# Patient Record
Sex: Male | Born: 1935 | Race: White | Hispanic: No | Marital: Married | Smoking: Never smoker
Health system: Southern US, Community
[De-identification: ages and names within clinical notes are randomized; demographics above are authoritative.]

## PROBLEM LIST (undated history)

## (undated) ENCOUNTER — Emergency Department (HOSPITAL_COMMUNITY): Admission: EM | Payer: Medicare (Managed Care) | Source: Home / Self Care

## (undated) DIAGNOSIS — K219 Gastro-esophageal reflux disease without esophagitis: Secondary | ICD-10-CM

## (undated) DIAGNOSIS — G4733 Obstructive sleep apnea (adult) (pediatric): Secondary | ICD-10-CM

## (undated) DIAGNOSIS — I10 Essential (primary) hypertension: Secondary | ICD-10-CM

## (undated) DIAGNOSIS — G20 Parkinson's disease: Secondary | ICD-10-CM

## (undated) DIAGNOSIS — F0390 Unspecified dementia without behavioral disturbance: Secondary | ICD-10-CM

## (undated) DIAGNOSIS — Z972 Presence of dental prosthetic device (complete) (partial): Secondary | ICD-10-CM

## (undated) DIAGNOSIS — B009 Herpesviral infection, unspecified: Secondary | ICD-10-CM

## (undated) DIAGNOSIS — F329 Major depressive disorder, single episode, unspecified: Secondary | ICD-10-CM

## (undated) DIAGNOSIS — R0600 Dyspnea, unspecified: Secondary | ICD-10-CM

## (undated) DIAGNOSIS — Z973 Presence of spectacles and contact lenses: Secondary | ICD-10-CM

## (undated) DIAGNOSIS — G8929 Other chronic pain: Secondary | ICD-10-CM

## (undated) DIAGNOSIS — F32A Depression, unspecified: Secondary | ICD-10-CM

## (undated) DIAGNOSIS — F419 Anxiety disorder, unspecified: Secondary | ICD-10-CM

## (undated) DIAGNOSIS — M1990 Unspecified osteoarthritis, unspecified site: Secondary | ICD-10-CM

## (undated) DIAGNOSIS — J302 Other seasonal allergic rhinitis: Secondary | ICD-10-CM

## (undated) DIAGNOSIS — K759 Inflammatory liver disease, unspecified: Secondary | ICD-10-CM

## (undated) DIAGNOSIS — G250 Essential tremor: Secondary | ICD-10-CM

## (undated) DIAGNOSIS — G709 Myoneural disorder, unspecified: Secondary | ICD-10-CM

## (undated) DIAGNOSIS — E039 Hypothyroidism, unspecified: Secondary | ICD-10-CM

## (undated) DIAGNOSIS — E7800 Pure hypercholesterolemia, unspecified: Secondary | ICD-10-CM

## (undated) DIAGNOSIS — I739 Peripheral vascular disease, unspecified: Secondary | ICD-10-CM

## (undated) HISTORY — DX: Peripheral vascular disease, unspecified: I73.9

## (undated) HISTORY — PX: TONSILLECTOMY: SUR1361

## (undated) HISTORY — PX: APPENDECTOMY: SHX54

## (undated) HISTORY — PX: RETINAL DETACHMENT SURGERY: SHX105

## (undated) HISTORY — PX: ANKLE FRACTURE SURGERY: SHX122

## (undated) HISTORY — PX: BACK SURGERY: SHX140

## (undated) HISTORY — PX: CARDIOVASCULAR STRESS TEST: SHX262

## (undated) HISTORY — DX: Obstructive sleep apnea (adult) (pediatric): G47.33

## (undated) HISTORY — DX: Hypothyroidism, unspecified: E03.9

## (undated) HISTORY — PX: CATARACT EXTRACTION: SUR2

## (undated) HISTORY — DX: Pure hypercholesterolemia, unspecified: E78.00

---

## 2010-08-30 HISTORY — PX: REPLACEMENT TOTAL KNEE: SUR1224

## 2016-02-08 ENCOUNTER — Emergency Department (HOSPITAL_BASED_OUTPATIENT_CLINIC_OR_DEPARTMENT_OTHER): Payer: Medicare Other

## 2016-02-08 ENCOUNTER — Inpatient Hospital Stay (HOSPITAL_BASED_OUTPATIENT_CLINIC_OR_DEPARTMENT_OTHER)
Admission: EM | Admit: 2016-02-08 | Discharge: 2016-02-11 | DRG: 603 | Disposition: A | Discharge status: Home / Self Care | Payer: Medicare Other | Attending: Internal Medicine | Admitting: Internal Medicine

## 2016-02-08 ENCOUNTER — Encounter (HOSPITAL_BASED_OUTPATIENT_CLINIC_OR_DEPARTMENT_OTHER): Payer: Self-pay | Admitting: *Deleted

## 2016-02-08 DIAGNOSIS — I10 Essential (primary) hypertension: Secondary | ICD-10-CM

## 2016-02-08 DIAGNOSIS — Z79899 Other long term (current) drug therapy: Secondary | ICD-10-CM

## 2016-02-08 DIAGNOSIS — F03918 Unspecified dementia, unspecified severity, with other behavioral disturbance: Secondary | ICD-10-CM | POA: Diagnosis present

## 2016-02-08 DIAGNOSIS — G3183 Dementia with Lewy bodies: Secondary | ICD-10-CM

## 2016-02-08 DIAGNOSIS — G20 Parkinson's disease: Secondary | ICD-10-CM | POA: Diagnosis present

## 2016-02-08 DIAGNOSIS — K047 Periapical abscess without sinus: Secondary | ICD-10-CM

## 2016-02-08 DIAGNOSIS — K589 Irritable bowel syndrome without diarrhea: Secondary | ICD-10-CM | POA: Diagnosis present

## 2016-02-08 DIAGNOSIS — G250 Essential tremor: Secondary | ICD-10-CM | POA: Diagnosis present

## 2016-02-08 DIAGNOSIS — F0280 Dementia in other diseases classified elsewhere without behavioral disturbance: Secondary | ICD-10-CM

## 2016-02-08 DIAGNOSIS — L03211 Cellulitis of face: Principal | ICD-10-CM | POA: Diagnosis present

## 2016-02-08 DIAGNOSIS — F0391 Unspecified dementia with behavioral disturbance: Secondary | ICD-10-CM | POA: Diagnosis present

## 2016-02-08 DIAGNOSIS — L0291 Cutaneous abscess, unspecified: Secondary | ICD-10-CM

## 2016-02-08 DIAGNOSIS — L0390 Cellulitis, unspecified: Secondary | ICD-10-CM

## 2016-02-08 HISTORY — DX: Unspecified dementia, unspecified severity, without behavioral disturbance, psychotic disturbance, mood disturbance, and anxiety: F03.90

## 2016-02-08 HISTORY — DX: Essential (primary) hypertension: I10

## 2016-02-08 HISTORY — DX: Unspecified osteoarthritis, unspecified site: M19.90

## 2016-02-08 HISTORY — DX: Parkinson's disease: G20

## 2016-02-08 LAB — CBC WITH DIFFERENTIAL/PLATELET
Basophils Absolute: 0 10*3/uL (ref 0.0–0.1)
Basophils Relative: 0 %
Eosinophils Absolute: 0.2 10*3/uL (ref 0.0–0.7)
Eosinophils Relative: 2 %
HCT: 39.1 % (ref 39.0–52.0)
Hemoglobin: 13.6 g/dL (ref 13.0–17.0)
Lymphocytes Relative: 20 %
Lymphs Abs: 1.7 10*3/uL (ref 0.7–4.0)
MCH: 33.3 pg (ref 26.0–34.0)
MCHC: 34.8 g/dL (ref 30.0–36.0)
MCV: 95.6 fL (ref 78.0–100.0)
Monocytes Absolute: 1.3 10*3/uL — ABNORMAL HIGH (ref 0.1–1.0)
Monocytes Relative: 16 %
Neutro Abs: 5.2 10*3/uL (ref 1.7–7.7)
Neutrophils Relative %: 62 %
Platelets: 194 10*3/uL (ref 150–400)
RBC: 4.09 MIL/uL — ABNORMAL LOW (ref 4.22–5.81)
RDW: 13 % (ref 11.5–15.5)
WBC: 8.4 10*3/uL (ref 4.0–10.5)

## 2016-02-08 LAB — BASIC METABOLIC PANEL
Anion gap: 8 (ref 5–15)
BUN: 12 mg/dL (ref 6–20)
CO2: 27 mmol/L (ref 22–32)
Calcium: 8.8 mg/dL — ABNORMAL LOW (ref 8.9–10.3)
Chloride: 100 mmol/L — ABNORMAL LOW (ref 101–111)
Creatinine, Ser: 0.93 mg/dL (ref 0.61–1.24)
GFR calc Af Amer: >60 mL/min (ref >60)
GFR calc non Af Amer: >60 mL/min (ref >60)
Glucose, Bld: 107 mg/dL — ABNORMAL HIGH (ref 65–99)
Potassium: 3.6 mmol/L (ref 3.5–5.1)
Sodium: 135 mmol/L (ref 135–145)

## 2016-02-08 MED ORDER — ACETAMINOPHEN 325 MG PO TABS
650.0000 mg | ORAL_TABLET | Freq: Once | ORAL | Status: AC
  Administered 2016-02-08: 650 mg via ORAL
  Filled 2016-02-08: qty 2

## 2016-02-08 MED ORDER — SODIUM CHLORIDE 0.9 % IV BOLUS (SEPSIS)
500.0000 mL | Freq: Once | INTRAVENOUS | Status: AC
Start: 2016-02-08 — End: 2016-02-08
  Administered 2016-02-08: 500 mL via INTRAVENOUS

## 2016-02-08 MED ORDER — IOPAMIDOL (ISOVUE-300) INJECTION 61%
100.0000 mL | Freq: Once | INTRAVENOUS | Status: AC | PRN
  Administered 2016-02-08: 100 mL via INTRAVENOUS

## 2016-02-08 MED ORDER — CLINDAMYCIN PHOSPHATE 600 MG/50ML IV SOLN
600.0000 mg | Freq: Once | INTRAVENOUS | Status: AC
  Administered 2016-02-08: 600 mg via INTRAVENOUS
  Filled 2016-02-08: qty 50

## 2016-02-08 NOTE — ED Notes (Signed)
Patient transported to CT 

## 2016-02-08 NOTE — ED Provider Notes (Signed)
CSN: [id number]     Arrival date & time 02/08/16  1624 History  By signing my name below, I, Kenneth Brandt, attest that this documentation has been prepared under the direction and in the presence of [name], MD. Electronically Signed: Soijett Brandt, ED Scribe. 02/08/2016. 5:27 PM.   Chief Complaint  Patient presents with  . Dental Pain      The history is provided by the patient. No language interpreter was used.    Kenneth Brandt is a 81 y.o. male with a medical hx of parkinson dx, dementia, HTN, who presents to the Emergency Department complaining of left lower dental pain onset 2 days. Family reports that for the past couple days the pt has been stating that he doesn't feel well. Denies dental pain like this in the past. Denies any injury or trauma at this time. Pt does have a dentist and was informed that he would need a root canal to his left upper tooth, with his last visit being several weeks ago. He states that he is having associated symptoms of left sided facial swelling and left lower gum swelling/redness. He states that has not tried any medications for the relief for his symptoms. He denies fever, trouble swallowing, SOB, and any other symptoms. Pt denies allergies to any medications.   Past Medical History  Diagnosis Date  . Parkinson disease (HCC)   . Dementia   . Hypertension   . Arthritis    Past Surgical History  Procedure Laterality Date  . Back surgery     Family History  Problem Relation Age of Onset  . Cancer Mother   . CAD Father   . CAD Brother   . Cancer Other   . Stroke Neg Hx    Social History  Substance Use Topics  . Smoking status: Never Smoker   . Smokeless tobacco: None  . Alcohol Use: No    Review of Systems  Constitutional: Negative for fever.  HENT: Positive for dental problem. Negative for trouble swallowing.        Left lower gum swelling/redness  Respiratory: Negative for shortness of breath.   All other systems reviewed and  are negative.   Allergies  Review of patient's allergies indicates no known allergies.  Home Medications   Prior to Admission medications   Medication Sig Start Date End Date Taking? Authorizing Provider  aspirin 81 MG tablet Take 81 mg by mouth daily.   Yes Historical Provider, MD  buPROPion (WELLBUTRIN XL) 150 MG 24 hr tablet Take 150 mg by mouth daily.   Yes Historical Provider, MD  carbidopa-levodopa (SINEMET IR) 10-100 MG tablet Take 1.5 tablets by mouth 3 (three) times daily.   Yes Historical Provider, MD  donepezil (ARICEPT) 10 MG tablet Take 10 mg by mouth at bedtime.   Yes Historical Provider, MD  DULoxetine (CYMBALTA) 60 MG capsule Take 60 mg by mouth 2 (two) times daily.   Yes Historical Provider, MD  hyoscyamine (LEVSIN, ANASPAZ) 0.125 MG tablet Take 0.125 mg by mouth every 6 (six) hours as needed for cramping.   Yes Historical Provider, MD  levothyroxine (SYNTHROID, LEVOTHROID) 75 MCG tablet Take 75 mcg by mouth daily before breakfast.   Yes Historical Provider, MD  meloxicam (MOBIC) 7.5 MG tablet Take 7.5 mg by mouth 2 (two) times daily.   Yes Historical Provider, MD  cyclobenzaprine (FLEXERIL) 10 MG tablet Take 10 mg by mouth 3 (three) times daily as needed for muscle spasms.   Yes Historical Provider, MD  gabapentin (NEURONTIN) 300 MG capsule Take 300 mg by mouth 3 (three) times daily.   Yes Historical Provider, MD  hydrochlorothiazide (HYDRODIURIL) 25 MG tablet Take 25 mg by mouth daily.   Yes Historical Provider, MD  UNKNOWN TO PATIENT    Yes Historical Provider, MD   BP 137/61 mmHg  Pulse 71  Temp(Src) 98.6 F (37 C) (Oral)  Resp 18  Ht 5\' 8"  (1.727 m)  Wt 239 lb 9.6 oz (108.682 kg)  BMI 36.44 kg/m2  SpO2 97% Physical Exam  Constitutional: He is oriented to person, place, and time. He appears well-developed and well-nourished.  HENT:  Head: Normocephalic and atraumatic.  Mouth/Throat:    Moderate left lower facial and jaw swelling and erythema with local  tenderness.   Cardiovascular: Normal rate, regular rhythm and normal heart sounds.   Pulmonary/Chest: Effort normal and breath sounds normal. No respiratory distress.  Abdominal: Soft. There is no tenderness. There is no rebound and no guarding.  Musculoskeletal: He exhibits no edema or tenderness.  Non-pitting edema to bilateral Le  Neurological: He is alert and oriented to person, place, and time.  Skin: Skin is warm and dry.  Psychiatric: He has a normal mood and affect. His behavior is normal.  Nursing note and vitals reviewed.   ED Course  Procedures (including critical care time) DIAGNOSTIC STUDIES: Oxygen Saturation is 95% on RA, nl by my interpretation.    COORDINATION OF CARE: 5:27 PM Discussed treatment plan with pt at bedside which includes labs, IV abx, CT maxillofacial, and pt agreed to plan.    Labs Review Labs Reviewed  BASIC METABOLIC PANEL - Abnormal; Notable for the following:    Chloride 100 (*)    Glucose, Bld 107 (*)    Calcium 8.8 (*)    All other components within normal limits  CBC WITH DIFFERENTIAL/PLATELET - Abnormal; Notable for the following:    RBC 4.09 (*)    Monocytes Absolute 1.3 (*)    All other components within normal limits    Imaging Review Ct Maxillofacial W/cm  02/08/2016  CLINICAL DATA:  Acute onset of left lower dental pain and left-sided facial swelling. Initial encounter. EXAM: CT MAXILLOFACIAL WITH CONTRAST TECHNIQUE: Multidetector CT imaging of the maxillofacial structures was performed with intravenous contrast. Multiplanar CT image reconstructions were also generated. A small metallic BB was placed on the right temple in order to reliably differentiate right from left. CONTRAST:  100mL ISOVUE-300 IOPAMIDOL (ISOVUE-300) INJECTION 61% COMPARISON:  None. FINDINGS: Mild soft tissue swelling is noted overlying the left side of the face, tracking about the left mandible, with thickening of the left platysma. There appears to be a small  associated soft tissue abscess at the left central mandible, measuring approximately 9 x 5 mm, which corresponds to a 1.1 cm periapical abscess at the left central mandible, underlying the root of the single remaining left mandibular tooth. Minimal cortical breakthrough is noted. The other remaining teeth are grossly unremarkable in appearance, without evidence of additional periapical abscess. There is chronic absence of much of the dentition. There is no evidence of fracture or dislocation. The maxilla and mandible appear intact. The nasal bone is unremarkable in appearance. There is grade 1 anterolisthesis of C3 on C4, reflecting underlying facet disease. Multilevel disc space narrowing is noted along the lower cervical spine, with scattered anterior and posterior disc osteophyte complexes. There is chronic flattening of the mandibular condylar heads bilaterally, reflecting chronic temporomandibular joint disease. Postoperative change is noted at the right  optic globe. The orbits are otherwise unremarkable. The visualized paranasal sinuses and mastoid air cells are well-aerated. Minimal calcification is noted at the right carotid bifurcation. The parapharyngeal fat planes are preserved. The nasopharynx, oropharynx and hypopharynx are unremarkable in appearance. The visualized portions of the valleculae and piriform sinuses are grossly unremarkable. The parotid and submandibular glands are within normal limits. No cervical lymphadenopathy is seen. The visualized portions of the brain are grossly unremarkable in appearance. IMPRESSION: 1. Small soft tissue abscess at the left central mandible, measuring 9 x 5 mm, corresponding to a 1.1 cm periapical abscess at the left central mandible, underlying the root of the single remaining left mandibular tooth. Minimal cortical breakthrough noted at the periapical abscess. 2. Associated mild soft tissue swelling along the left side of the face, tracking about the left  mandible, with thickening of the left platysma. 3. Mild degenerative change along the cervical spine. 4. Chronic flattening of the mandibular condylar heads bilaterally, reflecting chronic bilateral temporomandibular joint disease. 5. Minimal calcification at the right carotid bifurcation. Electronically Signed   By: Roanna Raider M.D.   On: 02/08/2016 19:11   I have personally reviewed and evaluated these images and lab results as part of my medical decision-making.   EKG Interpretation None      MDM   Final diagnoses:  Dental abscess  Facial cellulitis   Pt here for evaluation of facial pain and swelling.  He has malaise at home but no reports of fevers.  Exam with facial cellulitis and spontaneously draining dental abscess.  CT scan with similar findings.  He was started on Clindamycin in the ED.  Given extent of facial swelling and age plan to admit for IV antibiotics until symptoms improve.  Pt in agreement with plan.  Hospitalist consulted for admission.    I personally performed the services described in this documentation, which was scribed in my presence. The recorded information has been reviewed and is accurate.    [name], MD 02/09/16 1221

## 2016-02-08 NOTE — ED Notes (Signed)
Dental pain bottom left side of jaw.  Swelling and redness noted.

## 2016-02-08 NOTE — Plan of Care (Signed)
81 yo m hx of parkinson HTN 3 days of facial pain Right facial cellulitis CT showing peri-epical abscess Plant to admit for IV antibiotics.  Does not look toxic  given clindamycin 600 IV Accepted to med surge bed  Kenneth Brandt  8:50 PM

## 2016-02-08 NOTE — H&P (Signed)
Kenneth Brandt ZOX:[id number] DOB: 06/16/1935 DOA: 02/08/2016     PCP: Pcp Not In System   Outpatient Specialists: none Patient coming from:   home Lives   With family   Chief Complaint: Left side jaw pain  HPI: Kenneth Brandt is a 81 y.o. male with medical history significant of hypertension, mild dementia, Parkinson disease,  chronic diarrhea  Presented with redness and swelling of left side of her face since 3days. Patient have had toothache there was seen by dentist who felt that his 2 did not appear to be infected that time there was 2 weeks ago. He did not appreciate any drainage. No fevers some chills Feels overall unwell. His face started to swell he has not tried any medications to help him. No back trouble swallowing or shortness of breath no chest pain no nausea vomiting or diarrhea  Family noticed some lower extremity edema has been chronic. Patient has chronic diarrhea secondary to IBS he is on Imodium as needed  Regarding pertinent Chronic problems: History of hypertension   IN ER: Presented to [facility] [place] Afebrile, heart rate 71 blood pressure 3761 WBC 8.4 hemoglobin 13.6 creatinine 0.93 CT  Face showing small soft tissue abscess left central mandible corresponding to 1.1 cm. Atypical abscess in the left central mandible associated and mild left soft tissue swelling over the left side of the face  Started on clindamycin  Hospitalist was called for admission for facial cellulitis and peridental abscess  Review of Systems:    Pertinent positives include: Facial pain and swelling fatigue,  Constitutional:  No weight loss, night sweats, Fevers, chills, weight loss  HEENT:  No headaches, Difficulty swallowing,Tooth/dental problems,Sore throat,  No sneezing, itching, ear ache, nasal congestion, post nasal drip,  Cardio-vascular:  No chest pain, Orthopnea, PND, anasarca, dizziness, palpitations.no Bilateral lower extremity swelling  GI:  No heartburn,  indigestion, abdominal pain, nausea, vomiting, diarrhea, change in bowel habits, loss of appetite, melena, blood in stool, hematemesis Resp:  no shortness of breath at rest. No dyspnea on exertion, No excess mucus, no productive cough, No non-productive cough, No coughing up of blood.No change in color of mucus.No wheezing. Skin:  no rash or lesions. No jaundice GU:  no dysuria, change in color of urine, no urgency or frequency. No straining to urinate.  No flank pain.  Musculoskeletal:  No joint pain or no joint swelling. No decreased range of motion. No back pain.  Psych:  No change in mood or affect. No depression or anxiety. No memory loss.  Neuro: no localizing neurological complaints, no tingling, no weakness, no double vision, no gait abnormality, no slurred speech, no confusion  As per HPI otherwise 10 point review of systems negative.   Past Medical History: Past Medical History  Diagnosis Date  . Parkinson disease (HCC)   . Dementia   . Hypertension   . Arthritis    Past Surgical History  Procedure Laterality Date  . Back surgery       Social History:  Ambulatory   independently      reports that he has never smoked. He does not have any smokeless tobacco history on file. He reports that he does not drink alcohol or use illicit drugs.  Allergies:  No Known Allergies     Family History:    Family History  Problem Relation Age of Onset  . Cancer Mother   . CAD Father   . CAD Brother   . Cancer Other   .  Stroke Neg Hx     Medications: Prior to Admission medications   Medication Sig Start Date End Date Taking? Authorizing Provider  cyclobenzaprine (FLEXERIL) 10 MG tablet Take 10 mg by mouth 3 (three) times daily as needed for muscle spasms.   Yes Historical Provider, MD  gabapentin (NEURONTIN) 300 MG capsule Take 300 mg by mouth 3 (three) times daily.   Yes Historical Provider, MD  hydrochlorothiazide (HYDRODIURIL) 25 MG tablet Take 25 mg by mouth  daily.   Yes Historical Provider, MD  UNKNOWN TO PATIENT    Yes Historical Provider, MD    Physical Exam: Patient Vitals for the past 24 hrs:  BP Temp Temp src Pulse Resp SpO2 Height Weight  02/08/16 2256 137/61 mmHg 98.6 F (37 C) Oral 71 18 97 % 5\' 8"  (1.727 m) 108.682 kg (239 lb 9.6 oz)  02/08/16 2130 136/91 mmHg - - 70 - 95 % - -  02/08/16 2115 148/85 mmHg - - 71 - 95 % - -  02/08/16 2100 143/83 mmHg - - 69 - 94 % - -  02/08/16 2045 132/79 mmHg - - 69 - 95 % - -  02/08/16 2010 147/81 mmHg - - 75 20 95 % - -  02/08/16 1802 142/85 mmHg 98.5 F (36.9 C) Oral 70 20 94 % - -  02/08/16 1631 145/78 mmHg 99.4 F (37.4 C) Oral 82 20 95 % 5\' 8"  (1.727 m) 104.327 kg (230 lb)    1. General:  in No Acute distress 2. Psychological: Alert and   Oriented 3. Head/ENT:     Dry Mucous Membranes Swelling in the gum area and left face                          Head Non traumatic, neck supple                           Poor Dentition 4. SKIN:   decreased Skin turgor,  Skin clean Dry and intact no rash 5. Heart: Regular rate and rhythm no Murmur, Rub or gallop 6. Lungs:  Clear to auscultation bilaterally, no wheezes or crackles   7. Abdomen: Soft, non-tender, Non distended 8. Lower extremities: no clubbing, cyanosis, trace edema 9. Neurologically Grossly intact, moving all 4 extremities equally 10. MSK: Normal range of motion   body mass index is 36.44 kg/(m^2).  Labs on Admission:   Labs on Admission: I have personally reviewed following labs and imaging studies  CBC:  Recent Labs Lab 02/08/16 1745  WBC 8.4  NEUTROABS 5.2  HGB 13.6  HCT 39.1  MCV 95.6  PLT 194   Basic Metabolic Panel:  Recent Labs Lab 02/08/16 1745  NA 135  K 3.6  CL 100*  CO2 27  GLUCOSE 107*  BUN 12  CREATININE 0.93  CALCIUM 8.8*   GFR: Estimated Creatinine Clearance: 78.2 mL/min (by C-G formula based on Cr of 0.93). Liver Function Tests: No results for input(s): AST, ALT, ALKPHOS, BILITOT, PROT,  ALBUMIN in the last 168 hours. No results for input(s): LIPASE, AMYLASE in the last 168 hours. No results for input(s): AMMONIA in the last 168 hours. Coagulation Profile: No results for input(s): INR, PROTIME in the last 168 hours. Cardiac Enzymes: No results for input(s): CKTOTAL, CKMB, CKMBINDEX, TROPONINI in the last 168 hours. BNP (last 3 results) No results for input(s): PROBNP in the last 8760 hours. HbA1C: No results for input(s): HGBA1C  in the last 72 hours. CBG: No results for input(s): GLUCAP in the last 168 hours. Lipid Profile: No results for input(s): CHOL, HDL, LDLCALC, TRIG, CHOLHDL, LDLDIRECT in the last 72 hours. Thyroid Function Tests: No results for input(s): TSH, T4TOTAL, FREET4, T3FREE, THYROIDAB in the last 72 hours. Anemia Panel: No results for input(s): VITAMINB12, FOLATE, FERRITIN, TIBC, IRON, RETICCTPCT in the last 72 hours. Urine analysis: No results found for: COLORURINE, APPEARANCEUR, LABSPEC, PHURINE, GLUCOSEU, HGBUR, BILIRUBINUR, KETONESUR, PROTEINUR, UROBILINOGEN, NITRITE, LEUKOCYTESUR Sepsis Labs: @LABRCNTIP (procalcitonin:4,lacticidven:4) )No results found for this or any previous visit (from the past 240 hour(s)).   UA Not obtained  No results found for: HGBA1C  Estimated Creatinine Clearance: 78.2 mL/min (by C-G formula based on Cr of 0.93).  BNP (last 3 results) No results for input(s): PROBNP in the last 8760 hours.   ECG REPORT Not obtained  [facility] LL[facility] Weights   02/08/16 1631 02/08/16 2256  Weight: 104.327 kg (230 lb) 108.682 kg (239 lb 9.6 oz)     Cultures: No results found for: SDES, SPECREQUEST, CULT, REPTSTATUS   Radiological Exams on Admission: Ct Maxillofacial W/cm  02/08/2016  CLINICAL DATA:  Acute onset of left lower dental pain and left-sided facial swelling. Initial encounter. EXAM: CT MAXILLOFACIAL WITH CONTRAST TECHNIQUE: Multidetector CT imaging of the maxillofacial structures was performed with intravenous contrast.  Multiplanar CT image reconstructions were also generated. A small metallic BB was placed on the right temple in order to reliably differentiate right from left. CONTRAST:  100mL ISOVUE-300 IOPAMIDOL (ISOVUE-300) INJECTION 61% COMPARISON:  None. FINDINGS: Mild soft tissue swelling is noted overlying the left side of the face, tracking about the left mandible, with thickening of the left platysma. There appears to be a small associated soft tissue abscess at the left central mandible, measuring approximately 9 x 5 mm, which corresponds to a 1.1 cm periapical abscess at the left central mandible, underlying the root of the single remaining left mandibular tooth. Minimal cortical breakthrough is noted. The other remaining teeth are grossly unremarkable in appearance, without evidence of additional periapical abscess. There is chronic absence of much of the dentition. There is no evidence of fracture or dislocation. The maxilla and mandible appear intact. The nasal bone is unremarkable in appearance. There is grade 1 anterolisthesis of C3 on C4, reflecting underlying facet disease. Multilevel disc space narrowing is noted along the lower cervical spine, with scattered anterior and posterior disc osteophyte complexes. There is chronic flattening of the mandibular condylar heads bilaterally, reflecting chronic temporomandibular joint disease. Postoperative change is noted at the right optic globe. The orbits are otherwise unremarkable. The visualized paranasal sinuses and mastoid air cells are well-aerated. Minimal calcification is noted at the right carotid bifurcation. The parapharyngeal fat planes are preserved. The nasopharynx, oropharynx and hypopharynx are unremarkable in appearance. The visualized portions of the valleculae and piriform sinuses are grossly unremarkable. The parotid and submandibular glands are within normal limits. No cervical lymphadenopathy is seen. The visualized portions of the brain are grossly  unremarkable in appearance. IMPRESSION: 1. Small soft tissue abscess at the left central mandible, measuring 9 x 5 mm, corresponding to a 1.1 cm periapical abscess at the left central mandible, underlying the root of the single remaining left mandibular tooth. Minimal cortical breakthrough noted at the periapical abscess. 2. Associated mild soft tissue swelling along the left side of the face, tracking about the left mandible, with thickening of the left platysma. 3. Mild degenerative change along the cervical spine. 4. Chronic flattening of the  mandibular condylar heads bilaterally, reflecting chronic bilateral temporomandibular joint disease. 5. Minimal calcification at the right carotid bifurcation. Electronically Signed   By: Roanna Raider M.D.   On: 02/08/2016 19:11    Chart has been reviewed    Assessment/Plan   81 y.o. male with medical history significant of hypertension, mild dementia, Parkinson disease, here with peridental abscess resulting in facial cellulitis  Present on Admission:  . Facial cellulitis - continue clindamycin to cover anaerobes started an emergency department will need dental follow-up after discharge tooth extraction  Chronic diarrhea - due to IBS.stable if increase in stool quality or quantity of suspects that this would test prior to give Imodium . Dementia without  behavioral disturbance stable continue Aricept . Parkinson disease (HCC) continue home medications and unsure of the dose family will bring medications tomorrow. For now start Sinemet 10- 100 1 and 1/2 tablets. To avoid Parkinson symptoms. We'll need to adjust medications 1 patient family confirms current dose currently stable  . Essential hypertension continue hydrochlorothiazide Mild lower extremity edema no shortness of breath or orthopnea - will check BNP Medications currently stable  Other plan as per orders.  DVT prophylaxis:    Lovenox     Code Status:  FULL CODE as per patient    Family  Communication:   Family   at  Bedside  plan of care was discussed with  Daughter,   Elmo Putt [phone]  Disposition Plan:     To home once workup is complete and patient is stable   Consults called: none   Admission status:  Inpatient     Level of care   medical floor        Baylen Dea 02/09/2016, 12:44 AM    [facility]  Pager [phone]   after 2 AM please page floor coverage PA If 7AM-7PM, please contact the day team taking care of the patient  [email or web address]  Password [email or web address]

## 2016-02-08 NOTE — ED Notes (Signed)
Pt and family describe redness and swelling to left side of face since yesterday. Tender to touch. No drainage "that I'm aware of".

## 2016-02-08 NOTE — ED Notes (Signed)
No changes [facility] here.

## 2016-02-09 ENCOUNTER — Encounter (HOSPITAL_COMMUNITY): Payer: Self-pay | Admitting: Internal Medicine

## 2016-02-09 DIAGNOSIS — L03211 Cellulitis of face: Secondary | ICD-10-CM | POA: Diagnosis not present

## 2016-02-09 DIAGNOSIS — L0291 Cutaneous abscess, unspecified: Secondary | ICD-10-CM | POA: Diagnosis not present

## 2016-02-09 LAB — COMPREHENSIVE METABOLIC PANEL
ALT: 13 U/L — ABNORMAL LOW (ref 17–63)
AST: 15 U/L (ref 15–41)
Albumin: 3 g/dL — ABNORMAL LOW (ref 3.5–5.0)
Alkaline Phosphatase: 62 U/L (ref 38–126)
Anion gap: 7 (ref 5–15)
BUN: 10 mg/dL (ref 6–20)
CO2: 28 mmol/L (ref 22–32)
Calcium: 8.8 mg/dL — ABNORMAL LOW (ref 8.9–10.3)
Chloride: 101 mmol/L (ref 101–111)
Creatinine, Ser: 0.86 mg/dL (ref 0.61–1.24)
GFR calc Af Amer: >60 mL/min (ref >60)
GFR calc non Af Amer: >60 mL/min (ref >60)
Glucose, Bld: 96 mg/dL (ref 65–99)
Potassium: 3.5 mmol/L (ref 3.5–5.1)
Sodium: 136 mmol/L (ref 135–145)
Total Bilirubin: 0.7 mg/dL (ref 0.3–1.2)
Total Protein: 6.3 g/dL — ABNORMAL LOW (ref 6.5–8.1)

## 2016-02-09 LAB — CBC
HCT: 37.8 % — ABNORMAL LOW (ref 39.0–52.0)
Hemoglobin: 12.6 g/dL — ABNORMAL LOW (ref 13.0–17.0)
MCH: 32 pg (ref 26.0–34.0)
MCHC: 33.3 g/dL (ref 30.0–36.0)
MCV: 95.9 fL (ref 78.0–100.0)
Platelets: 180 10*3/uL (ref 150–400)
RBC: 3.94 MIL/uL — ABNORMAL LOW (ref 4.22–5.81)
RDW: 13.4 % (ref 11.5–15.5)
WBC: 8.3 10*3/uL (ref 4.0–10.5)

## 2016-02-09 LAB — TSH: TSH: 1.845 u[IU]/mL (ref 0.350–4.500)

## 2016-02-09 LAB — MAGNESIUM: Magnesium: 1.8 mg/dL (ref 1.7–2.4)

## 2016-02-09 LAB — PHOSPHORUS: Phosphorus: 3.4 mg/dL (ref 2.5–4.6)

## 2016-02-09 LAB — BRAIN NATRIURETIC PEPTIDE: B Natriuretic Peptide: 109.6 pg/mL — ABNORMAL HIGH (ref 0.0–100.0)

## 2016-02-09 MED ORDER — GABAPENTIN 300 MG PO CAPS
300.0000 mg | ORAL_CAPSULE | Freq: Three times a day (TID) | ORAL | Status: DC
  Administered 2016-02-09 – 2016-02-11 (×7): 300 mg via ORAL
  Filled 2016-02-09 (×7): qty 1

## 2016-02-09 MED ORDER — ACETAMINOPHEN 325 MG PO TABS: 650.0000 mg | ORAL_TABLET | Freq: Four times a day (QID) | ORAL | Status: DC | PRN

## 2016-02-09 MED ORDER — ENOXAPARIN SODIUM 40 MG/0.4ML ~~LOC~~ SOLN
40.0000 mg | SUBCUTANEOUS | Status: DC
  Administered 2016-02-09 – 2016-02-10 (×2): 40 mg via SUBCUTANEOUS
  Filled 2016-02-09 (×2): qty 0.4

## 2016-02-09 MED ORDER — ACETAMINOPHEN 650 MG RE SUPP: 650.0000 mg | Freq: Four times a day (QID) | RECTAL | Status: DC | PRN

## 2016-02-09 MED ORDER — SODIUM CHLORIDE 0.9% FLUSH: 3.0000 mL | INTRAVENOUS | Status: DC | PRN

## 2016-02-09 MED ORDER — SODIUM CHLORIDE 0.9% FLUSH
3.0000 mL | Freq: Two times a day (BID) | INTRAVENOUS | Status: DC
  Administered 2016-02-09 – 2016-02-11 (×3): 3 mL via INTRAVENOUS

## 2016-02-09 MED ORDER — HYDROCHLOROTHIAZIDE 25 MG PO TABS
25.0000 mg | ORAL_TABLET | Freq: Every day | ORAL | Status: DC
  Administered 2016-02-09 – 2016-02-11 (×3): 25 mg via ORAL
  Filled 2016-02-09 (×3): qty 1

## 2016-02-09 MED ORDER — ONDANSETRON HCL 4 MG PO TABS: 4.0000 mg | ORAL_TABLET | Freq: Four times a day (QID) | ORAL | Status: DC | PRN

## 2016-02-09 MED ORDER — BUPROPION HCL ER (XL) 150 MG PO TB24
150.0000 mg | ORAL_TABLET | Freq: Every day | ORAL | Status: DC
  Administered 2016-02-09 – 2016-02-10 (×2): 150 mg via ORAL
  Filled 2016-02-09 (×2): qty 1

## 2016-02-09 MED ORDER — CLINDAMYCIN PHOSPHATE 600 MG/50ML IV SOLN
600.0000 mg | Freq: Four times a day (QID) | INTRAVENOUS | Status: DC
  Administered 2016-02-09 – 2016-02-11 (×10): 600 mg via INTRAVENOUS
  Filled 2016-02-09 (×12): qty 50

## 2016-02-09 MED ORDER — ASPIRIN EC 81 MG PO TBEC
81.0000 mg | DELAYED_RELEASE_TABLET | Freq: Every day | ORAL | Status: DC
  Administered 2016-02-09 – 2016-02-11 (×3): 81 mg via ORAL
  Filled 2016-02-09 (×3): qty 1

## 2016-02-09 MED ORDER — ONDANSETRON HCL 4 MG/2ML IJ SOLN: 4.0000 mg | Freq: Four times a day (QID) | INTRAMUSCULAR | Status: DC | PRN

## 2016-02-09 MED ORDER — HYDROCODONE-ACETAMINOPHEN 5-325 MG PO TABS
1.0000 | ORAL_TABLET | ORAL | Status: DC | PRN
  Administered 2016-02-09 – 2016-02-11 (×10): 2 via ORAL
  Filled 2016-02-09 (×10): qty 2

## 2016-02-09 MED ORDER — CYCLOBENZAPRINE HCL 10 MG PO TABS: 10.0000 mg | ORAL_TABLET | Freq: Three times a day (TID) | ORAL | Status: DC | PRN

## 2016-02-09 MED ORDER — SODIUM CHLORIDE 0.9 % IV SOLN: 250.0000 mL | INTRAVENOUS | Status: DC | PRN

## 2016-02-09 MED ORDER — DULOXETINE HCL 60 MG PO CPEP
60.0000 mg | ORAL_CAPSULE | Freq: Two times a day (BID) | ORAL | Status: DC
  Administered 2016-02-09 – 2016-02-11 (×5): 60 mg via ORAL
  Filled 2016-02-09 (×5): qty 1

## 2016-02-09 MED ORDER — CARBIDOPA-LEVODOPA 10-100 MG PO TABS
1.5000 | ORAL_TABLET | Freq: Three times a day (TID) | ORAL | Status: DC
  Administered 2016-02-09 – 2016-02-11 (×7): 1.5 via ORAL
  Filled 2016-02-09 (×8): qty 1.5

## 2016-02-09 MED ORDER — LEVOTHYROXINE SODIUM 75 MCG PO TABS
75.0000 ug | ORAL_TABLET | Freq: Every day | ORAL | Status: DC
  Administered 2016-02-09 – 2016-02-11 (×3): 75 ug via ORAL
  Filled 2016-02-09 (×3): qty 1

## 2016-02-09 MED ORDER — DONEPEZIL HCL 10 MG PO TABS
10.0000 mg | ORAL_TABLET | Freq: Every day | ORAL | Status: DC
  Administered 2016-02-09 – 2016-02-10 (×3): 10 mg via ORAL
  Filled 2016-02-09 (×3): qty 1

## 2016-02-09 NOTE — Progress Notes (Signed)
Pt. Washed body and face

## 2016-02-09 NOTE — Progress Notes (Signed)
Offered Pt. A bath and Pt. Stated Kenneth Brandt would get one later on and as well the linens

## 2016-02-09 NOTE — Progress Notes (Signed)
PROGRESS NOTE    Kenneth Brandt  ZOX:[id number] DOB: July 24, 1935 DOA: 02/08/2016 PCP: Pcp Not In System (Confirm with patient/family/NH records and if not entered, this HAS to be entered at [facility] point of entry. "No PCP" if truly none.)   Brief Narrative: (Start on day 1 of progress note - keep it brief and live)  Left facial cellulitis associated with periodontal infection, 81 yo male, with parkinson and htn. Clinically improving cellulitis.    Assessment & Plan:   Active Problems:   Facial cellulitis   Dental abscess   Dementia with behavioral disturbance   Parkinson disease (HCC)   Essential hypertension   1. Cardiovascular. Patient hemodynamic stable, will continue antibiotic therapy with IV clindamycin, patient has remained afebrile with systolic blood pressure 136 and heart rate 71.   2. Pulmonary. Patient oxygenating well, no signs of volume overload, will continue to monitor oxymetry and give supplemental 02 per [place] to target 02 sat above 92%. CT with 9X5 mm soft tissue abscess at the left central mandible.  No signs of airway compromise. Will follow with imaging in 24 hours.   3. Nephrology. Renal function with cr at o.86 with K at 3.5 will follow on renal panel in am, avoid hypotension and nephrotoxic medications.  4. Neurology. Patient awake and alert no signs of confusion or agitation, will continue    DVT prophylaxis: (Lovenox/Heparin/SCD's/anticoagulated/None (if comfort care) Code Status: (Full/Partial - specify details) Family Communication: (Specify name, relationship & date discussed. NO "discussed with patient") Disposition Plan: (specify when and where you expect patient to be discharged). Include barriers to DC in this tab.   Consultants:     Procedures: (Don't include imaging studies which can be auto populated. Include things that cannot be auto populated i.e. Echo, Carotid and venous dopplers, Foley, Bipap, HD, tubes/drains, wound vac, central lines  etc)    Antimicrobials: (specify start and planned stop date. Auto populated tables are space occupying and do not give end dates)  clindamycin   Subjective: Right submandibular pain and erythema with mild improvement, no dysphagia or odinophagia, no nausea or vomiting. No chest pain or dyspnea. Tolerating po well.   Objective: Filed Vitals:   02/08/16 2115 02/08/16 2130 02/08/16 2256 02/09/16 0405  BP: 148/85 136/91 137/61 135/77  Pulse: 71 70 71 64  Temp:   98.6 F (37 C) 98.1 F (36.7 C)  TempSrc:   Oral Oral  Resp:   18 20  Height:    (1.727 m)   Weight:   108.682 kg (239 lb 9.6 oz)   SpO2: 95% 95% 97% 93%    Intake/Output Summary (Last 24 hours) at 02/09/16 1124 Last data filed at 02/09/16 0915  Gross per 24 hour  Intake   1060 ml  Output    600 ml  Net    460 ml   Filed Weights   02/08/16 1631 02/08/16 2256  Weight: 104.327 kg (230 lb) 108.682 kg (239 lb 9.6 oz)    Examination:  General exam: not in pain. E ENT: no conjunctival pallor or icterus. Oral mucosa moist, no thrush or ulcers. Neck. Positive edema and erythema at the right submandibular region, ill defined margins, about 5 to 7 cm in diameter, oval type lesion. Respiratory system: Clear to auscultation. Respiratory effort normal. No wheezing, rales or rhonchi Cardiovascular system: S1 & S2 heard, RRR. No JVD, murmurs, rubs, gallops or clicks. No pedal edema. Gastrointestinal system: Abdomen is nondistended, soft and nontender. No organomegaly or masses felt. Normal  bowel sounds heard. Central nervous system: Alert and oriented. No focal neurological deficits. Extremities: Symmetric 5 x 5 power. Skin: rash on the right submandibular region as above. No other rashes. Psychiatry: Judgement and insight appear normal. Mood & affect appropriate.     Data Reviewed: I have personally reviewed following labs and imaging studies  CBC:  Recent Labs Lab 02/08/16 1745 02/09/16 0420  WBC 8.4 8.3   NEUTROABS 5.2  --   HGB 13.6 12.6*  HCT 39.1 37.8*  MCV 95.6 95.9  PLT 194 180   Basic Metabolic Panel:  Recent Labs Lab 02/08/16 1745 02/09/16 0420  NA 135 136  K 3.6 3.5  CL 100* 101  CO2 27 28  GLUCOSE 107* 96  BUN 12 10  CREATININE 0.93 0.86  CALCIUM 8.8* 8.8*  MG  --  1.8  PHOS  --  3.4   GFR: Estimated Creatinine Clearance: 84.6 mL/min (by C-G formula based on Cr of 0.86). Liver Function Tests:  Recent Labs Lab 02/09/16 0420  AST 15  ALT 13*  ALKPHOS 62  BILITOT 0.7  PROT 6.3*  ALBUMIN 3.0*   No results for input(s): LIPASE, AMYLASE in the last 168 hours. No results for input(s): AMMONIA in the last 168 hours. Coagulation Profile: No results for input(s): INR, PROTIME in the last 168 hours. Cardiac Enzymes: No results for input(s): CKTOTAL, CKMB, CKMBINDEX, TROPONINI in the last 168 hours. BNP (last 3 results) No results for input(s): PROBNP in the last 8760 hours. HbA1C: No results for input(s): HGBA1C in the last 72 hours. CBG: No results for input(s): GLUCAP in the last 168 hours. Lipid Profile: No results for input(s): CHOL, HDL, LDLCALC, TRIG, CHOLHDL, LDLDIRECT in the last 72 hours. Thyroid Function Tests:  Recent Labs  02/09/16 0420  TSH 1.845   Anemia Panel: No results for input(s): VITAMINB12, FOLATE, FERRITIN, TIBC, IRON, RETICCTPCT in the last 72 hours. Sepsis Labs: No results for input(s): PROCALCITON, LATICACIDVEN in the last 168 hours.  No results found for this or any previous visit (from the past 240 hour(s)).       Radiology Studies: Ct Maxillofacial W/cm  02/08/2016  CLINICAL DATA:  Acute onset of left lower dental pain and left-sided facial swelling. Initial encounter. EXAM: CT MAXILLOFACIAL WITH CONTRAST TECHNIQUE: Multidetector CT imaging of the maxillofacial structures was performed with intravenous contrast. Multiplanar CT image reconstructions were also generated. A small metallic BB was placed on the right temple  in order to reliably differentiate right from left. CONTRAST:  100mL ISOVUE-300 IOPAMIDOL (ISOVUE-300) INJECTION 61% COMPARISON:  None. FINDINGS: Mild soft tissue swelling is noted overlying the left side of the face, tracking about the left mandible, with thickening of the left platysma. There appears to be a small associated soft tissue abscess at the left central mandible, measuring approximately 9 x 5 mm, which corresponds to a 1.1 cm periapical abscess at the left central mandible, underlying the root of the single remaining left mandibular tooth. Minimal cortical breakthrough is noted. The other remaining teeth are grossly unremarkable in appearance, without evidence of additional periapical abscess. There is chronic absence of much of the dentition. There is no evidence of fracture or dislocation. The maxilla and mandible appear intact. The nasal bone is unremarkable in appearance. There is grade 1 anterolisthesis of C3 on C4, reflecting underlying facet disease. Multilevel disc space narrowing is noted along the lower cervical spine, with scattered anterior and posterior disc osteophyte complexes. There is chronic flattening of the mandibular condylar  heads bilaterally, reflecting chronic temporomandibular joint disease. Postoperative change is noted at the right optic globe. The orbits are otherwise unremarkable. The visualized paranasal sinuses and mastoid air cells are well-aerated. Minimal calcification is noted at the right carotid bifurcation. The parapharyngeal fat planes are preserved. The nasopharynx, oropharynx and hypopharynx are unremarkable in appearance. The visualized portions of the valleculae and piriform sinuses are grossly unremarkable. The parotid and submandibular glands are within normal limits. No cervical lymphadenopathy is seen. The visualized portions of the brain are grossly unremarkable in appearance. IMPRESSION: 1. Small soft tissue abscess at the left central mandible, measuring  9 x 5 mm, corresponding to a 1.1 cm periapical abscess at the left central mandible, underlying the root of the single remaining left mandibular tooth. Minimal cortical breakthrough noted at the periapical abscess. 2. Associated mild soft tissue swelling along the left side of the face, tracking about the left mandible, with thickening of the left platysma. 3. Mild degenerative change along the cervical spine. 4. Chronic flattening of the mandibular condylar heads bilaterally, reflecting chronic bilateral temporomandibular joint disease. 5. Minimal calcification at the right carotid bifurcation. Electronically Signed   By: Roanna Raider M.D.   On: 02/08/2016 19:11        Scheduled Meds: . aspirin EC  81 mg Oral Daily  . buPROPion  150 mg Oral Daily  . carbidopa-levodopa  1.5 tablet Oral TID  . clindamycin (CLEOCIN) IV  600 mg Intravenous Q6H  . donepezil  10 mg Oral QHS  . DULoxetine  60 mg Oral BID  . enoxaparin (LOVENOX) injection  40 mg Subcutaneous Q24H  . gabapentin  300 mg Oral TID  . hydrochlorothiazide  25 mg Oral Daily  . levothyroxine  75 mcg Oral QAC breakfast  . sodium chloride flush  3 mL Intravenous Q12H   Continuous Infusions:    LOS: 1 day        Allyna Pittsley Annett Gula, MD [facility] Pager [phone]  If 7PM-7AM, please contact night-coverage [email or web address] Password [email or web address] 02/09/2016, 11:24 AM

## 2016-02-10 ENCOUNTER — Inpatient Hospital Stay (HOSPITAL_COMMUNITY): Payer: Medicare Other

## 2016-02-10 DIAGNOSIS — L03211 Cellulitis of face: Secondary | ICD-10-CM | POA: Diagnosis not present

## 2016-02-10 LAB — CBC WITH DIFFERENTIAL/PLATELET
Basophils Absolute: 0 10*3/uL (ref 0.0–0.1)
Basophils Relative: 0 %
Eosinophils Absolute: 0.2 10*3/uL (ref 0.0–0.7)
Eosinophils Relative: 3 %
HCT: 39.6 % (ref 39.0–52.0)
Hemoglobin: 13.1 g/dL (ref 13.0–17.0)
Lymphocytes Relative: 21 %
Lymphs Abs: 1.7 10*3/uL (ref 0.7–4.0)
MCH: 31.6 pg (ref 26.0–34.0)
MCHC: 33.1 g/dL (ref 30.0–36.0)
MCV: 95.7 fL (ref 78.0–100.0)
Monocytes Absolute: 0.7 10*3/uL (ref 0.1–1.0)
Monocytes Relative: 9 %
Neutro Abs: 5.5 10*3/uL (ref 1.7–7.7)
Neutrophils Relative %: 67 %
Platelets: 187 10*3/uL (ref 150–400)
RBC: 4.14 MIL/uL — ABNORMAL LOW (ref 4.22–5.81)
RDW: 13 % (ref 11.5–15.5)
WBC: 8.1 10*3/uL (ref 4.0–10.5)

## 2016-02-10 LAB — HEMOGLOBIN A1C
Hgb A1c MFr Bld: 5.9 % — ABNORMAL HIGH (ref 4.8–5.6)
Mean Plasma Glucose: 123 mg/dL

## 2016-02-10 NOTE — Progress Notes (Signed)
PROGRESS NOTE    Kenneth Brandt  ZOX:[id number] DOB: 30-Jul-1935 DOA: 02/08/2016 PCP: Pcp Not In System (Confirm with patient/family/NH records and if not entered, this HAS to be entered at [facility] point of entry. "No PCP" if truly none.)   Brief Narrative: (Start on day 1 of progress note - keep it brief and live) Left facial cellulitis associated with periodontal infection, 81 yo male, with parkinson and htn. Clinically improving cellulitis.    Assessment & Plan:   Active Problems:   Facial cellulitis   Dental abscess   Dementia with behavioral disturbance   Parkinson disease (HCC)   Essential hypertension   1. Cardiovascular. Patient has bee afebrile, will continue IV clindamycin, clinically rash and edema improving, will follow on [place] to rule out any worsening of soft tissue 5 mm abscess. Will follow with cell count, and temp. Curve.  2. Pulmonary. Will continue to monitor oxymetry, continue supplemental 02 as needed, no signs of upper airway compromise.   3. Nephrology. Patient tolerating po well.   4. Neurology. Patient awake and alert no signs of confusion or agitation, will continue     DVT prophylaxis: (Lovenox/Heparin/SCD's/anticoagulated/None (if comfort care) Code Status: (Full/Partial - specify details) Family Communication: (Specify name, relationship & date discussed. NO "discussed with patient") Disposition Plan: (specify when and where you expect patient to be discharged). Include barriers to DC in this tab.   Consultants:    Procedures: (Don't include imaging studies which can be auto populated. Include things that cannot be auto populated i.e. Echo, Carotid and venous dopplers, Foley, Bipap, HD, tubes/drains, wound vac, central lines etc)    Antimicrobials: (specify start and planned stop date. Auto populated tables are space occupying and do not give end dates)  Clindamycin     Subjective: Persistent pain on the left jaw but improved in intensity,  dull in nature with no radiation, associated erythema improved, no nausea or vomiting.   Objective: Filed Vitals:   02/09/16 0405 02/09/16 1435 02/09/16 2113 02/10/16 0455  BP: 135/77 133/76 133/74 128/70  Pulse: 64 78 65 66  Temp: 98.1 F (36.7 C) 98.5 F (36.9 C) 98.1 F (36.7 C) 98.4 F (36.9 C)  TempSrc: Oral Oral Oral Oral  Resp: 20 18 18 20   Height:      Weight:      SpO2: 93% 97% 96% 97%    Intake/Output Summary (Last 24 hours) at 02/10/16 0803 Last data filed at 02/10/16 0500  Gross per 24 hour  Intake   1040 ml  Output    675 ml  Net    365 ml   Filed Weights   02/08/16 1631 02/08/16 2256  Weight: 104.327 kg (230 lb) 108.682 kg (239 lb 9.6 oz)    Examination:  General exam: not in dyspnea or pain E ENT: no conjunctival pallor, oral mucosa moist. Neck. Improved erythema with ill defined margins, edema at the angle of the left mandible has improved as well, no lymphadenopathy palpable. Respiratory system: No rhonchi, wheezing or rales. Respiratory effort normal. Cardiovascular system: S1 & S2 heard, RRR. No JVD, murmurs, rubs, gallops or clicks. No pedal edema. Gastrointestinal system: Abdomen is nondistended, soft and nontender. No organomegaly or masses felt. Normal bowel sounds heard. Central nervous system: Alert and oriented. No focal neurological deficits. Extremities: Symmetric 5 x 5 power. Skin: left neck erythema improved, less intense, no increase local temperature or skin wounds. Psychiatry: Judgement and insight appear normal. Mood & affect appropriate.     Data Reviewed:  I have personally reviewed following labs and imaging studies  CBC:  Recent Labs Lab 02/08/16 1745 02/09/16 0420  WBC 8.4 8.3  NEUTROABS 5.2  --   HGB 13.6 12.6*  HCT 39.1 37.8*  MCV 95.6 95.9  PLT 194 180   Basic Metabolic Panel:  Recent Labs Lab 02/08/16 1745 02/09/16 0420  NA 135 136  K 3.6 3.5  CL 100* 101  CO2 27 28  GLUCOSE 107* 96  BUN 12 10    CREATININE 0.93 0.86  CALCIUM 8.8* 8.8*  MG  --  1.8  PHOS  --  3.4   GFR: Estimated Creatinine Clearance: 84.6 mL/min (by C-G formula based on Cr of 0.86). Liver Function Tests:  Recent Labs Lab 02/09/16 0420  AST 15  ALT 13*  ALKPHOS 62  BILITOT 0.7  PROT 6.3*  ALBUMIN 3.0*   No results for input(s): LIPASE, AMYLASE in the last 168 hours. No results for input(s): AMMONIA in the last 168 hours. Coagulation Profile: No results for input(s): INR, PROTIME in the last 168 hours. Cardiac Enzymes: No results for input(s): CKTOTAL, CKMB, CKMBINDEX, TROPONINI in the last 168 hours. BNP (last 3 results) No results for input(s): PROBNP in the last 8760 hours. HbA1C:  Recent Labs  02/09/16 0420  HGBA1C 5.9*   CBG: No results for input(s): GLUCAP in the last 168 hours. Lipid Profile: No results for input(s): CHOL, HDL, LDLCALC, TRIG, CHOLHDL, LDLDIRECT in the last 72 hours. Thyroid Function Tests:  Recent Labs  02/09/16 0420  TSH 1.845   Anemia Panel: No results for input(s): VITAMINB12, FOLATE, FERRITIN, TIBC, IRON, RETICCTPCT in the last 72 hours. Sepsis Labs: No results for input(s): PROCALCITON, LATICACIDVEN in the last 168 hours.  No results found for this or any previous visit (from the past 240 hour(s)).       Radiology Studies: Ct Maxillofacial W/cm  02/08/2016  CLINICAL DATA:  Acute onset of left lower dental pain and left-sided facial swelling. Initial encounter. EXAM: CT MAXILLOFACIAL WITH CONTRAST TECHNIQUE: Multidetector CT imaging of the maxillofacial structures was performed with intravenous contrast. Multiplanar CT image reconstructions were also generated. A small metallic BB was placed on the right temple in order to reliably differentiate right from left. CONTRAST:  ISOVUE-300 IOPAMIDOL (ISOVUE-300) INJECTION 61% COMPARISON:  None. FINDINGS: Mild soft tissue swelling is noted overlying the left side of the face, tracking about the left  mandible, with thickening of the left platysma. There appears to be a small associated soft tissue abscess at the left central mandible, measuring approximately 9 x 5 mm, which corresponds to a 1.1 cm periapical abscess at the left central mandible, underlying the root of the single remaining left mandibular tooth. Minimal cortical breakthrough is noted. The other remaining teeth are grossly unremarkable in appearance, without evidence of additional periapical abscess. There is chronic absence of much of the dentition. There is no evidence of fracture or dislocation. The maxilla and mandible appear intact. The nasal bone is unremarkable in appearance. There is grade 1 anterolisthesis of C3 on C4, reflecting underlying facet disease. Multilevel disc space narrowing is noted along the lower cervical spine, with scattered anterior and posterior disc osteophyte complexes. There is chronic flattening of the mandibular condylar heads bilaterally, reflecting chronic temporomandibular joint disease. Postoperative change is noted at the right optic globe. The orbits are otherwise unremarkable. The visualized paranasal sinuses and mastoid air cells are well-aerated. Minimal calcification is noted at the right carotid bifurcation. The parapharyngeal fat planes are  preserved. The nasopharynx, oropharynx and hypopharynx are unremarkable in appearance. The visualized portions of the valleculae and piriform sinuses are grossly unremarkable. The parotid and submandibular glands are within normal limits. No cervical lymphadenopathy is seen. The visualized portions of the brain are grossly unremarkable in appearance. IMPRESSION: 1. Small soft tissue abscess at the left central mandible, measuring 9 x 5 mm, corresponding to a 1.1 cm periapical abscess at the left central mandible, underlying the root of the single remaining left mandibular tooth. Minimal cortical breakthrough noted at the periapical abscess. 2. Associated mild soft  tissue swelling along the left side of the face, tracking about the left mandible, with thickening of the left platysma. 3. Mild degenerative change along the cervical spine. 4. Chronic flattening of the mandibular condylar heads bilaterally, reflecting chronic bilateral temporomandibular joint disease. 5. Minimal calcification at the right carotid bifurcation. Electronically Signed   By: Roanna RaiderJeffery  Chang M.D.   On: 02/08/2016 19:11        Scheduled Meds: . aspirin EC  81 mg Oral Daily  . buPROPion  150 mg Oral Daily  . carbidopa-levodopa  1.5 tablet Oral TID  . clindamycin (CLEOCIN) IV  600 mg Intravenous Q6H  . donepezil  10 mg Oral QHS  . DULoxetine  60 mg Oral BID  . enoxaparin (LOVENOX) injection  40 mg Subcutaneous Q24H  . gabapentin  300 mg Oral TID  . hydrochlorothiazide  25 mg Oral Daily  . levothyroxine  75 mcg Oral QAC breakfast  . sodium chloride flush  3 mL Intravenous Q12H   Continuous Infusions:    LOS: 2 days       Mauricio Annett Gulaaniel Arrien, MD [facility] Pager [phone]  If 7PM-7AM, please contact night-coverage [email or web address] Password [email or web address] 02/10/2016, 8:03 AM

## 2016-02-10 NOTE — Care Management Important Message (Signed)
Important Message  Patient Details  Name: Phillips Climeshillip Fein MRN: [id number] Date of Birth: 1934-12-29   Medicare Important Message Given:  Yes    Bernadette HoitShoffner, [place] Grindle Coleman 02/10/2016, 8:56 AM

## 2016-02-11 DIAGNOSIS — L0291 Cutaneous abscess, unspecified: Secondary | ICD-10-CM

## 2016-02-11 DIAGNOSIS — L03211 Cellulitis of face: Secondary | ICD-10-CM | POA: Diagnosis not present

## 2016-02-11 LAB — CBC WITH DIFFERENTIAL/PLATELET
Basophils Absolute: 0 10*3/uL (ref 0.0–0.1)
Basophils Relative: 0 %
Eosinophils Absolute: 0.3 10*3/uL (ref 0.0–0.7)
Eosinophils Relative: 5 %
HCT: 38.1 % — ABNORMAL LOW (ref 39.0–52.0)
Hemoglobin: 13 g/dL (ref 13.0–17.0)
Lymphocytes Relative: 35 %
Lymphs Abs: 2.2 10*3/uL (ref 0.7–4.0)
MCH: 31.9 pg (ref 26.0–34.0)
MCHC: 34.1 g/dL (ref 30.0–36.0)
MCV: 93.4 fL (ref 78.0–100.0)
Monocytes Absolute: 1 10*3/uL (ref 0.1–1.0)
Monocytes Relative: 15 %
Neutro Abs: 2.9 10*3/uL (ref 1.7–7.7)
Neutrophils Relative %: 45 %
Platelets: 220 10*3/uL (ref 150–400)
RBC: 4.08 MIL/uL — ABNORMAL LOW (ref 4.22–5.81)
RDW: 12.8 % (ref 11.5–15.5)
WBC: 6.3 10*3/uL (ref 4.0–10.5)

## 2016-02-11 MED ORDER — ACETAMINOPHEN 500 MG PO TABS: 500.0000 mg | ORAL_TABLET | Freq: Three times a day (TID) | ORAL | Status: DC | PRN

## 2016-02-11 NOTE — Progress Notes (Signed)
Discharge papers gone over with pt. No questions/complaints. IV taken out. Pt. Waiting for ride and will let us know when they get here.

## 2016-02-11 NOTE — Discharge Instructions (Signed)
Please follow up with dentist of your choice to address tooth infection.

## 2016-02-11 NOTE — Discharge Summary (Signed)
Phillips Climeshillip Maready, is a 81 y.o. male  DOB 09-15-1934  MRN [id number].  Admission date:  02/08/2016  Admitting Physician  Therisa DoyneAnastassia Doutova, MD  Discharge Date:  02/11/2016   Primary MD  Pcp Not In System  Recommendations for primary care physician for things to follow:   Patient is discharged home with instructions to follow-up with his primary care physician within 7 days, patient has been strongly reinforce about going to his dentist to address his tooth infection. Patient's nonsteroidal anti-with her agents were discontinued and patient was advised take acetaminophen for pain control.   Admission Diagnosis  Dental abscess [K04.7] Facial cellulitis [L03.211]   Discharge Diagnosis  Dental abscess [K04.7] Facial cellulitis [L03.211]    Active Problems:   Facial cellulitis   Dental abscess   Dementia with behavioral disturbance   Parkinson disease (HCC)   Essential hypertension  Soft tissue abscess at the left central mandible measuring 95 mm 1.1 cm periapical abscess at the left central mandible, underlying the root of the single remaining left mandibular tooth.  Past Medical History  Diagnosis Date  . Parkinson disease (HCC)   . Dementia   . Hypertension   . Arthritis     Past Surgical History  Procedure Laterality Date  . Back surgery         HPI  from the history and physical done on the day of admission:     This is a 81 year old male who presents for hospital with chief complaint of left side jaw pain. For last 2 weeks he has developed a toothache that has progressively gotten worse to the point where his face develop edema and erythema. On initial physical examination his temperature was 98.6, his heart rate was 71, his respiratory rate was 18, saturation 97%, the left neck at the site of the jaw was edematous, erythematous and tender to palpation. His white cell count was 8.4, his  hemoglobin 13.6, sodium 135, potassium 3.6, creatinine 0.93. A CT of his neck showed a small soft tissue abscess in the left central mandible measuring 95 mm corresponding to a 1.1 cm periapical abscess at the left central mandible, underlying the root of the single remaining left mandibular tooth.  Patient was admitted to the hospital with the working diagnosis of facial cellulitis complicated by small soft tissue abscess due to periodontal disease.   Hospital Course:    1. Cardiovascular. Patient remained hemodynamically stable, he was placed on IV clindamycin with improvement of his symptoms. A follow-up imaging of his neck showed persistent subcentimeter abscess. Hydrochlorothiazide was continued for blood pressure control.  2. Pulmonary. Soft tissue cellulitis at the level of the neck, with no signs of airway compromise. Patient had oximetry monitor and supplemental oxygen as needed. Follow-up ultrasonography of the soft tissue at the level of the neck, showed a very small complex fluid collection adjacent to the periosteum of the mandible anteriorly measuring 0.8 cm.   3. Nephrology. Patient kidney function remained stable.  4. Neurology. No agitation or confusion. Patient was continued  on his bupropion, Sinemet, donepezil, duloxetine, gabapentin.  5. Endocrinology. Patient was continued on levothyroxine. His home regimen. Patient's serum glucose range between 96 and 107.   Discharge Condition: Stable  Follow UP  Dentist, to follow-up on tooth infection  Follow-up Information    Follow up with Primary Care at the [facility] In 1 week.       Consults obtained -   Diet and Activity recommendation: See Discharge Instructions below  Discharge Instructions    Patient was instructed to continue antibiotic therapy, follow up with his dentist to address his tooth infection.   Discharge Medications       Medication List    STOP taking these medications        ibuprofen 400 MG tablet   Commonly known as:  ADVIL,MOTRIN     meloxicam 7.5 MG tablet  Commonly known as:  MOBIC      TAKE these medications        acetaminophen 500 MG tablet  Commonly known as:  TYLENOL  Take 1 tablet (500 mg total) by mouth every 8 (eight) hours as needed (pain).     aspirin 81 MG tablet  Take 81 mg by mouth daily.     buPROPion 150 MG 24 hr tablet  Commonly known as:  WELLBUTRIN XL  Take 150 mg by mouth daily.     carbidopa-levodopa 10-100 MG tablet  Commonly known as:  SINEMET IR  Take 1.5 tablets by mouth 3 (three) times daily.     cyclobenzaprine 10 MG tablet  Commonly known as:  FLEXERIL  Take 10 mg by mouth 3 (three) times daily as needed for muscle spasms.     donepezil 10 MG tablet  Commonly known as:  ARICEPT  Take 10 mg by mouth at bedtime.     DULoxetine 60 MG capsule  Commonly known as:  CYMBALTA  Take 60 mg by mouth 2 (two) times daily.     gabapentin 300 MG capsule  Commonly known as:  NEURONTIN  Take 300 mg by mouth 3 (three) times daily.     hydrochlorothiazide 25 MG tablet  Commonly known as:  HYDRODIURIL  Take 25 mg by mouth daily.     hyoscyamine 0.125 MG tablet  Commonly known as:  LEVSIN, ANASPAZ  Take 0.125 mg by mouth every 6 (six) hours as needed for cramping.     levothyroxine 75 MCG tablet  Commonly known as:  SYNTHROID, LEVOTHROID  Take 75 mcg by mouth daily before breakfast.     loperamide 2 MG capsule  Commonly known as:  IMODIUM  Take 2 mg by mouth as needed for diarrhea or loose stools.        Major procedures and Radiology Reports - PLEASE review detailed and final reports for all details, in brief -     Ct Maxillofacial W/cm  02/08/2016  CLINICAL DATA:  Acute onset of left lower dental pain and left-sided facial swelling. Initial encounter. EXAM: CT MAXILLOFACIAL WITH CONTRAST TECHNIQUE: Multidetector CT imaging of the maxillofacial structures was performed with intravenous contrast. Multiplanar CT image reconstructions were  also generated. A small metallic BB was placed on the right temple in order to reliably differentiate right from left. CONTRAST:  ISOVUE-300 IOPAMIDOL (ISOVUE-300) INJECTION 61% COMPARISON:  None. FINDINGS: Mild soft tissue swelling is noted overlying the left side of the face, tracking about the left mandible, with thickening of the left platysma. There appears to be a small associated soft tissue abscess at the left  central mandible, measuring approximately 9 x 5 mm, which corresponds to a 1.1 cm periapical abscess at the left central mandible, underlying the root of the single remaining left mandibular tooth. Minimal cortical breakthrough is noted. The other remaining teeth are grossly unremarkable in appearance, without evidence of additional periapical abscess. There is chronic absence of much of the dentition. There is no evidence of fracture or dislocation. The maxilla and mandible appear intact. The nasal bone is unremarkable in appearance. There is grade 1 anterolisthesis of C3 on C4, reflecting underlying facet disease. Multilevel disc space narrowing is noted along the lower cervical spine, with scattered anterior and posterior disc osteophyte complexes. There is chronic flattening of the mandibular condylar heads bilaterally, reflecting chronic temporomandibular joint disease. Postoperative change is noted at the right optic globe. The orbits are otherwise unremarkable. The visualized paranasal sinuses and mastoid air cells are well-aerated. Minimal calcification is noted at the right carotid bifurcation. The parapharyngeal fat planes are preserved. The nasopharynx, oropharynx and hypopharynx are unremarkable in appearance. The visualized portions of the valleculae and piriform sinuses are grossly unremarkable. The parotid and submandibular glands are within normal limits. No cervical lymphadenopathy is seen. The visualized portions of the brain are grossly unremarkable in appearance. IMPRESSION:  1. Small soft tissue abscess at the left central mandible, measuring 9 x 5 mm, corresponding to a 1.1 cm periapical abscess at the left central mandible, underlying the root of the single remaining left mandibular tooth. Minimal cortical breakthrough noted at the periapical abscess. 2. Associated mild soft tissue swelling along the left side of the face, tracking about the left mandible, with thickening of the left platysma. 3. Mild degenerative change along the cervical spine. 4. Chronic flattening of the mandibular condylar heads bilaterally, reflecting chronic bilateral temporomandibular joint disease. 5. Minimal calcification at the right carotid bifurcation. Electronically Signed   By: Roanna Raider M.D.   On: 02/08/2016 19:11   US Soft Tissue Neck  02/10/2016  CLINICAL DATA:  Facial abscess EXAM: ULTRASOUND OF HEAD/NECK SOFT TISSUES TECHNIQUE: Ultrasound examination of the head and neck soft tissues was performed in the area of clinical concern. COMPARISON:  CT neck 2 days ago FINDINGS: Imaging over the anterior mandible to the left of midline demonstrates a small complex fluid collection adjacent to the periosteum of the mandible. It is discoid in shape and measures 0.8 x 0.7 x 0.3 cm. This is similar in in size to the abnormality noted on CT. There is edema of the overlying soft tissues. Normal-appearing adjacent lymph nodes are imaged. IMPRESSION: There is a very small complex fluid collection adjacent to the periosteum of the mandible, anteriorly, hand to the left of midline, measuring 0.8 cm, that corresponds to the abnormality noted on CT. Electronically Signed   By: Jolaine Click M.D.   On: 02/10/2016 15:46    Micro Results    No results found for this or any previous visit (from the past 240 hour(s)).     Today   Subjective    Dominyck Reser is feeling much better, denies any nausea or vomiting. Patient left neck pain has improved, has no shortness of breath or chest pain Objective    Blood pressure 144/79, pulse 67, temperature 97.9 F (36.6 C), temperature source Oral, resp. rate 18, height  (1.727 m), weight 108.682 kg (239 lb 9.6 oz), SpO2 96 %.   Intake/Output Summary (Last 24 hours) at 02/11/16 0818 Last data filed at 02/11/16 0513  Gross per 24 hour  Intake    960 ml  Output    680 ml  Net    280 ml    Exam Gen. Awake and alert Neck. Left edema has improved as well as erythema. No lymphadenopathy palpable Chest. Lungs clear auscultation bilaterally no wheezing rales or rhonchi. Heart S1-S2 present and rhythmic, no gallops, rubs or murmurs. Abdomen soft nontender Extremities no edema Neurologically nonfocal    Data Review   CBC w Diff: Lab Results  Component Value Date   WBC 6.3 02/11/2016   HGB 13.0 02/11/2016   HCT 38.1* 02/11/2016   PLT 220 02/11/2016   LYMPHOPCT 35 02/11/2016   MONOPCT 15 02/11/2016   EOSPCT 5 02/11/2016   BASOPCT 0 02/11/2016    CMP: Lab Results  Component Value Date   NA 136 02/09/2016   K 3.5 02/09/2016   CL 101 02/09/2016   CO2 28 02/09/2016   BUN 10 02/09/2016   CREATININE 0.86 02/09/2016   PROT 6.3* 02/09/2016   ALBUMIN 3.0* 02/09/2016   BILITOT 0.7 02/09/2016   ALKPHOS 62 02/09/2016   AST 15 02/09/2016   ALT 13* 02/09/2016  .   Total Time in preparing paper work, data evaluation and todays exam - 45 minutes  Coralie Keens M.D on 02/11/2016 at 8:18 AM  [facility]   Office  [phone]

## 2016-05-07 ENCOUNTER — Encounter (HOSPITAL_BASED_OUTPATIENT_CLINIC_OR_DEPARTMENT_OTHER): Payer: Self-pay | Admitting: *Deleted

## 2016-05-07 ENCOUNTER — Emergency Department (HOSPITAL_BASED_OUTPATIENT_CLINIC_OR_DEPARTMENT_OTHER)
Admission: EM | Admit: 2016-05-07 | Discharge: 2016-05-07 | Disposition: A | Discharge status: Home / Self Care | Payer: Non-veteran care | Attending: Emergency Medicine | Admitting: Emergency Medicine

## 2016-05-07 DIAGNOSIS — Z7982 Long term (current) use of aspirin: Secondary | ICD-10-CM | POA: Diagnosis not present

## 2016-05-07 DIAGNOSIS — Z79899 Other long term (current) drug therapy: Secondary | ICD-10-CM | POA: Diagnosis not present

## 2016-05-07 DIAGNOSIS — I10 Essential (primary) hypertension: Secondary | ICD-10-CM | POA: Diagnosis not present

## 2016-05-07 DIAGNOSIS — M5440 Lumbago with sciatica, unspecified side: Secondary | ICD-10-CM | POA: Diagnosis not present

## 2016-05-07 DIAGNOSIS — G20 Parkinson's disease: Secondary | ICD-10-CM | POA: Insufficient documentation

## 2016-05-07 DIAGNOSIS — M545 Low back pain: Secondary | ICD-10-CM | POA: Diagnosis present

## 2016-05-07 MED ORDER — ONDANSETRON 4 MG PO TBDP
4.0000 mg | ORAL_TABLET | Freq: Once | ORAL | Status: AC
  Administered 2016-05-07: 4 mg via ORAL
  Filled 2016-05-07: qty 1

## 2016-05-07 MED ORDER — HYDROCODONE-ACETAMINOPHEN 5-325 MG PO TABS
1.0000 | ORAL_TABLET | Freq: Once | ORAL | Status: AC
  Administered 2016-05-07: 1 via ORAL
  Filled 2016-05-07: qty 1

## 2016-05-07 MED ORDER — HYDROCODONE-ACETAMINOPHEN 5-325 MG PO TABS: 1.0000 | ORAL_TABLET | ORAL | 0 refills | Status: DC | PRN

## 2016-05-07 MED ORDER — HYDROMORPHONE HCL 1 MG/ML IJ SOLN
0.5000 mg | Freq: Once | INTRAMUSCULAR | Status: AC
  Administered 2016-05-07: 0.5 mg via INTRAMUSCULAR
  Filled 2016-05-07: qty 1

## 2016-05-07 NOTE — ED Provider Notes (Signed)
[facility] Provider Note   CSN: [id number] Arrival date & time: 05/07/16  1932  By signing my name below, I, Doreatha MartinEva Mathews, attest that this documentation has been prepared under the direction and in the presence of Rolan BuccoMelanie Arthor Gorter, MD. Electronically Signed: Doreatha MartinEva Mathews, ED Scribe. 05/07/16. 10:11 PM.     History   Chief Complaint Chief Complaint  Patient presents with  . Back Pain    HPI Kenneth Brandt is a 81 y.o. male with h/o spinal stenosis who presents to the Emergency Department complaining of moderate, acute on chronic lower back pain with radiation to the right hip and across the top of the right thigh onset 3 days ago. Pt had XR at the [facility] last week showing a bone spur in his right hip. He states he was scheduled for MRI of his lumbar spine today for further evaluation that was rescheduled d/t his doctor not ordering anxiety medicine for MR. He was prescribed Lidoderm by the [facility], which has provided him no relief of pain and reports that he transiently experienced nausea and emesis after the first application. Pt states he has previously tried Lidoderm with no issue. [facility] was also [facility] and has not yet begun taking this medication. He has also tried Meloxicam with no relief of pain. He states his pain is constant in all positions and throughout all movements. Pt states his current pain is more severe than pain with prior spinal stenosis. He denies recent falls, injury or trauma. He denies numbness or weakness to BLE, additional complaints.    The history is provided by the patient. No language interpreter was used.    Past Medical History:  Diagnosis Date  . Arthritis   . Dementia   . Hypertension   . Parkinson disease [facility](HCC)     Patient Active Problem List   Diagnosis Date Noted  . Facial cellulitis 02/08/2016  . Dental abscess 02/08/2016  . Dementia with behavioral disturbance 02/08/2016  . Parkinson disease (HCC) 02/08/2016  . Essential  hypertension 02/08/2016    Past Surgical History:  Procedure Laterality Date  . BACK SURGERY         Home Medications    Prior to Admission medications   Medication Sig Start Date End Date Taking? Authorizing Provider  aspirin 81 MG tablet Take 81 mg by mouth daily.   Yes Historical Provider, MD  buPROPion (WELLBUTRIN XL) 150 MG 24 hr tablet Take 150 mg by mouth daily.   Yes Historical Provider, MD  carbidopa-levodopa (SINEMET IR) 10-100 MG tablet Take 1.5 tablets by mouth 3 (three) times daily.   Yes Historical Provider, MD  donepezil (ARICEPT) 10 MG tablet Take 10 mg by mouth at bedtime.   Yes Historical Provider, MD  DULoxetine (CYMBALTA) 60 MG capsule Take 60 mg by mouth 2 (two) times daily.   Yes Historical Provider, MD  gabapentin (NEURONTIN) 300 MG capsule Take 300 mg by mouth 3 (three) times daily.   Yes Historical Provider, MD  hydrochlorothiazide (HYDRODIURIL) 25 MG tablet Take 25 mg by mouth daily.   Yes Historical Provider, MD  hyoscyamine (LEVSIN, ANASPAZ) 0.125 MG tablet Take 0.125 mg by mouth every 6 (six) hours as needed for cramping.   Yes Historical Provider, MD  levothyroxine (SYNTHROID, LEVOTHROID) 75 MCG tablet Take 75 mcg by mouth daily before breakfast.   Yes Historical Provider, MD  loperamide (IMODIUM) 2 MG capsule Take 2 mg by mouth as needed for diarrhea or loose stools.  Yes Historical Provider, MD  acetaminophen (TYLENOL) 500 MG tablet Take 1 tablet (500 mg total) by mouth every 8 (eight) hours as needed (pain). 02/11/16   Mauricio Annett Gula, MD  cyclobenzaprine (FLEXERIL) 10 MG tablet Take 10 mg by mouth 3 (three) times daily as needed for muscle spasms.    Historical Provider, MD  HYDROcodone-acetaminophen (NORCO/VICODIN) 5-325 MG tablet Take 1-2 tablets by mouth every 4 (four) hours as needed. 05/07/16   Rolan Bucco, MD    Family History Family History  Problem Relation Age of Onset  . Cancer Mother   . CAD Father   . CAD Brother   . Cancer  Other   . Stroke Neg Hx     Social History Social History  Substance Use Topics  . Smoking status: Never Smoker  . Smokeless tobacco: Never Used  . Alcohol use No     Allergies   Review of patient's allergies indicates no known allergies.   Review of Systems Review of Systems  Constitutional: Negative for chills, diaphoresis, fatigue and fever.  HENT: Negative for congestion, rhinorrhea and sneezing.   Eyes: Negative.   Respiratory: Negative for cough, chest tightness and shortness of breath.   Cardiovascular: Negative for chest pain and leg swelling.  Gastrointestinal: Negative for abdominal pain, blood in stool, diarrhea, nausea and vomiting.  Genitourinary: Negative for difficulty urinating, flank pain, frequency and hematuria.  Musculoskeletal: Positive for back pain and myalgias. Negative for arthralgias.  Skin: Negative for rash.  Neurological: Negative for dizziness, speech difficulty, weakness, numbness and headaches.    Physical Exam Updated Vital Signs BP 138/80   Pulse 90   Temp 97.7 F (36.5 C) (Oral)   Resp 18   Ht 5\' 7"  (1.702 m)   Wt 235 lb (106.6 kg)   SpO2 97%   BMI 36.81 kg/m   Physical Exam  Constitutional: He is oriented to person, place, and time. He appears well-developed and well-nourished.  HENT:  Head: Normocephalic and atraumatic.  Eyes: Pupils are equal, round, and reactive to light.  Neck: Normal range of motion. Neck supple.  Cardiovascular: Normal rate, regular rhythm and normal heart sounds.   Pulmonary/Chest: Effort normal and breath sounds normal. No respiratory distress. He has no wheezes. He has no rales. He exhibits no tenderness.  Abdominal: Soft. Bowel sounds are normal. There is no tenderness. There is no rebound and no guarding.  Musculoskeletal: Normal range of motion. He exhibits tenderness. He exhibits no edema.  Tenderness to the lower lumbar spine and the right lumbar paraspinal area. Negative SLR. Motor function and  sensation intact. Pedal pulses intact. No swelling of the legs.   Lymphadenopathy:    He has no cervical adenopathy.  Neurological: He is alert and oriented to person, place, and time.  Skin: Skin is warm and dry. No rash noted.  Psychiatric: He has a normal mood and affect.  Nursing note and vitals reviewed.    ED Treatments / Results   DIAGNOSTIC STUDIES: Oxygen Saturation is 96% on RA, adequate by my interpretation.    COORDINATION OF CARE: 10:07 PM Discussed treatment plan with pt at bedside which includes pain management, f/u as scheduled for MRI and pt agreed to plan.    Labs (all labs ordered are listed, but only abnormal results are displayed) Labs Reviewed - No data to display  EKG  EKG Interpretation None       Radiology No results found.  Procedures Procedures (including critical care time)  Medications Ordered in  ED Medications  HYDROcodone-acetaminophen (NORCO/VICODIN) 5-325 MG per tablet 1 tablet (not administered)  HYDROmorphone (DILAUDID) injection 0.5 mg (0.5 mg Intramuscular Given 05/07/16 2215)  ondansetron (ZOFRAN-ODT) disintegrating tablet 4 mg (4 mg Oral Given 05/07/16 2217)     Initial Impression / Assessment and Plan / ED Course  I have reviewed the triage vital signs and the nursing notes.  Pertinent labs & imaging results that were available during my care of the patient were reviewed by me and considered in my medical decision making (see chart for details).  Clinical Course    Patient presents with an exacerbation of his back pain. He is currently scheduled for an MRI next week. He has no neurologic deficits or signs of cauda equina. His pain was controlled in the ED. He was given a short-term prescription for Vicodin to use at home. He will follow-up with his doctor in the  [place].  Final Clinical Impressions(s) / ED Diagnoses   Final diagnoses:  Midline low back pain with sciatica, sciatica laterality unspecified    New  Prescriptions New Prescriptions   HYDROCODONE-ACETAMINOPHEN (NORCO/VICODIN) 5-325 MG TABLET    Take 1-2 tablets by mouth every 4 (four) hours as needed.    I personally performed the services described in this documentation, which was scribed in my presence.  The recorded information has been reviewed and considered.    Rolan Bucco, MD 05/07/16 2308

## 2016-05-07 NOTE — ED Triage Notes (Signed)
Pt states lower right back pain/hip pain radiates to right leg for several weeks. Recently told he had stenosis/bone spur in the hip. Scheduled for MRI today (at the [facility]), but unable. Reports increasing pain and difficulty walking.

## 2016-06-25 ENCOUNTER — Encounter: Payer: Self-pay | Admitting: *Deleted

## 2016-06-28 ENCOUNTER — Encounter: Payer: Self-pay | Admitting: Diagnostic Neuroimaging

## 2016-06-28 ENCOUNTER — Ambulatory Visit (INDEPENDENT_AMBULATORY_CARE_PROVIDER_SITE_OTHER): Payer: Medicare (Managed Care) | Admitting: Diagnostic Neuroimaging

## 2016-06-28 VITALS — BP 150/82 | HR 80 | Ht 67.0 in | Wt 242.0 lb

## 2016-06-28 DIAGNOSIS — G20 Parkinson's disease: Secondary | ICD-10-CM

## 2016-06-28 DIAGNOSIS — M5416 Radiculopathy, lumbar region: Secondary | ICD-10-CM

## 2016-06-28 DIAGNOSIS — M48061 Spinal stenosis, lumbar region without neurogenic claudication: Secondary | ICD-10-CM

## 2016-06-28 DIAGNOSIS — R413 Other amnesia: Secondary | ICD-10-CM | POA: Diagnosis not present

## 2016-06-28 NOTE — Progress Notes (Signed)
[facility]  PATIENT: Kenneth Brandt DOB: Jan 19, 1935  REFERRING CLINICIAN: Williams HISTORY FROM: patient  REASON FOR VISIT: new consult    HISTORICAL  CHIEF COMPLAINT:  Chief Complaint  Patient presents with  . Spinal stenosis    rm 7, New Pt, "chronic bak, leg pain, herniated disk, bone spur from MRI in Sept"    HISTORY OF PRESENT ILLNESS:   81 year old right-handed male here for evaluation of lumbar spinal stenosis. Patient has history of hypertension, hyperkalemia, atrial fibrillation, Parkinson's disease. Patient participates in [facility] and PCP requested neurosurgery consultation. However this was set up with our office in neurology. We contacted the referring physician, who states that they had intended neurosurgery consultation for lumbar spinal stenosis but did request our input regarding Parkinson's disease.  Patient reports onset of left hand tremor around 2014. He was having falls, balance difficulty, acting out dreams. Patient was started on carbidopa/levodopa around that time and symptoms had improved. He denies any wearing off or peak dyskinesia symptoms.   Regarding low back pain he has had this problem for more than 20 years. In September 2017 he had significantly increased pain. His pain radiates into his right greater than left legs. Pain rating down to the right calf and right knee. MRI lumbar spine was obtained which showed L3-4 extra foraminal disc extrusion with moderate to severe right and mild left foraminal stenosis and mild spinal stenosis.   REVIEW OF SYSTEMS: Full 14 system review of systems performed and negative with exception ofWeight gain fatigue swelling in legs shortness of breath incontinence diarrhea rash moles joint pain cramps aching muscles allergies depression difficulty swallowing memory loss.  ALLERGIES: No Known Allergies  HOME MEDICATIONS: Outpatient Medications Prior to Visit  Medication Sig Dispense Refill    . acetaminophen (TYLENOL) 500 MG tablet Take 1 tablet (500 mg total) by mouth every 8 (eight) hours as needed (pain). 30 tablet 0  . aspirin 81 MG tablet Take 81 mg by mouth daily.    Marland Kitchen buPROPion (WELLBUTRIN XL) 150 MG 24 hr tablet Take 150 mg by mouth daily.    . carbidopa-levodopa (SINEMET IR) 10-100 MG tablet Take 1.5 tablets by mouth 3 (three) times daily.    . cyclobenzaprine (FLEXERIL) 10 MG tablet Take 10 mg by mouth 3 (three) times daily as needed for muscle spasms.    . DULoxetine (CYMBALTA) 60 MG capsule Take 60 mg by mouth 2 (two) times daily.    Marland Kitchen gabapentin (NEURONTIN) 300 MG capsule Take 300 mg by mouth 3 (three) times daily.    Marland Kitchen HYDROcodone-acetaminophen (NORCO/VICODIN) 5-325 MG tablet Take 1-2 tablets by mouth every 4 (four) hours as needed. 30 tablet 0  . levothyroxine (SYNTHROID, LEVOTHROID) 75 MCG tablet Take 75 mcg by mouth daily before breakfast.    . loperamide (IMODIUM) 2 MG capsule Take 2 mg by mouth as needed for diarrhea or loose stools.    . donepezil (ARICEPT) 10 MG tablet Take 10 mg by mouth at bedtime.    . hydrochlorothiazide (HYDRODIURIL) 25 MG tablet Take 25 mg by mouth daily.    . hyoscyamine (LEVSIN, ANASPAZ) 0.125 MG tablet Take 0.125 mg by mouth every 6 (six) hours as needed for cramping.     No facility-administered medications prior to visit.     PAST MEDICAL HISTORY: Past Medical History:  Diagnosis Date  . Arthritis   . Atrial fibrillation (HCC)   . Dementia   . Hypercholesteremia   . Hypertension   . Hypothyroid   .  OSA (obstructive sleep apnea)   . PAD (peripheral artery disease) (HCC)   . Parkinson disease (HCC)     PAST SURGICAL HISTORY: Past Surgical History:  Procedure Laterality Date  . ANKLE FRACTURE SURGERY Right   . APPENDECTOMY     remote  . BACK SURGERY  2009, 2011   laminectomies  . CATARACT EXTRACTION Bilateral   . REPLACEMENT TOTAL KNEE Right 2012  . RETINAL DETACHMENT SURGERY Right     FAMILY HISTORY: Family  History  Problem Relation Age of Onset  . Cancer Mother   . Pneumonia Mother   . CAD Father   . CAD Brother   . Cancer Other   . Stroke Neg Hx     SOCIAL HISTORY:  Social History   Social History  . Marital status: Married    Spouse name: Kenneth Brandt  . Number of children: 3  . Years of education: college   Occupational History  .      former Education officer, environmentalpastor, IT sales professionalmissionary   Social History Main Topics  . Smoking status: Never Smoker  . Smokeless tobacco: Never Used  . Alcohol use No  . Drug use: No  . Sexual activity: Not on file   Other Topics Concern  . Not on file   Social History Narrative   Lives at home with wife, caregiver   Caffeine- coffee, 1 cup daily, coke occasionally     PHYSICAL EXAM  GENERAL EXAM/CONSTITUTIONAL: Vitals:  Vitals:   06/28/16 1039  BP: (!) 150/82  Pulse: 80  Weight: 242 lb (109.8 kg)  Height: 5\' 7"  (1.702 m)     Body mass index is 37.9 kg/m.  No exam data present  Patient is in no distress; well developed, nourished and groomed; neck is supple  CARDIOVASCULAR:  Examination of carotid arteries is normal; no carotid bruits  Regular rate and rhythm, no murmurs  Examination of peripheral vascular system by observation and palpation is normal  EYES:  Ophthalmoscopic exam of optic discs and posterior segments is normal; no papilledema or hemorrhages  MUSCULOSKELETAL:  Gait, strength, tone, movements noted in Neurologic exam below  NEUROLOGIC: MENTAL STATUS:  No flowsheet data found.  awake, alert, oriented to person, place and time  recent and remote memory intact  normal attention and concentration  language fluent, comprehension intact, naming intact,   fund of knowledge appropriate  CRANIAL NERVE:   2nd - no papilledema on fundoscopic exam  2nd, 3rd, 4th, 6th - pupils equal and reactive to light, visual fields full to confrontation, extraocular muscles intact, no nystagmus  5th - facial sensation symmetric  7th  - facial strength symmetric  8th - hearing intact  9th - palate elevates symmetrically, uvula midline  11th - shoulder shrug symmetric  12th - tongue protrusion midline  MOTOR:   normal bulk and tone, full strength in the BUE, BLE  NO COGWHEEL RIGIDITY  MILD BRADYKINESIA IN LUE  MILD RIGHT CHIN TREMOR WITH FINGER TAPPING ON RIGHT OR LEFT SIDE  MILD POSTURAL AND ACTION TREMOR IN LUE > RUE   NO RESTING TREMOR  SENSORY:   normal and symmetric to light touch, temperature, vibration  COORDINATION:   finger-nose-finger --> MILD TREMOR WITH LUE > RUE  fine finger movements normal  REFLEXES:   deep tendon reflexes present and symmetric  ABSENT RIGHT PATELLAR REFLEX  GAIT/STATION:   narrow based gait; USES SINGLE POINT CANE; SLOW AND CAUTIOUS  GOOD ARM SWING; NO TREMOR WITH WALKING    DIAGNOSTIC DATA (LABS, IMAGING,  TESTING) - I reviewed patient records, labs, notes, testing and imaging myself where available.  Lab Results  Component Value Date   WBC 6.3 02/11/2016   HGB 13.0 02/11/2016   HCT 38.1 (L) 02/11/2016   MCV 93.4 02/11/2016   PLT 220 02/11/2016      Component Value Date/Time   NA 136 02/09/2016 0420   K 3.5 02/09/2016 0420   CL 101 02/09/2016 0420   CO2 28 02/09/2016 0420   GLUCOSE 96 02/09/2016 0420   BUN 10 02/09/2016 0420   CREATININE 0.86 02/09/2016 0420   CALCIUM 8.8 (L) 02/09/2016 0420   PROT 6.3 (L) 02/09/2016 0420   ALBUMIN 3.0 (L) 02/09/2016 0420   AST 15 02/09/2016 0420   ALT 13 (L) 02/09/2016 0420   ALKPHOS 62 02/09/2016 0420   BILITOT 0.7 02/09/2016 0420   GFRNONAA >60 02/09/2016 0420   GFRAA >60 02/09/2016 0420   No results found for: CHOL, HDL, LDLCALC, LDLDIRECT, TRIG, CHOLHDL Lab Results  Component Value Date   HGBA1C 5.9 (H) 02/09/2016   No results found for: VITAMINB12 Lab Results  Component Value Date   TSH 1.845 02/09/2016    05/12/16 MRI lumbar spine [I reviewed images myself and agree with interpretation.  -VRP]  - multi-level degnerative disc disease - At L3-4: right extra-foraminal disc extrusion (? Free fragment) with mod-severe right and mild left foraminal stenosis; mild central spinal stenosis  - At L4-5: 7mm antero-listhesis of L4 on L5; mod-severe biforaminal stenosis      ASSESSMENT AND PLAN  81 y.o. year old male here with Parkinson's disease since 2014, stable on carbidopa/levodopa. Also with right lumbar radiculopathy since September 2017.    Dx:  1. Spinal stenosis of lumbar region, unspecified whether neurogenic claudication present   2. Right lumbar radiculopathy   3. Parkinsonism, unspecified Parkinsonism type (HCC)   4. Memory loss      PLAN: I spent 45 minutes of face to face time with patient. Greater than 50% of time was spent in counseling and coordination of care with patient. In summary we discussed: - continue carbidopa/levodopa for parkinsonism (? Parkinson's disease vs dementia with lewy bodies) - agree with pain management / neurosurgery referral for right lumbar radiculopathy - continue pain medications - memory loss could be related to pain, pain meds, depression, or underlying neurodegenerative disorder (such as dementia with lewy bodies)  Return if symptoms worsen or fail to improve, for return to PCP.    Suanne MarkerVIKRAM R. PENUMALLI, MD 06/28/2016, 11:23 AM Certified in Neurology, Neurophysiology and Neuroimaging  [facility] [address], [address] [place], [place] [address] [phone]

## 2016-06-28 NOTE — Patient Instructions (Signed)
Thank you for coming to see [place] at [facility]. I hope we have been able to provide you high quality care today.  You may receive a patient satisfaction survey over the next few weeks. We would appreciate your feedback and comments so that we may continue to improve ourselves and the health of our patients.  - continue carbidopa/levodopa  - agree with spine surgery evaluation and pain mgmt referral    ~~~~~~~~~~~~~~~~~~~~~~~~~~~~~~~~~~~~~~~~~~~~~~~~~~~~~~~~~~~~~~~~~  DR. PENUMALLI'S GUIDE TO HAPPY AND HEALTHY LIVING These are some of my general health and wellness recommendations. Some of them may apply to you better than others. Please use common sense as you try these suggestions and feel free to ask me any questions.   ACTIVITY/FITNESS Mental, social, emotional and physical stimulation are very important for brain and body health. Try learning a new activity (arts, music, language, sports, games).  Keep moving your body to the best of your abilities. You can do this at home, inside or outside, the park, community center, gym or anywhere you like. Consider a physical therapist or personal trainer to get started.    NUTRITION Eat more plants: colorful vegetables, nuts, seeds and berries.  Eat less sugar, salt, preservatives and processed foods.  Avoid toxins such as cigarettes and alcohol.  Drink water when you are thirsty. Warm water with a slice of lemon is an excellent morning drink to start the day.  Consider these websites for more information The Nutrition Source (https://www.henry-hernandez.biz/) Precision Nutrition (WindowBlog.ch)   RELAXATION Consider practicing mindfulness meditation or other relaxation techniques such as deep breathing, prayer, yoga, tai chi, massage. See website mindful.org or the apps Headspace or Calm to help get started.   SLEEP Try to get at least 7-8+ hours sleep per day. Regular  exercise and reduced caffeine will help you sleep better. Practice good sleep hygeine techniques. See website sleep.org for more information.   PLANNING Prepare estate planning, living will, healthcare POA documents. Sometimes this is best planned with the help of an attorney. Theconversationproject.org and agingwithdignity.org are excellent resources.

## 2016-07-02 ENCOUNTER — Other Ambulatory Visit (HOSPITAL_BASED_OUTPATIENT_CLINIC_OR_DEPARTMENT_OTHER): Payer: Self-pay

## 2016-07-02 DIAGNOSIS — G4733 Obstructive sleep apnea (adult) (pediatric): Secondary | ICD-10-CM

## 2016-08-03 ENCOUNTER — Ambulatory Visit (HOSPITAL_BASED_OUTPATIENT_CLINIC_OR_DEPARTMENT_OTHER): Payer: Medicare (Managed Care) | Attending: Internal Medicine | Admitting: Internal Medicine

## 2016-08-03 VITALS — Ht 66.0 in | Wt 245.0 lb

## 2016-08-03 DIAGNOSIS — G4733 Obstructive sleep apnea (adult) (pediatric): Secondary | ICD-10-CM | POA: Diagnosis not present

## 2016-08-08 DIAGNOSIS — G4733 Obstructive sleep apnea (adult) (pediatric): Secondary | ICD-10-CM | POA: Diagnosis not present

## 2016-08-08 NOTE — Procedures (Signed)
   Patient Name: Kenneth Brandt, [name] Study Date: 08/03/2016 Gender: Male D.O.B: [birth year] Age (years): 81 Referring Provider: Charissa BashJulie Williams Height (inches): 66 Interpreting Physician: Jetty Duhamellinton Young MD, ABSM Weight (lbs): 245 RPSGT: Wylie HailDavis, Rico BMI: 40 MRN: [id number] Neck Size: 17.50 CLINICAL INFORMATION Sleep Study Type: NPSG Indication for sleep study: OSA Epworth Sleepiness Score: 13  SLEEP STUDY TECHNIQUE As per the AASM Manual for the Scoring of Sleep and Associated Events v2.3 (April 2016) with a hypopnea requiring 4% desaturations. The channels recorded and monitored were frontal, central and occipital EEG, electrooculogram (EOG), submentalis EMG (chin), nasal and oral airflow, thoracic and abdominal wall motion, anterior tibialis EMG, snore microphone, electrocardiogram, and pulse oximetry.  MEDICATIONS Medications self-administered by patient taken the night of the study : none reported  SLEEP ARCHITECTURE The study was initiated at 10:57:24 PM and ended at 5:12:22 AM. Sleep onset time was 3.9 minutes and the sleep efficiency was 91.5%. The total sleep time was 343.1 minutes. Stage REM latency was 174.0 minutes. The patient spent 13.55% of the night in stage N1 sleep, 70.42% in stage N2 sleep, 0.00% in stage N3 and 16.03% in REM. Alpha intrusion was absent. Supine sleep was 79.22%.  RESPIRATORY PARAMETERS The overall apnea/hypopnea index (AHI) was 4.7 per hour. There were 13 total apneas, including 13 obstructive, 0 central and 0 mixed apneas. There were 14 hypopneas and 20 RERAs. The AHI during Stage REM sleep was 3.3 per hour. AHI while supine was 6.0 per hour. The mean oxygen saturation was 91.95%. The minimum SpO2 during sleep was 85.00%. Loud snoring was noted during this study.  CARDIAC DATA The 2 lead EKG demonstrated sinus rhythm. The mean heart rate was 68.07 beats per minute. Other EKG findings include: None.  LEG MOVEMENT DATA The total PLMS were 549  with a resulting PLMS index of 96.01. Associated arousal with leg movement index was 5.1  . IMPRESSIONS - No significant obstructive sleep apnea occurred during this study (AHI = 4.7/h). Upper limit of normal AHI 5.0/ hr. - No significant central sleep apnea occurred during this study (CAI = 0.0/h). - Mild oxygen desaturation was noted during this study (Min O2 = 85.00%). - The patient snored with Loud snoring volume. - No cardiac abnormalities were noted during this study. - Severe periodic limb movements of sleep occurred during the study. Associated arousals were significant.  DIAGNOSIS - Periodic Limb Movement Syndrome (327.51 [G47.61 ICD-10]) - Primary Snoring (786.09 [R06.83 ICD-10])  RECOMMENDATIONS - Consider options for management of sleep disturbance due to limb movement. - Avoid alcohol, sedatives and other CNS depressants that may worsen sleep apnea and disrupt normal sleep architecture. - Sleep hygiene should be reviewed to assess factors that may improve sleep quality. - Weight management and regular exercise should be initiated or continued if appropriate.  [Electronically signed] 08/08/2016 10:54 AM  Jetty Duhamellinton Young MD, ABSM Diplomate, American Board of Sleep Medicine   NPI: [id number]  Waymon BudgeYOUNG,CLINTON D Diplomate, American Board of Sleep Medicine  ELECTRONICALLY SIGNED ON:  08/08/2016, 10:54 AM [facility] PH: [phone]   FX: [phone] ACCREDITED BY THE AMERICAN ACADEMY OF SLEEP MEDICINE

## 2017-01-15 ENCOUNTER — Emergency Department (HOSPITAL_BASED_OUTPATIENT_CLINIC_OR_DEPARTMENT_OTHER): Payer: Medicare (Managed Care)

## 2017-01-15 ENCOUNTER — Emergency Department (HOSPITAL_BASED_OUTPATIENT_CLINIC_OR_DEPARTMENT_OTHER)
Admission: EM | Admit: 2017-01-15 | Discharge: 2017-01-15 | Disposition: A | Discharge status: Home / Self Care | Payer: Medicare (Managed Care) | Attending: Emergency Medicine | Admitting: Emergency Medicine

## 2017-01-15 ENCOUNTER — Encounter (HOSPITAL_BASED_OUTPATIENT_CLINIC_OR_DEPARTMENT_OTHER): Payer: Self-pay | Admitting: Emergency Medicine

## 2017-01-15 DIAGNOSIS — R410 Disorientation, unspecified: Secondary | ICD-10-CM | POA: Diagnosis not present

## 2017-01-15 DIAGNOSIS — R1084 Generalized abdominal pain: Secondary | ICD-10-CM | POA: Diagnosis not present

## 2017-01-15 DIAGNOSIS — Z79899 Other long term (current) drug therapy: Secondary | ICD-10-CM | POA: Insufficient documentation

## 2017-01-15 DIAGNOSIS — I10 Essential (primary) hypertension: Secondary | ICD-10-CM | POA: Diagnosis not present

## 2017-01-15 DIAGNOSIS — M25462 Effusion, left knee: Secondary | ICD-10-CM | POA: Insufficient documentation

## 2017-01-15 DIAGNOSIS — R112 Nausea with vomiting, unspecified: Secondary | ICD-10-CM | POA: Diagnosis not present

## 2017-01-15 DIAGNOSIS — R1033 Periumbilical pain: Secondary | ICD-10-CM | POA: Insufficient documentation

## 2017-01-15 DIAGNOSIS — G20 Parkinson's disease: Secondary | ICD-10-CM | POA: Insufficient documentation

## 2017-01-15 DIAGNOSIS — Z7982 Long term (current) use of aspirin: Secondary | ICD-10-CM | POA: Insufficient documentation

## 2017-01-15 DIAGNOSIS — R197 Diarrhea, unspecified: Secondary | ICD-10-CM | POA: Diagnosis not present

## 2017-01-15 DIAGNOSIS — E039 Hypothyroidism, unspecified: Secondary | ICD-10-CM | POA: Insufficient documentation

## 2017-01-15 LAB — URINALYSIS, ROUTINE W REFLEX MICROSCOPIC
Bilirubin Urine: NEGATIVE
Glucose, UA: NEGATIVE mg/dL
Hgb urine dipstick: NEGATIVE
Ketones, ur: NEGATIVE mg/dL
Leukocytes, UA: NEGATIVE
Nitrite: NEGATIVE
Protein, ur: NEGATIVE mg/dL
Specific Gravity, Urine: 1.014 (ref 1.005–1.030)
pH: 7.5 (ref 5.0–8.0)

## 2017-01-15 LAB — COMPREHENSIVE METABOLIC PANEL
ALT: 15 U/L — ABNORMAL LOW (ref 17–63)
AST: 19 U/L (ref 15–41)
Albumin: 3.5 g/dL (ref 3.5–5.0)
Alkaline Phosphatase: 60 U/L (ref 38–126)
Anion gap: 9 (ref 5–15)
BUN: 13 mg/dL (ref 6–20)
CO2: 23 mmol/L (ref 22–32)
Calcium: 9 mg/dL (ref 8.9–10.3)
Chloride: 104 mmol/L (ref 101–111)
Creatinine, Ser: 0.81 mg/dL (ref 0.61–1.24)
GFR calc Af Amer: >60 mL/min (ref >60)
GFR calc non Af Amer: >60 mL/min (ref >60)
Glucose, Bld: 124 mg/dL — ABNORMAL HIGH (ref 65–99)
Potassium: 3.5 mmol/L (ref 3.5–5.1)
Sodium: 136 mmol/L (ref 135–145)
Total Bilirubin: 0.5 mg/dL (ref 0.3–1.2)
Total Protein: 6.5 g/dL (ref 6.5–8.1)

## 2017-01-15 LAB — CBC WITH DIFFERENTIAL/PLATELET
Basophils Absolute: 0 10*3/uL (ref 0.0–0.1)
Basophils Relative: 0 %
Eosinophils Absolute: 0.1 10*3/uL (ref 0.0–0.7)
Eosinophils Relative: 1 %
HCT: 38.2 % — ABNORMAL LOW (ref 39.0–52.0)
Hemoglobin: 13.3 g/dL (ref 13.0–17.0)
Lymphocytes Relative: 18 %
Lymphs Abs: 1.6 10*3/uL (ref 0.7–4.0)
MCH: 33.7 pg (ref 26.0–34.0)
MCHC: 34.8 g/dL (ref 30.0–36.0)
MCV: 96.7 fL (ref 78.0–100.0)
Monocytes Absolute: 0.7 10*3/uL (ref 0.1–1.0)
Monocytes Relative: 7 %
Neutro Abs: 6.6 10*3/uL (ref 1.7–7.7)
Neutrophils Relative %: 74 %
Platelets: 216 10*3/uL (ref 150–400)
RBC: 3.95 MIL/uL — ABNORMAL LOW (ref 4.22–5.81)
RDW: 12.8 % (ref 11.5–15.5)
WBC: 8.9 10*3/uL (ref 4.0–10.5)

## 2017-01-15 LAB — LIPASE, BLOOD: Lipase: 14 U/L (ref 11–51)

## 2017-01-15 MED ORDER — MORPHINE SULFATE (PF) 4 MG/ML IV SOLN
4.0000 mg | Freq: Once | INTRAVENOUS | Status: AC
  Administered 2017-01-15: 4 mg via INTRAVENOUS
  Filled 2017-01-15: qty 1

## 2017-01-15 MED ORDER — SODIUM CHLORIDE 0.9 % IV BOLUS (SEPSIS)
500.0000 mL | Freq: Once | INTRAVENOUS | Status: AC
  Administered 2017-01-15: 500 mL via INTRAVENOUS

## 2017-01-15 MED ORDER — ONDANSETRON 4 MG PO TBDP: 4.0000 mg | ORAL_TABLET | Freq: Three times a day (TID) | ORAL | 0 refills | Status: DC | PRN

## 2017-01-15 MED ORDER — ONDANSETRON HCL 4 MG/2ML IJ SOLN
INTRAMUSCULAR | Status: AC
  Filled 2017-01-15: qty 2

## 2017-01-15 MED ORDER — ONDANSETRON HCL 4 MG/2ML IJ SOLN
4.0000 mg | Freq: Once | INTRAMUSCULAR | Status: AC
  Administered 2017-01-15: 4 mg via INTRAVENOUS
  Filled 2017-01-15: qty 2

## 2017-01-15 MED ORDER — IOPAMIDOL (ISOVUE-300) INJECTION 61%
100.0000 mL | Freq: Once | INTRAVENOUS | Status: AC | PRN
  Administered 2017-01-15: 100 mL via INTRAVENOUS

## 2017-01-15 MED ORDER — ONDANSETRON HCL 4 MG/2ML IJ SOLN
4.0000 mg | Freq: Once | INTRAMUSCULAR | Status: AC
  Administered 2017-01-15: 4 mg via INTRAVENOUS

## 2017-01-15 NOTE — ED Triage Notes (Signed)
Pt awoke with vomiting and intermittent confusion. Pt had left knee drained at doctor's office yesterday due to swelling in left leg.

## 2017-01-15 NOTE — ED Provider Notes (Signed)
[facility] Provider Note   CSN: [id number] Arrival date & time: 01/15/17  0217     History   Chief Complaint Chief Complaint  Patient presents with  . Emesis    HPI Kenneth Brandt is a 82 y.o. male.  HPI  This is a 82 year old male with a history of atrial fibrillation, hypertension, hypercholesterolemia, Parkinson's disease who presents with vomiting. Patient's family provides most of the history. Over last week he's been more intermittently confused. Yesterday he had a left knee effusion drained by his primary physician. He went home and slept. Upon wakening and after eating dinner, he had onset of vomiting. They state he has been unable to keep anything down. Vomiting described as nonbilious, nonbloody. At baseline he has chronic diarrhea. Last bowel movement 1 hour prior to arrival. Upon questioning, patient is awake, alert, and oriented 3. He reports mid abdominal pain that is crampy and nonradiating. He is unable to quantify the pain. This is his only complaint. Denies chest pain. Does report intermittent shortness of breath but no current shortness of breath. No fevers.  Past Medical History:  Diagnosis Date  . Arthritis   . Atrial fibrillation (HCC)   . Dementia   . Hypercholesteremia   . Hypertension   . Hypothyroid   . OSA (obstructive sleep apnea)   . PAD (peripheral artery disease) (HCC)   . Parkinson disease [facility])     Patient Active Problem List   Diagnosis Date Noted  . Facial cellulitis 02/08/2016  . Dental abscess 02/08/2016  . Dementia with behavioral disturbance 02/08/2016  . Parkinson disease (HCC) 02/08/2016  . Essential hypertension 02/08/2016    Past Surgical History:  Procedure Laterality Date  . ANKLE FRACTURE SURGERY Right   . APPENDECTOMY     remote  . BACK SURGERY  2009, 2011   laminectomies  . CATARACT EXTRACTION Bilateral   . REPLACEMENT TOTAL KNEE Right 2012  . RETINAL DETACHMENT SURGERY Right        Home  Medications    Prior to Admission medications   Medication Sig Start Date End Date Taking? Authorizing Provider  acetaminophen (TYLENOL) 500 MG tablet Take 1 tablet (500 mg total) by mouth every 8 (eight) hours as needed (pain). 02/11/16   Arrien, York Ram, MD  aspirin 81 MG tablet Take 81 mg by mouth daily.    [provider]  buPROPion (WELLBUTRIN XL) 150 MG 24 hr tablet Take 150 mg by mouth daily.    [provider]  carbidopa-levodopa (SINEMET IR) 10-100 MG tablet Take 1.5 tablets by mouth 3 (three) times daily.    [provider]  cyclobenzaprine (FLEXERIL) 10 MG tablet Take 10 mg by mouth 3 (three) times daily as needed for muscle spasms.    [provider]  DULoxetine (CYMBALTA) 60 MG capsule Take 60 mg by mouth 2 (two) times daily.    [provider]  gabapentin (NEURONTIN) 300 MG capsule Take 300 mg by mouth 3 (three) times daily.    [provider]  HYDROcodone-acetaminophen (NORCO/VICODIN) 5-325 MG tablet Take 1-2 tablets by mouth every 4 (four) hours as needed. 05/07/16   Rolan Bucco, MD  levothyroxine (SYNTHROID, LEVOTHROID) 75 MCG tablet Take 75 mcg by mouth daily before breakfast.    [provider]  loperamide (IMODIUM) 2 MG capsule Take 2 mg by mouth as needed for diarrhea or loose stools.    [provider]  losartan (COZAAR) 50 MG tablet Take 50 mg by mouth daily.  [provider]  ondansetron (ZOFRAN ODT) 4 MG disintegrating tablet Take 1 tablet (4 mg total) by mouth every 8 (eight) hours as needed for nausea or vomiting. 01/15/17   Tandi Hanko, Mayer Masker, MD    Family History Family History  Problem Relation Age of Onset  . Cancer Mother   . Pneumonia Mother   . CAD Father   . CAD Brother   . Cancer Other   . Stroke Neg Hx     Social History Social History  Substance Use Topics  . Smoking status: Never Smoker  . Smokeless tobacco: Never Used  . Alcohol use No     Allergies     Patient has no known allergies.   Review of Systems Review of Systems  Constitutional: Negative for fever.  Respiratory: Negative for shortness of breath.   Cardiovascular: Negative for chest pain.  Gastrointestinal: Positive for abdominal pain, diarrhea, nausea and vomiting. Negative for blood in stool.  Genitourinary: Negative for dysuria.  Skin: Negative for rash.  Psychiatric/Behavioral: Positive for confusion.  All other systems reviewed and are negative.    Physical Exam Updated Vital Signs BP 136/63   Pulse 86   Temp 98.4 F (36.9 C) (Oral)   Resp 20   Ht 5\' 8"  (1.727 m)   Wt 250 lb (113.4 kg)   SpO2 92%   BMI 38.01 kg/m   Physical Exam  Constitutional: He is oriented to person, place, and time. He appears well-developed and well-nourished.  Overweight  HENT:  Head: Normocephalic and atraumatic.  Cardiovascular: Normal rate, regular rhythm and normal heart sounds.   No murmur heard. Pulmonary/Chest: Effort normal and breath sounds normal. No respiratory distress. He has no wheezes.  Abdominal: Soft. Bowel sounds are normal. There is tenderness. There is no rebound and no guarding.  Mild tenderness to palpation over the periumbilical region  Musculoskeletal: He exhibits no edema.  Left knee with mild effusion, 1+ left lower extremity edema, normal range of motion of the knee, no warmth or erythema, no overlying skin changes  Neurological: He is alert and oriented to person, place, and time.  Skin: Skin is warm and dry.  Psychiatric: He has a normal mood and affect.  Nursing note and vitals reviewed.    ED Treatments / Results  Labs (all labs ordered are listed, but only abnormal results are displayed) Labs Reviewed  CBC WITH DIFFERENTIAL/PLATELET - Abnormal; Notable for the following:       Result Value   RBC 3.95 (*)    HCT 38.2 (*)    All other components within normal limits  COMPREHENSIVE METABOLIC PANEL - Abnormal; Notable for the following:     Glucose, Bld 124 (*)    ALT 15 (*)    All other components within normal limits  LIPASE, BLOOD  URINALYSIS, ROUTINE W REFLEX MICROSCOPIC    EKG  EKG Interpretation  Date/Time:  Saturday Jan 15 2017 02:50:53 EDT Ventricular Rate:  66 PR Interval:    QRS Duration: 82 QT Interval:  390 QTC Calculation: 409 R Axis:   17 Text Interpretation:  Sinus rhythm Low voltage, precordial leads Confirmed by Ross Marcus ([id number]) on 01/15/2017 3:26:47 AM       Radiology Dg Abdomen 1 View  Result Date: 01/15/2017 CLINICAL DATA:  Vomiting.  Abdominal discomfort. EXAM: ABDOMEN - 1 VIEW COMPARISON:  None. FINDINGS: Divided supine views of the abdomen. No bowel dilatation to suggest obstruction. Small stool in the ascending colon, small volume of stool in the  rectosigmoid colon. No evidence of free air. No evident radiopaque calculi. Degenerative changes in both hips. IMPRESSION: Normal bowel gas pattern. No evidence of free air. Small stool burden. Electronically Signed   By: Rubye Oaks M.D.   On: 01/15/2017 03:33   Ct Abdomen Pelvis W Contrast  Result Date: 01/15/2017 CLINICAL DATA:  Awoke with vomiting.  Abdominal pain. EXAM: CT ABDOMEN AND PELVIS WITH CONTRAST TECHNIQUE: Multidetector CT imaging of the abdomen and pelvis was performed using the standard protocol following bolus administration of intravenous contrast. CONTRAST:  ISOVUE-300 IOPAMIDOL (ISOVUE-300) INJECTION 61% COMPARISON:  Radiograph earlier today. FINDINGS: Lower chest: Nonspecific subpleural ground-glass opacities at the lung bases, may be atelectasis or interstitial lung disease. Minimal right lower lobe bronchiectasis. No consolidation or pleural fluid. Hepatobiliary: No focal liver abnormality is seen. No gallstones, gallbladder wall thickening, or biliary dilatation. Pancreas: Parenchymal atrophy and fatty infiltration. No ductal dilatation or inflammation. Spleen: Normal in size without focal abnormality.  Adrenals/Urinary Tract: Normal adrenal glands. No hydronephrosis. Homogeneous enhancement with symmetric renal excretion on delayed phase imaging. Mild nonspecific perinephric edema. Ureters are decompressed. A well-defined 4.8 x 2.9 cm simple fluid density lesion abutting the posterior right kidney is likely exophytic cyst. Urinary bladder is physiologically distended without wall thickening. Stomach/Bowel: The stomach is decompressed. No gastric wall thickening. No small bowel inflammation or obstruction. The appendix is not confidently identified, no pericecal inflammation. Small to moderate stool burden throughout the colon without colonic wall thickening. Vascular/Lymphatic: Aortic atherosclerosis and tortuosity without aneurysm. No adenopathy. Reproductive: Prostate is unremarkable. Other: No free air, free fluid, or intra-abdominal fluid collection. Fat containing right inguinal hernia. Tiny fat containing umbilical hernia. Musculoskeletal: There are no acute or suspicious osseous abnormalities. Mild loss of height of T12 vertebral body appears chronic. Degenerative change in the lumbar spine with vacuum phenomenon and minimal anterolisthesis of L4 on L5. Degenerative change of both hips. IMPRESSION: 1. No acute abnormality or explanation for abdominal pain and vomiting. 2. Aortic atherosclerosis without aneurysm. 3. Fat containing right inguinal hernia. Tiny fat containing umbilical hernia. Electronically Signed   By: Rubye Oaks M.D.   On: 01/15/2017 04:47    Procedures Procedures (including critical care time)  Medications Ordered in ED Medications  sodium chloride 0.9 % bolus 500 mL (0 mLs Intravenous Stopped 01/15/17 0320)  ondansetron (ZOFRAN) injection 4 mg (4 mg Intravenous Given 01/15/17 0250)  ondansetron (ZOFRAN) injection 4 mg (4 mg Intravenous Given 01/15/17 0335)  morphine 4 MG/ML injection 4 mg (4 mg Intravenous Given 01/15/17 0408)  iopamidol (ISOVUE-300) 61 % injection 100 mL  (100 mLs Intravenous Contrast Given 01/15/17 0419)     Initial Impression / Assessment and Plan / ED Course  I have reviewed the triage vital signs and the nursing notes.  Pertinent labs & imaging results that were available during my care of the patient were reviewed by me and considered in my medical decision making (see chart for details).  Clinical Course as of Jan 15 537  Sat Jan 15, 2017  0403 Workup reassuring; however, patient still uncomfortable. Given age and continued discomfort, will obtain CT scan.  [CH]  0535 CT neg.  Tolerating PO.  Feels better.  [CH]    Clinical Course User Index [CH] Samentha Perham, Mayer Masker, MD    Patient presents with abdominal pain and vomiting. Nontoxic on exam. Mild tenderness to palpation without signs of peritonitis. Vital signs reassuring. Initial lab work and urinalysis are reassuring. Abdominal x-ray without obstructive signs. However, patient continues  to have symptoms. CT scan was ordered. See clinical course above. CT scan is negative. On recheck, patient feels better after morphine and Zofran. He is able to tolerate fluids. Will discharge home with Zofran. Strict inclusion return precautions given to the patient and his family. They were updated and stated understanding.  After history, exam, and medical workup I feel the patient has been appropriately medically screened and is safe for discharge home. Pertinent diagnoses were discussed with the patient. Patient was given return precautions.   Final Clinical Impressions(s) / ED Diagnoses   Final diagnoses:  Non-intractable vomiting with nausea, unspecified vomiting type  Generalized abdominal pain    New Prescriptions New Prescriptions   ONDANSETRON (ZOFRAN ODT) 4 MG DISINTEGRATING TABLET    Take 1 tablet (4 mg total) by mouth every 8 (eight) hours as needed for nausea or vomiting.     Shon BatonHorton, Luigi Stuckey F, MD 01/15/17 [phone]

## 2017-01-15 NOTE — Discharge Instructions (Signed)
You were seen today for abdominal pain and vomiting. Your workup is reassuring. Make sure that you are staying well-hydrated. He will be given Zofran for nausea and vomiting at home. If you develop any new or worsening symptoms you need to be reevaluated immediately.

## 2017-01-15 NOTE — ED Notes (Signed)
Patient transported to X-ray 

## 2017-01-15 NOTE — ED Notes (Signed)
Pt given water for PO challenge 

## 2017-01-31 NOTE — H&P (Signed)
PREOPERATIVE H&P Patient ID: Kenneth Brandt MRN: [id number] DOB/AGE: 09-05-1934 82 y.o.  Chief Complaint: OA LEFT KNEE  Planned Procedure Date: 02/22/17 Medical and Cardiac Clearance by Dr. Charissa Bash    HPI: Kenneth Brandt is a 82 y.o. male with a hx of HTN, HLD, Vit D Deficiency, Exersional dyspnea - multifactoral (hx of brochiectasis, restrictive lung disease from obesity, deconditioning), Parkinson's Disease (w/ associated gait impairment, falls, mild cognitive impairment, MDD, dysphagia), PAD, vision impairment, chronic diarrhea w/ fecal incontinence, hypothyroidism, chronic LBD / sciatica dt spinal stenosis, and s/p R TKA 2010 at the [facility]. He presents today for continued evaluation of OA LEFT KNEE. The patient has a history of pain and functional disability in the left knee due to arthritis and has failed non-surgical conservative treatments for greater than 12 weeks to include NSAID's and/or analgesics, corticosteriod injections, use of assistive devices and activity modification.  Onset of symptoms was gradual, starting >10 years ago with gradually worsening course since that time.  Patient currently rates pain at 10 out of 10 with activity. Patient has night pain, worsening of pain with activity and weight bearing, pain that interferes with activities of daily living and pain with passive range of motion.  Patient has evidence of subchondral cysts, subchondral sclerosis, periarticular osteophytes and joint space narrowing by imaging studies.  There is no active infection.  Past Medical History: Hypertension Hyperlipidemia Vit D Deficiency Exersional dyspnea - multifactoral (hx of brochiectasis, restrictive lung disease from obesity, deconditioning) Parkinson's Disease (w/ associated gait impairment, falls, mild cognitive impairment, MDD, dysphagia) Peripheral artery disease Vision impairment Chronic diarrhea w/ fecal incontinence Hypothyroidism Chronic LBD / sciatica dt  spinal stenosis  Past Surgical History:  Procedure Laterality Date  . ANKLE FRACTURE SURGERY Right   . APPENDECTOMY     remote  . BACK SURGERY  2009, 2011   laminectomies  . CATARACT EXTRACTION Bilateral   . REPLACEMENT TOTAL KNEE Right 2012  . RETINAL DETACHMENT SURGERY Right   Right Total Knee Arthroplasty 2010 at the [place] in [place], [place]  No Known Allergies   Medications:   Social History   Social History  . Marital status: Married    Spouse name: Beecher Mcardle  . Number of children: 3  . Years of education: college   Occupational History  .      former Education officer, environmental, IT sales professional   Social History Main Topics  . Smoking status: Never Smoker  . Smokeless tobacco: Never Used  . Alcohol use No  . Drug use: No  . Sexual activity: Not on file   Other Topics Concern  . Not on file   Social History Narrative   Lives at home with wife, caregiver   Caffeine- coffee, 1 cup daily, coke occasionally   Family History  Problem Relation Age of Onset  . Cancer Mother   . Pneumonia Mother   . CAD Father   . CAD Brother   . Cancer Other   . Stroke Neg Hx     ROS: Currently denies lightheadedness, dizziness, Fever, chills, CP.  Some exertional SOB.  Vision changes dt cataracts.  He has diarrhea and occasional constipation w/ hemorrhoids.  H/O ulcer in the 60's w/o history of endoscopy eval.  Hepatitis A in 1966.  He has ankle swelling.  Frequent urination w/ pain w/ report of negative UA.   No personal history of DVT, PE, MI, or CVA. No loose teeth (he has dentures and 3 native teeth) All other systems have been reviewed and were  otherwise currently negative with the exception of those mentioned in the HPI and as above.  Objective: Vitals: Ht: 5'8" Wt: 250 Temp: 97.9 BP: 135/76 Pulse: 82 O2 96% on room air. Physical Exam: General: Alert, NAD.  Antalgic Gait.  Utilizes a rolling walker. HEENT: EOMI, Good Neck Extension  Pulm: Clear B/L A/P w/o crackle or wheeze.  CV: RRR, No m/g/r  appreciated  GI: soft, NT, ND Neuro: Flat affect.  No gross focal deficit.  Skin: No lesions in the area of chief complaint.   MSK/Surgical Site: left knee w/o redness.  Moderate effusion.  + JLT. ROM 2-110 vs 0-120 on left.  4/5 strength in extension and flexion.  +EHL/FHL.  Stable to varus and valgus stress.    Imaging Review Plain radiographs demonstrate severe degenerative joint disease of the left knee.   Assessment: OA LEFT KNEE Active Problems:   Parkinson disease (HCC)   Essential hypertension   Hyperlipidemia   Vitamin D deficiency   Peripheral artery disease (HCC)   Chronic diarrhea   Hypothyroidism   Chronic low back pain   Chronic pain   Plan: Plan for Procedure(s): TOTAL KNEE ARTHROPLASTY  The patient history, physical exam, clinical judgement of the provider and imaging are consistent with end stage degenerative joint disease and total joint arthroplasty is deemed medically necessary. The treatment options including medical management, injection therapy, and arthroplasty were discussed at length. The risks and benefits of Procedure(s): TOTAL KNEE ARTHROPLASTY were presented and reviewed.  The risks of nonoperative treatment, versus surgical intervention including but not limited to continued pain, aseptic loosening, stiffness, dislocation/subluxation, infection, bleeding, nerve injury, blood clots, cardiopulmonary complications, morbidity, mortality, among others were discussed. The patient verbalizes understanding and wishes to proceed with the plan.  Patient is being admitted for inpatient treatment for surgery, pain control, PT, OT, prophylactic antibiotics, VTE prophylaxis, progressive ambulation, ADL's and discharge planning.   Dental prophylaxis discussed and recommended for 2 years postoperatively.   The patient does meet the criteria for TXA which will be used perioperatively via IV.    ASA 325 mg will be used postoperatively for DVT prophylaxis in addition  to SCDs, and early ambulation.  He takes 10 mg oxycodone BID daily and just started taking Oxycontin 30 mg BID.  We will plan to continue these post op depending on his daily use at the time of surgery.    The patient is planning to be discharged home with home health services ([facility]) in care of his wife.  [name], PA-C 02/01/2017 10:21 AM

## 2017-02-01 DIAGNOSIS — E039 Hypothyroidism, unspecified: Secondary | ICD-10-CM | POA: Diagnosis present

## 2017-02-01 DIAGNOSIS — E785 Hyperlipidemia, unspecified: Secondary | ICD-10-CM | POA: Diagnosis present

## 2017-02-01 DIAGNOSIS — G8929 Other chronic pain: Secondary | ICD-10-CM | POA: Diagnosis present

## 2017-02-01 DIAGNOSIS — E559 Vitamin D deficiency, unspecified: Secondary | ICD-10-CM | POA: Diagnosis present

## 2017-02-01 DIAGNOSIS — M545 Low back pain: Secondary | ICD-10-CM

## 2017-02-01 DIAGNOSIS — K529 Noninfective gastroenteritis and colitis, unspecified: Secondary | ICD-10-CM | POA: Diagnosis present

## 2017-02-01 DIAGNOSIS — I739 Peripheral vascular disease, unspecified: Secondary | ICD-10-CM | POA: Diagnosis present

## 2017-02-11 ENCOUNTER — Encounter (HOSPITAL_COMMUNITY)
Admission: RE | Admit: 2017-02-11 | Discharge: 2017-02-11 | Disposition: A | Discharge status: Home / Self Care | Payer: Medicare (Managed Care) | Source: Ambulatory Visit | Attending: Orthopedic Surgery | Admitting: Orthopedic Surgery

## 2017-02-11 ENCOUNTER — Encounter (HOSPITAL_COMMUNITY): Payer: Self-pay

## 2017-02-11 DIAGNOSIS — F0391 Unspecified dementia with behavioral disturbance: Secondary | ICD-10-CM | POA: Insufficient documentation

## 2017-02-11 DIAGNOSIS — I739 Peripheral vascular disease, unspecified: Secondary | ICD-10-CM | POA: Diagnosis not present

## 2017-02-11 DIAGNOSIS — G4733 Obstructive sleep apnea (adult) (pediatric): Secondary | ICD-10-CM | POA: Diagnosis not present

## 2017-02-11 DIAGNOSIS — Z01812 Encounter for preprocedural laboratory examination: Secondary | ICD-10-CM | POA: Diagnosis not present

## 2017-02-11 DIAGNOSIS — I10 Essential (primary) hypertension: Secondary | ICD-10-CM | POA: Insufficient documentation

## 2017-02-11 DIAGNOSIS — L03211 Cellulitis of face: Secondary | ICD-10-CM | POA: Insufficient documentation

## 2017-02-11 DIAGNOSIS — G20 Parkinson's disease: Secondary | ICD-10-CM | POA: Diagnosis not present

## 2017-02-11 DIAGNOSIS — I4891 Unspecified atrial fibrillation: Secondary | ICD-10-CM | POA: Diagnosis not present

## 2017-02-11 HISTORY — DX: Dyspnea, unspecified: R06.00

## 2017-02-11 HISTORY — DX: Depression, unspecified: F32.A

## 2017-02-11 HISTORY — DX: Anxiety disorder, unspecified: F41.9

## 2017-02-11 HISTORY — DX: Inflammatory liver disease, unspecified: K75.9

## 2017-02-11 HISTORY — DX: Major depressive disorder, single episode, unspecified: F32.9

## 2017-02-11 LAB — SURGICAL PCR SCREEN
MRSA, PCR: NEGATIVE
Staphylococcus aureus: NEGATIVE

## 2017-02-11 LAB — BASIC METABOLIC PANEL
Anion gap: 9 (ref 5–15)
BUN: 12 mg/dL (ref 6–20)
CO2: 24 mmol/L (ref 22–32)
Calcium: 9.1 mg/dL (ref 8.9–10.3)
Chloride: 102 mmol/L (ref 101–111)
Creatinine, Ser: 0.93 mg/dL (ref 0.61–1.24)
GFR calc Af Amer: >60 mL/min (ref >60)
GFR calc non Af Amer: >60 mL/min (ref >60)
Glucose, Bld: 101 mg/dL — ABNORMAL HIGH (ref 65–99)
Potassium: 4 mmol/L (ref 3.5–5.1)
Sodium: 135 mmol/L (ref 135–145)

## 2017-02-11 LAB — CBC
HCT: 41 % (ref 39.0–52.0)
Hemoglobin: 14 g/dL (ref 13.0–17.0)
MCH: 33.3 pg (ref 26.0–34.0)
MCHC: 34.1 g/dL (ref 30.0–36.0)
MCV: 97.4 fL (ref 78.0–100.0)
Platelets: 248 10*3/uL (ref 150–400)
RBC: 4.21 MIL/uL — ABNORMAL LOW (ref 4.22–5.81)
RDW: 13.3 % (ref 11.5–15.5)
WBC: 9.7 10*3/uL (ref 4.0–10.5)

## 2017-02-11 NOTE — Pre-Procedure Instructions (Signed)
    Kenneth Brandt  02/11/2017      [id number] - [place] - [address] [place] [address] Phone: [phone] Fax: [phone]  [facility] - [place], [place] - [address] [place] [address] Phone: [phone] Fax: [phone]  [facility] [address] - [place], [place] - [address] [place] [address] Phone: [phone] Fax: [phone]    Your procedure is scheduled on 02-22-2017  Tuesday  [facility]:30 [facility].M.  Call this number if you have problems the morning of surgery:  [phone]   Remember:  Do not eat food or drink liquids after midnight.   Take these medicines the morning of surgery with A SIP OF WATER Tylenol if needed,albuterol in haler if needed,Bupropion(Wellbutrin),Duloxetine(Cymbalta)levothyroxine(Synthroid),Oxycodone if needed,  STOP ASPIRIN,ANTIINFLAMATORIES (IBUPROFEN,ALEVE,MOTRIN,ADVIL,GOODY'S POWDERS),HERBAL SUPPLEMENTS,FISH OIL,AND VITAMINS 5-7 DAYS PRIOR TO SURGERY   Do not wear jewelry,   Do not wear lotions, powders, or perfumes, or deoderant.  Do not shave 48 hours prior to surgery.  Men may shave face and neck.  Do not bring valuables to the hospital l.  [facility] is not responsible for any belongings or valuables.  Contacts, dentures or bridgework may not be worn into surgery.  Leave your suitcase in the car.  After surgery it may be brought to your room.  For patients admitted to the hospital, discharge time will be determined by your treatment team.  Patients discharged the day of surgery will not be allowed to drive home.      Please read over the following fact sheets that you were given. Surgical Site Infection Prevention

## 2017-02-11 NOTE — Progress Notes (Signed)
[place] [facility] [place] [facility]  Will request.   Does not see Cardiologist on regular basis  Sleep Stud done in dec. 2017 Shows that pt. Does not have Sleep apnea,  Daughter states that pt. Has had sleep Apnea as long as she can remember but he has never been able to tolerate the mask.

## 2017-02-11 NOTE — Pre-Procedure Instructions (Signed)
Kenneth Brandt  02/11/2017      [facility] - [place], [place] - [address] [place] [address] Phone: [phone] Fax: [phone]  [facility] - [place], [place] - [address] [place] [address] Phone: [phone] Fax: [phone]  [facility] [address] - [place], [place] - [address] [place] [address] Phone: [phone] Fax: [phone]    Your procedure is scheduled on Tuesday, February 22, 2017.  [facility] at [facility]:30 A.M.  Call this number if you have problems the morning of surgery:  [phone]   Remember:  Do not eat food or drink liquids after midnight.  Take these medicines the morning of surgery with A SIP OF WATER:  Acetaminophen (Tylenol) - if needed, Bupropion (Wellbutrin-XL), Carbidopa-levodopa (Sinemet IR), Cyclobenzaprine (Flexeril) - if needed, Duloxetine (Cymbalta), Gabapentin (Neurontin), Hydrocodone-acetaminophen (Norco) - if needed, Levothroxine (Synthroid), Loperamide (Imodium) - if needed, Ondansetron (Zofran) - if needed  7 days prior to surgery STOP taking any Aspirin, Aleve, Naproxen, Ibuprofen, Motrin, Advil, Goody's, BC's, all herbal medications, fish oil, and all vitamins    Do not wear jewelry, make-up or nail polish.  Do not wear lotions, powders, or perfumes, or deoderant.  Do not shave 48 hours prior to surgery.  Men may shave face and neck.  Do not bring valuables to the hospital.  [facility] is not responsible for any belongings or valuables.  Contacts, dentures or bridgework may not be worn into surgery.  Leave your suitcase in the car.  After surgery it may be brought to your room.  For patients admitted to the hospital, discharge time will be determined by your treatment team.  Patients discharged the day of surgery will not be allowed to drive home.   Name and phone number of your  driver:    Special instructions:   [facility]- Preparing For Surgery  Before surgery, you can play an important role. Because skin is not sterile, your skin needs to be as free of germs as possible. You can reduce the number of germs on your skin by washing with CHG (chlorahexidine gluconate) Soap before surgery.  CHG is an antiseptic cleaner which kills germs and bonds with the skin to continue killing germs even after washing.  Please do not use if you have an allergy to CHG or antibacterial soaps. If your skin becomes reddened/irritated stop using the CHG.  Do not shave (including legs and underarms) for at least 48 hours prior to first CHG shower. It is OK to shave your face.  Please follow these instructions carefully.   1. Shower the NIGHT BEFORE SURGERY and the MORNING OF SURGERY with CHG.   2. If you chose to wash your hair, wash your hair first as usual with your normal shampoo.  3. After you shampoo, rinse your hair and body thoroughly to remove the shampoo.  4. Use CHG as you would any other liquid soap. You can apply CHG directly to the skin and wash gently with a scrungie or a clean washcloth.   5. Apply the CHG Soap to your body ONLY FROM THE NECK DOWN.  Do not use on open wounds or open sores. Avoid contact with your eyes, ears, mouth and genitals (private parts). Wash genitals (private parts) with your normal soap.  6. Wash thoroughly, paying special attention to the area where your surgery will be performed.  7. Thoroughly rinse your body with warm water from the neck down.  8. DO NOT shower/wash with your normal soap after using and rinsing off the CHG Soap.  9. Pat yourself dry with a CLEAN TOWEL.   10. Wear CLEAN PAJAMAS   11. Place CLEAN SHEETS on your bed the night of your first shower and DO NOT SLEEP WITH PETS.    Day of Surgery: Do not apply any deodorants/lotions. Please wear clean clothes to the hospital/surgery center.      Please read over the  following fact sheets that you were given. Pain Booklet, Total Joint Packet, MRSA Information and Surgical Site Infection Prevention, and Incentive Spirometry

## 2017-02-18 ENCOUNTER — Encounter (HOSPITAL_COMMUNITY): Payer: Self-pay

## 2017-02-21 MED ORDER — ACETAMINOPHEN 500 MG PO TABS
1000.0000 mg | ORAL_TABLET | Freq: Once | ORAL | Status: DC
  Filled 2017-02-21: qty 2

## 2017-02-21 MED ORDER — CEFAZOLIN SODIUM-DEXTROSE 2-4 GM/100ML-% IV SOLN
2.0000 g | INTRAVENOUS | Status: AC
  Administered 2017-02-22: 2 g via INTRAVENOUS
  Filled 2017-02-21: qty 100

## 2017-02-21 MED ORDER — LACTATED RINGERS IV SOLN
INTRAVENOUS | Status: DC
  Administered 2017-02-22 (×2): via INTRAVENOUS

## 2017-02-21 MED ORDER — TRANEXAMIC ACID 1000 MG/10ML IV SOLN
2000.0000 mg | INTRAVENOUS | Status: AC
  Administered 2017-02-22: 2000 mg via TOPICAL
  Filled 2017-02-21: qty 20

## 2017-02-21 MED ORDER — TRANEXAMIC ACID 1000 MG/10ML IV SOLN
1000.0000 mg | INTRAVENOUS | Status: AC
  Administered 2017-02-22: 1000 mg via INTRAVENOUS
  Filled 2017-02-21: qty 10

## 2017-02-21 MED ORDER — GABAPENTIN 300 MG PO CAPS
300.0000 mg | ORAL_CAPSULE | Freq: Once | ORAL | Status: AC
  Administered 2017-02-22: 300 mg via ORAL
  Filled 2017-02-21: qty 1

## 2017-02-21 NOTE — Anesthesia Preprocedure Evaluation (Addendum)
Anesthesia Evaluation    Airway        Dental   Pulmonary sleep apnea (possible) , COPD,  COPD inhaler,           Cardiovascular hypertension, Pt. on medications + Peripheral Vascular Disease       Neuro/Psych Anxiety Depression parkinson's    GI/Hepatic   Endo/Other  Hypothyroidism Morbid obesity  Renal/GU      Musculoskeletal  (+) Arthritis ,   Abdominal   Peds  Hematology   Anesthesia Other Findings   Reproductive/Obstetrics                             Anesthesia Physical Anesthesia Plan Anesthesia Quick Evaluation

## 2017-02-22 ENCOUNTER — Inpatient Hospital Stay (HOSPITAL_COMMUNITY)
Admission: RE | Admit: 2017-02-22 | Discharge: 2017-02-23 | DRG: 470 | Disposition: A | Discharge status: Skilled Nursing Facility | Payer: Medicare (Managed Care) | Source: Ambulatory Visit | Attending: Orthopedic Surgery | Admitting: Orthopedic Surgery

## 2017-02-22 ENCOUNTER — Encounter (HOSPITAL_COMMUNITY)
Admission: RE | Disposition: A | Discharge status: Skilled Nursing Facility | Payer: Self-pay | Source: Ambulatory Visit | Attending: Orthopedic Surgery

## 2017-02-22 ENCOUNTER — Inpatient Hospital Stay (HOSPITAL_COMMUNITY): Payer: Medicare (Managed Care) | Admitting: Vascular Surgery

## 2017-02-22 ENCOUNTER — Encounter (HOSPITAL_COMMUNITY): Payer: Self-pay | Admitting: *Deleted

## 2017-02-22 ENCOUNTER — Inpatient Hospital Stay (HOSPITAL_COMMUNITY): Payer: Medicare (Managed Care)

## 2017-02-22 DIAGNOSIS — G4733 Obstructive sleep apnea (adult) (pediatric): Secondary | ICD-10-CM | POA: Diagnosis present

## 2017-02-22 DIAGNOSIS — E785 Hyperlipidemia, unspecified: Secondary | ICD-10-CM | POA: Diagnosis present

## 2017-02-22 DIAGNOSIS — I739 Peripheral vascular disease, unspecified: Secondary | ICD-10-CM | POA: Diagnosis present

## 2017-02-22 DIAGNOSIS — M5430 Sciatica, unspecified side: Secondary | ICD-10-CM | POA: Diagnosis present

## 2017-02-22 DIAGNOSIS — M1712 Unilateral primary osteoarthritis, left knee: Principal | ICD-10-CM | POA: Diagnosis present

## 2017-02-22 DIAGNOSIS — H547 Unspecified visual loss: Secondary | ICD-10-CM | POA: Diagnosis present

## 2017-02-22 DIAGNOSIS — E559 Vitamin D deficiency, unspecified: Secondary | ICD-10-CM | POA: Diagnosis present

## 2017-02-22 DIAGNOSIS — M545 Low back pain, unspecified: Secondary | ICD-10-CM | POA: Diagnosis present

## 2017-02-22 DIAGNOSIS — K529 Noninfective gastroenteritis and colitis, unspecified: Secondary | ICD-10-CM | POA: Diagnosis present

## 2017-02-22 DIAGNOSIS — Z9841 Cataract extraction status, right eye: Secondary | ICD-10-CM

## 2017-02-22 DIAGNOSIS — E039 Hypothyroidism, unspecified: Secondary | ICD-10-CM | POA: Diagnosis present

## 2017-02-22 DIAGNOSIS — M4800 Spinal stenosis, site unspecified: Secondary | ICD-10-CM | POA: Diagnosis present

## 2017-02-22 DIAGNOSIS — I10 Essential (primary) hypertension: Secondary | ICD-10-CM | POA: Diagnosis present

## 2017-02-22 DIAGNOSIS — G250 Essential tremor: Secondary | ICD-10-CM | POA: Diagnosis present

## 2017-02-22 DIAGNOSIS — G20 Parkinson's disease: Secondary | ICD-10-CM | POA: Diagnosis present

## 2017-02-22 DIAGNOSIS — Z8249 Family history of ischemic heart disease and other diseases of the circulatory system: Secondary | ICD-10-CM | POA: Diagnosis not present

## 2017-02-22 DIAGNOSIS — M25562 Pain in left knee: Secondary | ICD-10-CM | POA: Diagnosis present

## 2017-02-22 DIAGNOSIS — G3184 Mild cognitive impairment, so stated: Secondary | ICD-10-CM | POA: Diagnosis present

## 2017-02-22 DIAGNOSIS — Z96651 Presence of right artificial knee joint: Secondary | ICD-10-CM | POA: Diagnosis present

## 2017-02-22 DIAGNOSIS — Z809 Family history of malignant neoplasm, unspecified: Secondary | ICD-10-CM

## 2017-02-22 DIAGNOSIS — G8929 Other chronic pain: Secondary | ICD-10-CM | POA: Diagnosis present

## 2017-02-22 DIAGNOSIS — Z9889 Other specified postprocedural states: Secondary | ICD-10-CM

## 2017-02-22 DIAGNOSIS — Z9842 Cataract extraction status, left eye: Secondary | ICD-10-CM

## 2017-02-22 HISTORY — PX: TOTAL KNEE ARTHROPLASTY: SHX125

## 2017-02-22 SURGERY — ARTHROPLASTY, KNEE, TOTAL
Anesthesia: Spinal | Site: Knee | Laterality: Left

## 2017-02-22 MED ORDER — OXYCODONE-ACETAMINOPHEN 5-325 MG PO TABS: 1.0000 | ORAL_TABLET | ORAL | 0 refills | Status: DC | PRN

## 2017-02-22 MED ORDER — SODIUM CHLORIDE 0.9 % IR SOLN
Status: DC | PRN
  Administered 2017-02-22: 3000 mL

## 2017-02-22 MED ORDER — CLONAZEPAM 0.5 MG PO TABS
0.2500 mg | ORAL_TABLET | Freq: Every day | ORAL | Status: DC
  Administered 2017-02-22: 0.25 mg via ORAL
  Filled 2017-02-22: qty 1

## 2017-02-22 MED ORDER — LOSARTAN POTASSIUM 50 MG PO TABS
50.0000 mg | ORAL_TABLET | Freq: Every day | ORAL | Status: DC
  Administered 2017-02-22 – 2017-02-23 (×2): 50 mg via ORAL
  Filled 2017-02-22 (×2): qty 1

## 2017-02-22 MED ORDER — FENTANYL CITRATE (PF) 100 MCG/2ML IJ SOLN
INTRAMUSCULAR | Status: DC | PRN
  Administered 2017-02-22: 50 ug via INTRAVENOUS

## 2017-02-22 MED ORDER — ACETAMINOPHEN 650 MG RE SUPP: 650.0000 mg | Freq: Four times a day (QID) | RECTAL | Status: DC | PRN

## 2017-02-22 MED ORDER — DIPHENHYDRAMINE HCL 12.5 MG/5ML PO ELIX: 12.5000 mg | ORAL_SOLUTION | ORAL | Status: DC | PRN

## 2017-02-22 MED ORDER — BISACODYL 10 MG RE SUPP: 10.0000 mg | RECTAL | Status: DC | PRN

## 2017-02-22 MED ORDER — PHENYLEPHRINE 40 MCG/ML (10ML) SYRINGE FOR IV PUSH (FOR BLOOD PRESSURE SUPPORT)
PREFILLED_SYRINGE | INTRAVENOUS | Status: AC
Start: 2017-02-22 — End: 2017-02-22
  Filled 2017-02-22: qty 10

## 2017-02-22 MED ORDER — POLYETHYLENE GLYCOL 3350 17 G PO PACK: 17.0000 g | PACK | Freq: Every day | ORAL | Status: DC | PRN

## 2017-02-22 MED ORDER — CHLORHEXIDINE GLUCONATE 4 % EX LIQD: 60.0000 mL | Freq: Once | CUTANEOUS | Status: DC

## 2017-02-22 MED ORDER — 0.9 % SODIUM CHLORIDE (POUR BTL) OPTIME
TOPICAL | Status: DC | PRN
  Administered 2017-02-22: 1000 mL

## 2017-02-22 MED ORDER — FLEET ENEMA 7-19 GM/118ML RE ENEM: 1.0000 | ENEMA | RECTAL | Status: DC | PRN

## 2017-02-22 MED ORDER — METHOCARBAMOL 500 MG PO TABS
500.0000 mg | ORAL_TABLET | Freq: Four times a day (QID) | ORAL | Status: DC | PRN
  Administered 2017-02-23: 500 mg via ORAL
  Filled 2017-02-22: qty 1

## 2017-02-22 MED ORDER — ASPIRIN EC 325 MG PO TBEC: 325.0000 mg | DELAYED_RELEASE_TABLET | Freq: Every day | ORAL | 0 refills | Status: DC

## 2017-02-22 MED ORDER — HYDROMORPHONE HCL 1 MG/ML IJ SOLN
0.5000 mg | INTRAMUSCULAR | Status: DC | PRN
  Administered 2017-02-22 – 2017-02-23 (×2): 1 mg via INTRAVENOUS
  Filled 2017-02-22 (×2): qty 1

## 2017-02-22 MED ORDER — ALBUTEROL SULFATE (2.5 MG/3ML) 0.083% IN NEBU: 2.5000 mg | INHALATION_SOLUTION | Freq: Four times a day (QID) | RESPIRATORY_TRACT | Status: DC

## 2017-02-22 MED ORDER — SODIUM CHLORIDE FLUSH 0.9 % IV SOLN
INTRAVENOUS | Status: DC | PRN
  Administered 2017-02-22 (×2): 10 mL

## 2017-02-22 MED ORDER — LEVOTHYROXINE SODIUM 75 MCG PO TABS
75.0000 ug | ORAL_TABLET | Freq: Every day | ORAL | Status: DC
  Administered 2017-02-22 – 2017-02-23 (×2): 75 ug via ORAL
  Filled 2017-02-22 (×2): qty 1

## 2017-02-22 MED ORDER — DEXAMETHASONE SODIUM PHOSPHATE 10 MG/ML IJ SOLN
INTRAMUSCULAR | Status: AC
  Filled 2017-02-22: qty 1

## 2017-02-22 MED ORDER — ROPIVACAINE HCL 5 MG/ML IJ SOLN
INTRAMUSCULAR | Status: DC | PRN
  Administered 2017-02-22: 30 mL via PERINEURAL

## 2017-02-22 MED ORDER — ACETAMINOPHEN 500 MG PO TABS
1000.0000 mg | ORAL_TABLET | Freq: Three times a day (TID) | ORAL | Status: DC
  Administered 2017-02-22 – 2017-02-23 (×3): 1000 mg via ORAL
  Filled 2017-02-22 (×3): qty 2

## 2017-02-22 MED ORDER — PHENOL 1.4 % MT LIQD: 1.0000 | OROMUCOSAL | Status: DC | PRN

## 2017-02-22 MED ORDER — METHOCARBAMOL 1000 MG/10ML IJ SOLN
500.0000 mg | Freq: Four times a day (QID) | INTRAVENOUS | Status: DC | PRN
  Filled 2017-02-22: qty 5

## 2017-02-22 MED ORDER — METOCLOPRAMIDE HCL 5 MG/ML IJ SOLN: 5.0000 mg | Freq: Three times a day (TID) | INTRAMUSCULAR | Status: DC | PRN

## 2017-02-22 MED ORDER — PROPOFOL 10 MG/ML IV BOLUS
INTRAVENOUS | Status: AC
  Filled 2017-02-22: qty 20

## 2017-02-22 MED ORDER — ROTIGOTINE 2 MG/24HR TD PT24: 1.0000 | MEDICATED_PATCH | Freq: Every day | TRANSDERMAL | Status: DC

## 2017-02-22 MED ORDER — OMEPRAZOLE 20 MG PO CPDR: 20.0000 mg | DELAYED_RELEASE_CAPSULE | Freq: Every day | ORAL | 0 refills | Status: DC

## 2017-02-22 MED ORDER — MENTHOL 3 MG MT LOZG: 1.0000 | LOZENGE | OROMUCOSAL | Status: DC | PRN

## 2017-02-22 MED ORDER — ACETAMINOPHEN 325 MG PO TABS: 650.0000 mg | ORAL_TABLET | Freq: Four times a day (QID) | ORAL | Status: DC | PRN

## 2017-02-22 MED ORDER — GABAPENTIN 600 MG PO TABS
600.0000 mg | ORAL_TABLET | Freq: Three times a day (TID) | ORAL | Status: DC
  Administered 2017-02-22 – 2017-02-23 (×3): 600 mg via ORAL
  Filled 2017-02-22 (×3): qty 1

## 2017-02-22 MED ORDER — FENTANYL CITRATE (PF) 100 MCG/2ML IJ SOLN: 25.0000 ug | INTRAMUSCULAR | Status: DC | PRN

## 2017-02-22 MED ORDER — ONDANSETRON HCL 4 MG/2ML IJ SOLN
INTRAMUSCULAR | Status: DC | PRN
Start: 2017-02-22 — End: 2017-02-22
  Administered 2017-02-22: 4 mg via INTRAVENOUS

## 2017-02-22 MED ORDER — LIDOCAINE HCL (CARDIAC) 20 MG/ML IV SOLN
INTRAVENOUS | Status: DC | PRN
  Administered 2017-02-22: 50 mg via INTRAVENOUS

## 2017-02-22 MED ORDER — LOPERAMIDE HCL 2 MG PO CAPS: 4.0000 mg | ORAL_CAPSULE | ORAL | Status: DC | PRN

## 2017-02-22 MED ORDER — ONDANSETRON HCL 4 MG/2ML IJ SOLN: 4.0000 mg | Freq: Four times a day (QID) | INTRAMUSCULAR | Status: DC | PRN

## 2017-02-22 MED ORDER — SUCCINYLCHOLINE CHLORIDE 200 MG/10ML IV SOSY
PREFILLED_SYRINGE | INTRAVENOUS | Status: AC
  Filled 2017-02-22: qty 10

## 2017-02-22 MED ORDER — ASPIRIN EC 325 MG PO TBEC
325.0000 mg | DELAYED_RELEASE_TABLET | Freq: Every day | ORAL | Status: DC
  Administered 2017-02-23: 325 mg via ORAL
  Filled 2017-02-22: qty 1

## 2017-02-22 MED ORDER — BUPIVACAINE IN DEXTROSE 0.75-8.25 % IT SOLN
INTRATHECAL | Status: DC | PRN
  Administered 2017-02-22: 15 mg via INTRATHECAL

## 2017-02-22 MED ORDER — DEXAMETHASONE SODIUM PHOSPHATE 10 MG/ML IJ SOLN
10.0000 mg | Freq: Once | INTRAMUSCULAR | Status: AC
  Administered 2017-02-23: 10 mg via INTRAVENOUS
  Filled 2017-02-22: qty 1

## 2017-02-22 MED ORDER — BUPIVACAINE HCL (PF) 0.25 % IJ SOLN
INTRAMUSCULAR | Status: DC | PRN
  Administered 2017-02-22: 30 mL

## 2017-02-22 MED ORDER — PROPOFOL 500 MG/50ML IV EMUL
INTRAVENOUS | Status: DC | PRN
  Administered 2017-02-22: 25 ug/kg/min via INTRAVENOUS

## 2017-02-22 MED ORDER — OXYCODONE HCL 5 MG PO TABS
5.0000 mg | ORAL_TABLET | ORAL | Status: DC | PRN
  Administered 2017-02-22: 15 mg via ORAL
  Administered 2017-02-22: 10 mg via ORAL
  Administered 2017-02-22: 15 mg via ORAL
  Filled 2017-02-22 (×2): qty 3
  Filled 2017-02-22: qty 2

## 2017-02-22 MED ORDER — FENTANYL CITRATE (PF) 250 MCG/5ML IJ SOLN
INTRAMUSCULAR | Status: AC
  Filled 2017-02-22: qty 5

## 2017-02-22 MED ORDER — ATORVASTATIN CALCIUM 20 MG PO TABS
20.0000 mg | ORAL_TABLET | Freq: Every day | ORAL | Status: DC
  Administered 2017-02-22: 20 mg via ORAL
  Filled 2017-02-22: qty 1

## 2017-02-22 MED ORDER — SENNA 8.6 MG PO TABS
1.0000 | ORAL_TABLET | Freq: Two times a day (BID) | ORAL | Status: DC
  Administered 2017-02-22 – 2017-02-23 (×2): 8.6 mg via ORAL
  Filled 2017-02-22 (×2): qty 1

## 2017-02-22 MED ORDER — METOCLOPRAMIDE HCL 5 MG PO TABS: 5.0000 mg | ORAL_TABLET | Freq: Three times a day (TID) | ORAL | Status: DC | PRN

## 2017-02-22 MED ORDER — CEFAZOLIN SODIUM-DEXTROSE 2-4 GM/100ML-% IV SOLN
2.0000 g | Freq: Four times a day (QID) | INTRAVENOUS | Status: AC
  Administered 2017-02-22 (×2): 2 g via INTRAVENOUS
  Filled 2017-02-22 (×2): qty 100

## 2017-02-22 MED ORDER — ONDANSETRON HCL 4 MG PO TABS: 4.0000 mg | ORAL_TABLET | Freq: Four times a day (QID) | ORAL | Status: DC | PRN

## 2017-02-22 MED ORDER — DEXAMETHASONE SODIUM PHOSPHATE 10 MG/ML IJ SOLN
INTRAMUSCULAR | Status: DC | PRN
  Administered 2017-02-22: 10 mg via INTRAVENOUS

## 2017-02-22 MED ORDER — MAGNESIUM HYDROXIDE 400 MG/5ML PO SUSP: 30.0000 mL | Freq: Every day | ORAL | Status: DC | PRN

## 2017-02-22 MED ORDER — DULOXETINE HCL 60 MG PO CPEP
60.0000 mg | ORAL_CAPSULE | Freq: Two times a day (BID) | ORAL | Status: DC
  Administered 2017-02-22 – 2017-02-23 (×2): 60 mg via ORAL
  Filled 2017-02-22 (×2): qty 1

## 2017-02-22 MED ORDER — SORBITOL 70 % SOLN: 30.0000 mL | Freq: Every day | Status: DC | PRN

## 2017-02-22 MED ORDER — KETOROLAC TROMETHAMINE 30 MG/ML IJ SOLN
INTRAMUSCULAR | Status: AC
Start: 2017-02-22 — End: 2017-02-22
  Filled 2017-02-22: qty 1

## 2017-02-22 MED ORDER — BUPROPION HCL 75 MG PO TABS
75.0000 mg | ORAL_TABLET | Freq: Every day | ORAL | Status: DC
  Administered 2017-02-23: 75 mg via ORAL
  Filled 2017-02-22 (×2): qty 1

## 2017-02-22 MED ORDER — KETOROLAC TROMETHAMINE 15 MG/ML IJ SOLN
7.5000 mg | Freq: Four times a day (QID) | INTRAMUSCULAR | Status: AC
  Administered 2017-02-22 – 2017-02-23 (×4): 7.5 mg via INTRAVENOUS
  Filled 2017-02-22 (×4): qty 1

## 2017-02-22 MED ORDER — ALBUTEROL SULFATE (2.5 MG/3ML) 0.083% IN NEBU: 2.5000 mg | INHALATION_SOLUTION | Freq: Four times a day (QID) | RESPIRATORY_TRACT | Status: DC | PRN

## 2017-02-22 MED ORDER — BUPIVACAINE HCL (PF) 0.25 % IJ SOLN
INTRAMUSCULAR | Status: AC
  Filled 2017-02-22: qty 30

## 2017-02-22 MED ORDER — DOCUSATE SODIUM 100 MG PO CAPS
100.0000 mg | ORAL_CAPSULE | Freq: Two times a day (BID) | ORAL | Status: DC
  Administered 2017-02-22 – 2017-02-23 (×2): 100 mg via ORAL
  Filled 2017-02-22 (×2): qty 1

## 2017-02-22 MED ORDER — PHENYLEPHRINE HCL 10 MG/ML IJ SOLN
INTRAMUSCULAR | Status: DC | PRN
  Administered 2017-02-22: 80 ug via INTRAVENOUS
  Administered 2017-02-22: 40 ug via INTRAVENOUS
  Administered 2017-02-22: 80 ug via INTRAVENOUS
  Administered 2017-02-22: 40 ug via INTRAVENOUS
  Administered 2017-02-22: 80 ug via INTRAVENOUS
  Administered 2017-02-22: 40 ug via INTRAVENOUS

## 2017-02-22 MED ORDER — BACLOFEN 10 MG PO TABS: 10.0000 mg | ORAL_TABLET | Freq: Three times a day (TID) | ORAL | 0 refills | Status: DC | PRN

## 2017-02-22 MED ORDER — LACTATED RINGERS IV SOLN
INTRAVENOUS | Status: DC
  Administered 2017-02-22 (×2): via INTRAVENOUS

## 2017-02-22 MED ORDER — KETOROLAC TROMETHAMINE 30 MG/ML IJ SOLN
INTRAMUSCULAR | Status: DC | PRN
  Administered 2017-02-22: 30 mg

## 2017-02-22 MED ORDER — ONDANSETRON HCL 4 MG/2ML IJ SOLN
INTRAMUSCULAR | Status: AC
  Filled 2017-02-22: qty 2

## 2017-02-22 SURGICAL SUPPLY — 53 items
BANDAGE ESMARK 6X9 LF (GAUZE/BANDAGES/DRESSINGS) ×1 IMPLANT
BLADE SAG 18X100X1.27 (BLADE) ×2 IMPLANT
BNDG COHESIVE 6X5 TAN STRL LF (GAUZE/BANDAGES/DRESSINGS) ×2 IMPLANT
BNDG ELASTIC 6X10 VLCR STRL LF (GAUZE/BANDAGES/DRESSINGS) ×2 IMPLANT
BNDG ESMARK 6X9 LF (GAUZE/BANDAGES/DRESSINGS) ×2
BOWL SMART MIX CTS (DISPOSABLE) IMPLANT
CAPT KNEE TOTAL 3 ×2 IMPLANT
CEMENT BONE SIMPLEX SPEEDSET (Cement) ×4 IMPLANT
CLSR STERI-STRIP ANTIMIC 1/2X4 (GAUZE/BANDAGES/DRESSINGS) ×4 IMPLANT
COVER SURGICAL LIGHT HANDLE (MISCELLANEOUS) ×2 IMPLANT
CUFF TOURNIQUET SINGLE 34IN LL (TOURNIQUET CUFF) ×2 IMPLANT
DRAPE HALF SHEET 40X57 (DRAPES) ×2 IMPLANT
DRAPE U-SHAPE 47X51 STRL (DRAPES) ×2 IMPLANT
DRSG MEPILEX BORDER 4X12 (GAUZE/BANDAGES/DRESSINGS) ×2 IMPLANT
DURAPREP 26ML APPLICATOR (WOUND CARE) ×4 IMPLANT
ELECT CAUTERY BLADE 6.4 (BLADE) ×2 IMPLANT
ELECT REM PT RETURN 9FT ADLT (ELECTROSURGICAL) ×2
ELECTRODE REM PT RTRN 9FT ADLT (ELECTROSURGICAL) ×1 IMPLANT
FACESHIELD WRAPAROUND (MASK) ×4 IMPLANT
GLOVE BIO SURGEON STRL SZ7 (GLOVE) ×2 IMPLANT
GLOVE BIO SURGEON STRL SZ7.5 (GLOVE) ×4 IMPLANT
GLOVE BIOGEL PI IND STRL 6.5 (GLOVE) ×1 IMPLANT
GLOVE BIOGEL PI IND STRL 7.0 (GLOVE) ×3 IMPLANT
GLOVE BIOGEL PI IND STRL 8 (GLOVE) ×2 IMPLANT
GLOVE BIOGEL PI INDICATOR 6.5 (GLOVE) ×1
GLOVE BIOGEL PI INDICATOR 7.0 (GLOVE) ×3
GLOVE BIOGEL PI INDICATOR 8 (GLOVE) ×2
GOWN STRL REUS W/ TWL LRG LVL3 (GOWN DISPOSABLE) ×1 IMPLANT
GOWN STRL REUS W/ TWL XL LVL3 (GOWN DISPOSABLE) ×2 IMPLANT
GOWN STRL REUS W/TWL LRG LVL3 (GOWN DISPOSABLE) ×1
GOWN STRL REUS W/TWL XL LVL3 (GOWN DISPOSABLE) ×2
HANDPIECE INTERPULSE COAX TIP (DISPOSABLE) ×1
IMMOBILIZER KNEE 22 UNIV (SOFTGOODS) ×2 IMPLANT
KIT BASIN OR (CUSTOM PROCEDURE TRAY) ×2 IMPLANT
KIT ROOM TURNOVER OR (KITS) ×2 IMPLANT
MANIFOLD NEPTUNE II (INSTRUMENTS) ×2 IMPLANT
NEEDLE 22X1 1/2 (OR ONLY) (NEEDLE) ×2 IMPLANT
NS IRRIG 1000ML POUR BTL (IV SOLUTION) ×2 IMPLANT
PACK TOTAL JOINT (CUSTOM PROCEDURE TRAY) ×2 IMPLANT
PAD ARMBOARD 7.5X6 YLW CONV (MISCELLANEOUS) ×2 IMPLANT
SET HNDPC FAN SPRY TIP SCT (DISPOSABLE) ×1 IMPLANT
SUCTION FRAZIER HANDLE 10FR (MISCELLANEOUS) ×1
SUCTION TUBE FRAZIER 10FR DISP (MISCELLANEOUS) ×1 IMPLANT
SUT MNCRL AB 4-0 PS2 18 (SUTURE) ×2 IMPLANT
SUT MON AB 2-0 CT1 27 (SUTURE) ×2 IMPLANT
SUT VIC AB 0 CT1 27 (SUTURE) ×1
SUT VIC AB 0 CT1 27XBRD ANBCTR (SUTURE) ×1 IMPLANT
SUT VIC AB 1 CT1 27 (SUTURE) ×2
SUT VIC AB 1 CT1 27XBRD ANBCTR (SUTURE) ×2 IMPLANT
SYR 50ML LL SCALE MARK (SYRINGE) ×2 IMPLANT
TOWEL OR 17X26 10 PK STRL BLUE (TOWEL DISPOSABLE) ×2 IMPLANT
TRAY CATH 16FR W/PLASTIC CATH (SET/KITS/TRAYS/PACK) ×2 IMPLANT
TRAY FOLEY BAG SILVER LF 14FR (CATHETERS) IMPLANT

## 2017-02-22 NOTE — Anesthesia Procedure Notes (Signed)
Spinal  Patient location during procedure: OR End time: 02/22/2017 7:38 AM Staffing Anesthesiologist: Jairo BenJACKSON, Alailah Safley Performed: anesthesiologist  Preanesthetic Checklist Completed: patient identified, site marked, surgical consent, pre-op evaluation, timeout performed, IV checked, risks and benefits discussed and monitors and equipment checked Spinal Block Patient position: sitting Prep: ChloraPrep and site prepped and draped Patient monitoring: blood pressure, continuous pulse ox, cardiac monitor and heart rate Approach: midline Location: L3-4 Injection technique: single-shot Needle Needle type: Quincke  Needle gauge: 22 G Needle length: 9 cm Additional Notes Pt identified in Operating room.  Monitors applied. Working IV access confirmed. Sterile prep, drape lumbar spine.  1% lido local L 3,4.  #25ga Quincke all attempts os, #22 ga Quincke into clear CSF L 3,4.  15mg  0.75% Bupivacaine with dextrose injected with asp CSF beginning and end of injection.  Patient asymptomatic, VSS, no heme aspirated, tolerated well.  Sandford Craze Jacquis Paxton, MD

## 2017-02-22 NOTE — Anesthesia Postprocedure Evaluation (Signed)
Anesthesia Post Note  Patient: Kenneth Brandt  Procedure(s) Performed: Procedure(s) (LRB): LEFT TOTAL KNEE ARTHROPLASTY (Left)     Patient location during evaluation: PACU Anesthesia Type: Spinal Level of consciousness: patient cooperative, sedated and oriented Pain management: pain level controlled Vital Signs Assessment: post-procedure vital signs reviewed and stable Respiratory status: spontaneous breathing, nonlabored ventilation and respiratory function stable Cardiovascular status: blood pressure returned to baseline and stable Postop Assessment: no signs of nausea or vomiting and spinal receding Anesthetic complications: no    Last Vitals:  Vitals:   02/22/17 1130 02/22/17 1145  BP: 131/87 135/81  Pulse: 63 61  Resp: 15 17  Temp:  36.6 C    Last Pain:  Vitals:   02/22/17 1145  TempSrc:   PainSc: Asleep                 Wava Kildow,E. Kjersti Dittmer

## 2017-02-22 NOTE — Interval H&P Note (Signed)
History and Physical Interval Note:  02/22/2017 7:08 AM  Kenneth Brandt  has presented today for surgery, with the diagnosis of OA LEFT KNEE  The various methods of treatment have been discussed with the patient and family. After consideration of risks, benefits and other options for treatment, the patient has consented to  Procedure(s): TOTAL KNEE ARTHROPLASTY (Left) as a surgical intervention .  The patient's history has been reviewed, patient examined, no change in status, stable for surgery.  I have reviewed the patient's chart and labs.  Questions were answered to the patient's satisfaction.     MURPHY, TIMOTHY D

## 2017-02-22 NOTE — Anesthesia Procedure Notes (Signed)
Anesthesia Regional Block: Adductor canal block   Pre-Anesthetic Checklist: ,, timeout performed, Correct Patient, Correct Site, Correct Laterality, Correct Procedure, Correct Position, site marked, Risks and benefits discussed,  Surgical consent,  Pre-op evaluation,  At surgeon's request and post-op pain management  Laterality: Left and Lower  Prep: chloraprep       Needles:  Injection technique: Single-shot  Needle Type: Echogenic Needle     Needle Length: 9cm  Needle Gauge: 21     Additional Needles:   Procedures: ultrasound guided,,,,,,,,  Narrative:  Start time: 02/22/2017 7:11 AM End time: 02/22/2017 7:20 AM Injection made incrementally with aspirations every 5 mL.  Performed by: Personally  Anesthesiologist: Jean RosenthalJACKSON, Lyza Houseworth  Additional Notes: Pt identified in Holding room.  Monitors applied. Working IV access confirmed. Sterile prep, drape L thigh.  #22ga ECHOgenic needle into adductor canal with US guidance.  30cc 0.5% Ropivacaine injected incrementally after negative test dose, good spread in adductor canal.  Patient asymptomatic, VSS, no heme aspirated, tolerated well.  Sandford Craze Tambria Pfannenstiel, MD

## 2017-02-22 NOTE — Transfer of Care (Signed)
Immediate Anesthesia Transfer of Care Note  Patient: Kenneth Brandt  Procedure(s) Performed: Procedure(s): LEFT TOTAL KNEE ARTHROPLASTY (Left)  Patient Location: PACU  Anesthesia Type:MAC  Level of Consciousness: awake, alert  and oriented  Airway & Oxygen Therapy: Patient connected to nasal cannula oxygen  Post-op Assessment: Post -op Vital signs reviewed and stable  Post vital signs: stable  Last Vitals:  Vitals:   02/22/17 0635  BP: 126/80  Pulse: 78  Resp: 18  Temp: 36.9 C    Last Pain:  Vitals:   02/22/17 0635  TempSrc: Oral  PainSc:       Patients Stated Pain Goal: 2 (13/08/65 7846)  Complications: No apparent anesthesia complications

## 2017-02-22 NOTE — Anesthesia Procedure Notes (Signed)
Procedure Name: MAC Date/Time: 02/22/2017 7:50 AM Performed by: Lavell Luster Pre-anesthesia Checklist: Patient identified, Emergency Drugs available, Suction available, Patient being monitored and Timeout performed Patient Re-evaluated:Patient Re-evaluated prior to inductionOxygen Delivery Method: Nasal cannula Preoxygenation: Pre-oxygenation with 100% oxygen Intubation Type: IV induction Placement Confirmation: positive ETCO2 and breath sounds checked- equal and bilateral Dental Injury: Teeth and Oropharynx as per pre-operative assessment

## 2017-02-22 NOTE — Op Note (Signed)
DATE OF SURGERY:  02/22/2017 TIME: 8:57 AM  PATIENT NAME:  Kenneth Brandt   AGE: 82 y.o.    PRE-OPERATIVE DIAGNOSIS:  OSTEOARTHRITIS LEFT KNEE  POST-OPERATIVE DIAGNOSIS:  Same  PROCEDURE:  Procedure(s): LEFT TOTAL KNEE ARTHROPLASTY   SURGEON:  Christopher Glasscock D, MD   ASSISTANT:  Aquilla HackerHenry Martensen, PA-C, he was present and scrubbed throughout the case, critical for completion in a timely fashion, and for retraction, instrumentation, and closure.    OPERATIVE IMPLANTS: Stryker Triathlon Posterior Stabilized.  Femur size 5, Tibia size 6, Patella size 35 3-peg oval button, with a 9 mm polyethylene insert.   PREOPERATIVE INDICATIONS:  Kenneth Koshillip B Kreeger is a 82 y.o. year old male with end stage bone on bone degenerative arthritis of the knee who failed conservative treatment, including injections, antiinflammatories, activity modification, and assistive devices, and had significant impairment of their activities of daily living, and elected for Total Knee Arthroplasty.   The risks, benefits, and alternatives were discussed at length including but not limited to the risks of infection, bleeding, nerve injury, stiffness, blood clots, the need for revision surgery, cardiopulmonary complications, among others, and they were willing to proceed.   OPERATIVE DESCRIPTION:  The patient was brought to the operative room and placed in a supine position.  General anesthesia was administered.  IV antibiotics were given.  The lower extremity was prepped and draped in the usual sterile fashion.  Time out was performed.  The leg was elevated and exsanguinated and the tourniquet was inflated.  Anterior approach was performed.  The patella was everted and osteophytes were removed.  The anterior horn of the medial and lateral meniscus was removed.   The distal femur was opened with the drill and the intramedullary distal femoral cutting jig was utilized, set at 5 degrees resecting 8 mm off the distal  femur.  Care was taken to protect the collateral ligaments.  The distal femoral sizing jig was applied, taking care to avoid notching.  Then the 4-in-1 cutting jig was applied and the anterior and posterior femur was cut, along with the chamfer cuts.  All posterior osteophytes were removed.  The flexion gap was then measured and was symmetric with the extension gap.  Then the extramedullary tibial cutting jig was utilized making the appropriate cut using the anterior tibial crest as a reference building in appropriate posterior slope.  Care was taken during the cut to protect the medial and collateral ligaments.  The proximal tibia was removed along with the posterior horns of the menisci.  The PCL was sacrificed.    The extensor gap was measured and was approximately 9mm.    I completed the distal femoral preparation using the appropriate jig to prepare the box.  The patella was then measured, and cut with the saw.    The proximal tibia sized and prepared accordingly with the reamer and the punch, and then all components were trialed with the 9mm poly insert.  The knee was found to have excellent balance and full motion.    The above named components were then cemented into place and all excess cement was removed. Poly tibial piece and patella were inserted.  I was very happy with his stability and ROM  I performed a periarticular injection with marcaine and toradol  The knee was easily taken through a range of motion and the patella tracked well and the knee irrigated copiously and the parapatellar and subcutaneous tissue closed with vicryl, and monocryl with steri strips for the  skin.  The incision was dressed with sterile gauze and the tourniquet released and the patient was awakened and returned to the PACU in stable and satisfactory condition.  There were no complications.  Total tourniquet time was 70 minutes.   POSTOPERATIVE PLAN: post op Abx, DVT px: SCD's, TED's, Early ambulation  and chemical px

## 2017-02-22 NOTE — Anesthesia Preprocedure Evaluation (Addendum)
Anesthesia Evaluation  Patient identified by MRN, date of birth, ID band Patient awake    Reviewed: Allergy & Precautions, NPO status , Patient's Chart, lab work & pertinent test results  History of Anesthesia Complications Negative for: history of anesthetic complications  Airway Mallampati: III  TM Distance: >3 FB Neck ROM: Limited    Dental  (+) Dental Advisory Given, Loose, Missing, Poor Dentition, Partial Upper, Partial Lower   Pulmonary sleep apnea (does not use CPAP) , COPD (last inhaler needed a week ago),  COPD inhaler,    breath sounds clear to auscultation       Cardiovascular hypertension, Pt. on medications (-) angina+ Peripheral Vascular Disease   Rhythm:Regular Rate:Normal     Neuro/Psych negative neurological ROS     GI/Hepatic negative GI ROS, (+) Hepatitis -, A  Endo/Other  Hypothyroidism Morbid obesity  Renal/GU negative Renal ROS     Musculoskeletal  (+) Arthritis , Osteoarthritis,    Abdominal (+) + obese,   Peds  Hematology negative hematology ROS (+)   Anesthesia Other Findings   Reproductive/Obstetrics                            Anesthesia Physical Anesthesia Plan  ASA: III  Anesthesia Plan: Spinal   Post-op Pain Management:  Regional for Post-op pain   Induction:   PONV Risk Score and Plan: 1 and Ondansetron and Dexamethasone  Airway Management Planned: Natural Airway and Simple Face Mask  Additional Equipment:   Intra-op Plan:   Post-operative Plan:   Informed Consent: I have reviewed the patients History and Physical, chart, labs and discussed the procedure including the risks, benefits and alternatives for the proposed anesthesia with the patient or authorized representative who has indicated his/her understanding and acceptance.   Dental advisory given  Plan Discussed with: CRNA and Surgeon  Anesthesia Plan Comments: (Plan routine monitors,  SAB with adductor canal block for post op analgesia)       Anesthesia Quick Evaluation

## 2017-02-22 NOTE — Evaluation (Signed)
Physical Therapy Evaluation Patient Details Name: Kenneth Brandt MRN: [id number] DOB: 05/29/1935 Today's Date: 02/22/2017   History of Present Illness  Pt is a 82 y/o male s/p elective L TKA secondary to L knee OA. PMH includes parkinson's disease, restrictive lung disease, HTN, RTKA, PAD, and back surgery.   Clinical Impression  Pt is s/p elective surgery above with deficits below. PTA, [facility]. Pt reports he has been at [facility] for ~2 weeks, but prior to admission was using rollator for mobility. Pt with Parkinson's at baseline. Upon evaluation, pt limited by post op pain and weakness, as well as, decreased strength and balance. Pt also with slight difficulty sequencing with RW secondary to parkinson's. Pt requiring min A for safety for functional transfers. Recommending return to SNF at d/c to increase independence with functional mobility. Will continue to follow acutely to maximize functional mobility independence.     Follow Up Recommendations SNF (return to [facility] )    Equipment Recommendations  None recommended by PT    Recommendations for Other Services       Precautions / Restrictions Precautions Precautions: Knee Precaution Booklet Issued: Yes (comment) Precaution Comments: Reviewed TKA exercise handout with pt.  Restrictions Weight Bearing Restrictions: Yes LLE Weight Bearing: Weight bearing as tolerated      Mobility  Bed Mobility Overal bed mobility: Needs Assistance Bed Mobility: Supine to Sit     Supine to sit: Min assist     General bed mobility comments: Min A for trunk elevation and use of bed rails and elevated HOB.   Transfers Overall transfer level: Needs assistance Equipment used: Rolling walker (2 wheeled) Transfers: Sit to/from [employer];Stand Pivot Transfers Sit to Stand: Min assist Stand pivot transfers: Min assist       General transfer comment: Min A for basic transfers for  stability. Tremor noted secondary to parkinson's disease and difficulty sequencing with RW. Required manual and verbal cues for proper use of RW and to press through UE to decrease weight on LLE.   Ambulation/Gait             General Gait Details: Able to take a few steps to chair during stand pivot transfer. See above for details.   Stairs            Wheelchair Mobility    Modified Rankin (Stroke Patients Only)       Balance Overall balance assessment: Needs assistance Sitting-balance support: No upper extremity supported;Feet supported       Standing balance support: Bilateral upper extremity supported;During functional activity Standing balance-Leahy Scale: Poor Standing balance comment: Reliant on UE for support                              Pertinent Vitals/Pain Pain Assessment: 0-10 Pain Score: 6  Pain Location: L knee  Pain Descriptors / Indicators: Aching;Operative site guarding Pain Intervention(s): Limited activity within patient's tolerance;Monitored during session;Repositioned    Home Living Family/patient expects to be discharged to:: Skilled nursing facility                 Additional Comments: Back to [facility]    Prior Function Level of Independence: Needs assistance   Gait / Transfers Assistance Needed: Pt reports he used WC primarily at [facility], however, before admission to [facility] used rollator for gait.   ADL's / Homemaking Assistance Needed: Assist  required for bathing/dressing.         Hand Dominance        Extremity/Trunk Assessment   Upper Extremity Assessment Upper Extremity Assessment: Defer to OT evaluation    Lower Extremity Assessment Lower Extremity Assessment: LLE deficits/detail LLE Deficits / Details: Sensory in tact. Deficits consistent with post op pain and weakness. Able to perform exercises below.     Cervical / Trunk Assessment Cervical / Trunk Assessment: Kyphotic   Communication   Communication: No difficulties  Cognition Arousal/Alertness: Awake/alert Behavior During Therapy: WFL for tasks assessed/performed Overall Cognitive Status: Within Functional Limits for tasks assessed                                        General Comments      Exercises Total Joint Exercises Ankle Circles/Pumps: AROM;Both;10 reps;Seated (recliner ) Quad Sets: AROM;Left;10 reps;Seated Towel Squeeze: AROM;Both;10 reps;Seated Hip ABduction/ADduction: AAROM;Left;10 reps;Seated   Assessment/Plan    PT Assessment Patient needs continued PT services  PT Problem List Decreased strength;Decreased range of motion;Decreased balance;Decreased coordination;Decreased mobility;Decreased knowledge of use of DME;Decreased knowledge of precautions;Pain       PT Treatment Interventions DME instruction;Gait training;Functional mobility training;Therapeutic activities;Therapeutic exercise;Balance training;Neuromuscular re-education;Patient/family education    PT Goals (Current goals can be found in the Care Plan section)  Acute Rehab PT Goals Patient Stated Goal: to get stronger before going home  PT Goal Formulation: With patient Time For Goal Achievement: 03/01/17 Potential to Achieve Goals: Good    Frequency 7X/week   Barriers to discharge        Co-evaluation               AM-PAC PT "6 Clicks" Daily Activity  Outcome Measure Difficulty turning over in bed (including adjusting bedclothes, sheets and blankets)?: Total Difficulty moving from lying on back to sitting on the side of the bed? : Total Difficulty sitting down on and standing up from a chair with arms (e.g., wheelchair, bedside commode, etc,.)?: Total Help needed moving to and from a bed to chair (including a wheelchair)?: A Little Help needed walking in hospital room?: A Little Help needed climbing 3-5 steps with a railing? : A Lot 6 Click Score: 11    End of Session Equipment  Utilized During Treatment: Gait belt;Oxygen Activity Tolerance: Patient tolerated treatment well Patient left: in chair;with call bell/phone within reach;with nursing/sitter in room Nurse Communication: Mobility status PT Visit Diagnosis: Other abnormalities of gait and mobility (R26.89);Pain Pain - Right/Left: Left Pain - part of body: Knee    Time: 1610-9604 PT Time Calculation (min) (ACUTE ONLY): 23 min   Charges:   PT Evaluation $PT Eval Low Complexity: 1 Procedure PT Treatments $Therapeutic Activity: 8-22 mins   PT G Codes:        Gladys Damme, PT, DPT  Acute Rehabilitation Services  Pager: [phone]   Kenneth Brandt 02/22/2017, 5:00 PM

## 2017-02-22 NOTE — Progress Notes (Signed)
Orthopedic Tech Progress Note Patient Details:  Kenneth Brandt July 05, 1935 [id number]  CPM Left Knee CPM Left Knee: On Left Knee Flexion (Degrees): 90 Left Knee Extension (Degrees): 0 Additional Comments: foot roll   [name] 02/22/2017, 11:10 AM

## 2017-02-22 NOTE — Discharge Instructions (Signed)

## 2017-02-23 ENCOUNTER — Encounter (HOSPITAL_COMMUNITY): Payer: Self-pay | Admitting: General Practice

## 2017-02-23 MED ORDER — OXYCODONE HCL 5 MG PO TABS: 5.0000 mg | ORAL_TABLET | ORAL | Status: DC | PRN

## 2017-02-23 MED ORDER — HYDROMORPHONE HCL 1 MG/ML IJ SOLN: 0.5000 mg | INTRAMUSCULAR | Status: DC | PRN

## 2017-02-23 NOTE — [place] FL2 (Signed)
[place] MEDICAID FL2 LEVEL OF CARE SCREENING TOOL     IDENTIFICATION  Patient Name: Kenneth Brandt Birthdate: 1935/03/20 Sex: male Admission Date (Current Location): 02/22/2017  [facility] and [place] Number:  Producer, television/film/video and Address:  [facility] [place], [address] [place], [place] [address]      Provider Number: [id number]  Attending Physician Name and Address:  Sheral Apley, MD  Relative Name and Phone Number:       Current Level of Care: Hospital Recommended Level of Care: Skilled Nursing Facility Prior Approval Number:    Date Approved/Denied: 02/23/17 PASRR Number: [id number]  Discharge Plan: SNF    Current Diagnoses: Patient Active Problem List   Diagnosis Date Noted  . Primary osteoarthritis of left knee 02/22/2017  . Hyperlipidemia 02/01/2017  . Vitamin D deficiency 02/01/2017  . Peripheral artery disease (HCC) 02/01/2017  . Chronic diarrhea 02/01/2017  . Hypothyroidism 02/01/2017  . Chronic low back pain 02/01/2017  . Chronic pain 02/01/2017  . Facial cellulitis 02/08/2016  . Dental abscess 02/08/2016  . Dementia with behavioral disturbance 02/08/2016  . Parkinson disease (HCC) 02/08/2016  . Essential hypertension 02/08/2016    Orientation RESPIRATION BLADDER Height & Weight     Self, Time, Situation, Place  O2 (Nasal Cannula 3 L) Incontinent Weight: 246 lb (111.6 kg) Height:     BEHAVIORAL SYMPTOMS/MOOD NEUROLOGICAL BOWEL NUTRITION STATUS      Continent Diet (See DC Summary)  AMBULATORY STATUS COMMUNICATION OF NEEDS Skin   Limited Assist Verbally Surgical wounds (Closed Left Leg Incision with compression wrap)                       Personal Care Assistance Level of Assistance  Bathing, Feeding, Dressing Bathing Assistance: Limited assistance Feeding assistance: Limited assistance Dressing Assistance: Limited assistance     Functional Limitations Info  Sight, Hearing, Speech Sight Info:  Adequate Hearing Info: Adequate Speech Info: Adequate    SPECIAL CARE FACTORS FREQUENCY  PT (By licensed PT)     PT Frequency: 5xweek              Contractures      Additional Factors Info  Code Status, Allergies, Psychotropic Code Status Info: FULL Allergies Info: NKA Psychotropic Info: wellbutrin, klonopin, cymbalta         Current Medications (02/23/2017):  This is the current hospital active medication list Current Facility-Administered Medications  Medication Dose Route Frequency Provider Last Rate Last Dose  . acetaminophen (TYLENOL) tablet 650 mg  650 mg Oral Q6H PRN Albina Billet III, PA-C       Or  . acetaminophen (TYLENOL) suppository 650 mg  650 mg Rectal Q6H PRN Albina Billet III, PA-C      . acetaminophen (TYLENOL) tablet 1,000 mg  1,000 mg Oral Q8H Martensen, Lucretia Kern III, PA-C   1,000 mg at 02/23/17 5621  . albuterol (PROVENTIL) (2.5 MG/3ML) 0.083% nebulizer solution 2.5 mg  2.5 mg Inhalation QID PRN Loreta Ave, MD      . aspirin EC tablet 325 mg  325 mg Oral Q breakfast Albina Billet III, PA-C   325 mg at 02/23/17 0820  . atorvastatin (LIPITOR) tablet 20 mg  20 mg Oral q1800 Albina Billet III, PA-C   20 mg at 02/22/17 1800  . bisacodyl (DULCOLAX) suppository 10 mg  10 mg Rectal PRN Albina Billet III, PA-C      . buPROPion [facility]) tablet  75 mg  75 mg Oral Daily Albina Billet III, PA-C   75 mg at 02/23/17 8119  . clonazePAM (KLONOPIN) tablet 0.25 mg  0.25 mg Oral QHS Albina Billet III, PA-C   0.25 mg at 02/22/17 2233  . diphenhydrAMINE (BENADRYL) 12.5 MG/5ML elixir 12.5-25 mg  12.5-25 mg Oral Q4H PRN Albina Billet III, PA-C      . docusate sodium (COLACE) capsule 100 mg  100 mg Oral BID Albina Billet III, PA-C   100 mg at 02/23/17 0820  . DULoxetine (CYMBALTA) DR capsule 60 mg  60 mg Oral BID Albina Billet III, PA-C   60 mg at 02/23/17 1478  .  gabapentin (NEURONTIN) tablet 600 mg  600 mg Oral TID Albina Billet III, PA-C   600 mg at 02/23/17 0820  . HYDROmorphone (DILAUDID) injection 0.5-1 mg  0.5-1 mg Intravenous Q4H PRN Albina Billet III, PA-C      . levothyroxine (SYNTHROID, LEVOTHROID) tablet 75 mcg  75 mcg Oral QAC breakfast Albina Billet III, PA-C   75 mcg at 02/23/17 0820  . loperamide (IMODIUM) capsule 4 mg  4 mg Oral PRN Albina Billet III, PA-C      . losartan (COZAAR) tablet 50 mg  50 mg Oral Daily Albina Billet III, PA-C   50 mg at 02/23/17 0820  . magnesium hydroxide (MILK OF MAGNESIA) suspension 30 mL  30 mL Oral Daily PRN Albina Billet III, PA-C      . menthol-cetylpyridinium (CEPACOL) lozenge 3 mg  1 lozenge Oral PRN Albina Billet III, PA-C       Or  . phenol (CHLORASEPTIC) mouth spray 1 spray  1 spray Mouth/Throat PRN Albina Billet III, PA-C      . methocarbamol (ROBAXIN) tablet 500 mg  500 mg Oral Q6H PRN Albina Billet III, PA-C   500 mg at 02/23/17 2956   Or  . methocarbamol (ROBAXIN) 500 mg in dextrose 5 % 50 mL IVPB  500 mg Intravenous Q6H PRN Albina Billet III, PA-C      . metoCLOPramide (REGLAN) tablet 5-10 mg  5-10 mg Oral Q8H PRN Albina Billet III, PA-C       Or  . metoCLOPramide (REGLAN) injection 5-10 mg  5-10 mg Intravenous Q8H PRN Albina Billet III, PA-C      . ondansetron (ZOFRAN) tablet 4 mg  4 mg Oral Q6H PRN Albina Billet III, PA-C       Or  . ondansetron (ZOFRAN) injection 4 mg  4 mg Intravenous Q6H PRN Albina Billet III, PA-C      . oxyCODONE (Oxy IR/ROXICODONE) immediate release tablet 5-15 mg  5-15 mg Oral Q4H PRN Albina Billet III, PA-C      . polyethylene glycol (MIRALAX / GLYCOLAX) packet 17 g  17 g Oral Daily PRN Albina Billet III, PA-C      . rotigotine (NEUPRO) 2 MG/24HR 1 patch  1 patch Transdermal Daily Martensen, Lucretia Kern III, PA-C       . senna (SENOKOT) tablet 8.6 mg  1 tablet Oral BID Albina Billet III, PA-C   8.6 mg at 02/23/17 0820  . sodium phosphate (FLEET) 7-19 GM/118ML enema 1 enema  1 enema Rectal PRN Albina Billet III, PA-C      . sorbitol 70 % solution 30 mL  30 mL Oral Daily PRN Albina Billet III, PA-C  Discharge Medications: Please see discharge summary for a list of discharge medications.  Relevant Imaging Results:  Relevant Lab Results:   Additional Information ss:[id number]  Tresa MoorePatricia V Enolia Koepke, LCSW

## 2017-02-23 NOTE — Progress Notes (Signed)
Pt discharge education and instructions completed. Report called off to nurse Aram Beechamynthia at [facility]. Pt LLE incision dsg remains intact with ace wrap. Pt transported off unit via wheelchair by [facility] transport along with his belongings and prescriptions to the side. Arabella MerlesP. Amo Ramina Hulet RN.

## 2017-02-23 NOTE — Progress Notes (Signed)
OT Screen   02/23/17 1000  OT Visit Information  Last OT Received On 02/23/17  Reason Eval/Treat Not Completed OT screened, no needs identified, will sign off. Pt admitted from SNF and planning to return to SNF for further OT. Will defer further OT needs to post acute rehab. Thank you.   Stephens Shreve MSOT, OTR/L Acute Rehab Pager: [phone] Office: [phone]

## 2017-02-23 NOTE — Progress Notes (Signed)
Physical Therapy Treatment Patient Details Name: Kenneth Brandt MRN: [id number] DOB: [birth year] Today's Date: 02/23/2017    History of Present Illness Pt is a 82 y/o male s/p elective L TKA secondary to L knee OA. PMH includes parkinson's disease, restrictive lung disease, HTN, RTKA, PAD, and back surgery.     PT Comments    Pt progressed well today, ambulating 775' with RW and min-guard A that progressed to min A as tremor increased with increased distance. Pt performed exercises as well as being able to verbalize appropriate position on L knee in CPM and in extension when not in CPM. PT will continue to follow.    Follow Up Recommendations  SNF (return to [facility] )     Equipment Recommendations  None recommended by PT    Recommendations for Other Services       Precautions / Restrictions Precautions Precautions: Knee Precaution Comments: reviewed proper positioning Restrictions Weight Bearing Restrictions: Yes LLE Weight Bearing: Weight bearing as tolerated    Mobility  Bed Mobility Overal bed mobility: Needs Assistance Bed Mobility: Supine to Sit;Sit to Supine     Supine to sit: Supervision Sit to supine: Supervision   General bed mobility comments: pt able to get out of and back into bed with heavy use of rail but no physical assist.   Transfers Overall transfer level: Needs assistance Equipment used: Rolling walker (2 wheeled) Transfers: Sit to/from Stand Sit to Stand: Min guard         General transfer comment: pt able to stand to RW without physical assist, supervision for safety and vc's for controlled sit  Ambulation/Gait Ambulation/Gait assistance: Min guard;+2 safety/equipment;Min assist Ambulation Distance (Feet): 75 Feet Assistive device: Rolling walker (2 wheeled) Gait Pattern/deviations: Step-through pattern;Decreased weight shift to left;Decreased stance time - left;Decreased step length - left;Decreased step length - right Gait  velocity: decreased Gait velocity interpretation: Below normal speed for age/gender General Gait Details: tremor noted with increased distance, chair brought behind pt. Min-guard A at beginning but min A as tremor increased   Stairs            Wheelchair Mobility    Modified Rankin (Stroke Patients Only)       Balance Overall balance assessment: Needs assistance Sitting-balance support: No upper extremity supported;Feet supported Sitting balance-Leahy Scale: Good     Standing balance support: Bilateral upper extremity supported;During functional activity Standing balance-Leahy Scale: Poor Standing balance comment: Reliant on UE for support                             Cognition Arousal/Alertness: Awake/alert Behavior During Therapy: WFL for tasks assessed/performed Overall Cognitive Status: Within Functional Limits for tasks assessed                                        Exercises Total Joint Exercises Ankle Circles/Pumps: AROM;Both;10 reps;Supine (recliner ) Quad Sets: AROM;Left;10 reps;Supine Heel Slides: AROM;Left;10 reps;Supine Hip ABduction/ADduction: Left;10 reps;Supine;AROM Straight Leg Raises: AROM;Left;10 reps;Supine Long Arc Quad: AROM;Left;10 reps;Seated Knee Flexion: AROM;Left;10 reps;Seated Goniometric ROM: 0-90    General Comments        Pertinent Vitals/Pain Pain Assessment: 0-10 Pain Score: 2  Pain Location: L knee  Pain Descriptors / Indicators: Aching Pain Intervention(s): Limited activity within patient's tolerance;Monitored during session;Premedicated before session    Home Living  Prior Function            PT Goals (current goals can now be found in the care plan section) Acute Rehab PT Goals Patient Stated Goal: to get stronger before going home  PT Goal Formulation: With patient Time For Goal Achievement: 03/01/17 Potential to Achieve Goals: Good Progress towards PT  goals: Progressing toward goals    Frequency    7X/week      PT Plan Current plan remains appropriate    Co-evaluation              AM-PAC PT "6 Clicks" Daily Activity  Outcome Measure  Difficulty turning over in bed (including adjusting bedclothes, sheets and blankets)?: A Little Difficulty moving from lying on back to sitting on the side of the bed? : A Little Difficulty sitting down on and standing up from a chair with arms (e.g., wheelchair, bedside commode, etc,.)?: Total Help needed moving to and from a bed to chair (including a wheelchair)?: A Little Help needed walking in hospital room?: A Little Help needed climbing 3-5 steps with a railing? : A Lot 6 Click Score: 15    End of Session Equipment Utilized During Treatment: Gait belt Activity Tolerance: Patient tolerated treatment well Patient left: with call bell/phone within reach;in bed;in CPM (0-90) Nurse Communication: Mobility status PT Visit Diagnosis: Other abnormalities of gait and mobility (R26.89);Pain Pain - Right/Left: Left Pain - part of body: Knee     Time: [id number] PT Time Calculation (min) (ACUTE ONLY): 29 min  Charges:  $Gait Training: 8-22 mins $Therapeutic Exercise: 8-22 mins                    G Codes:       Lyanne CoVictoria Emmaleigh Longo, PT  Acute Rehab Services  [phone]    TurkeyVictoria L Miryam Mcelhinney 02/23/2017, 1:32 PM

## 2017-02-23 NOTE — Discharge Summary (Signed)
Discharge Summary  Patient ID: RUI WORDELL MRN: [id number] DOB/AGE: 1935-03-26 82 y.o.  Admit date: 02/22/2017 Discharge date: 02/23/2017  Admission Diagnoses:  Primary osteoarthritis of left knee  Discharge Diagnoses:  Principal Problem:   Primary osteoarthritis of left knee Active Problems:   Parkinson disease (HCC)   Essential hypertension   Hyperlipidemia   Vitamin D deficiency   Peripheral artery disease (HCC)   Chronic diarrhea   Hypothyroidism   Chronic low back pain   Chronic pain   Past Medical History:  Diagnosis Date  . Anxiety   . Arthritis   . Dementia   . Depression   . Dyspnea    occasionally  . Hepatitis    A  . Hypercholesteremia   . Hypertension   . Hypothyroid   . OSA (obstructive sleep apnea)    questionable  . PAD (peripheral artery disease) (HCC)   . Parkinson disease (HCC)     Surgeries: Procedure(s): LEFT TOTAL KNEE ARTHROPLASTY on 02/22/2017   Consultants (if any):   Discharged Condition: Improved  Hospital Course: SAURAV CRUMBLE is an 82 y.o. male who was admitted 02/22/2017 with a diagnosis of Primary osteoarthritis of left knee and went to the operating room on 02/22/2017 and underwent the above named procedures.    He was given perioperative antibiotics:  Anti-infectives    Start     Dose/Rate Route Frequency Ordered Stop   02/22/17 1330  ceFAZolin (ANCEF) IVPB 2g/100 mL premix     2 g 200 mL/hr over 30 Minutes Intravenous Every 6 hours 02/22/17 1214 02/22/17 2000   02/22/17 0700  ceFAZolin (ANCEF) IVPB 2g/100 mL premix     2 g 200 mL/hr over 30 Minutes Intravenous To [facility] 02/21/17 1124 02/22/17 0810    .  He was given sequential compression devices, early ambulation, and Aspirin 325 mg for DVT prophylaxis.  He benefited maximally from the hospital stay and there were no complications.    Recent vital signs:  Vitals:   02/23/17 0019 02/23/17 0428  BP: 114/63 125/65  Pulse: 84 78  Resp: 18 18   Temp: 98.5 F (36.9 C) 98.4 F (36.9 C)    Recent laboratory studies:  Lab Results  Component Value Date   HGB 14.0 02/11/2017   HGB 13.3 01/15/2017   HGB 13.0 02/11/2016   Lab Results  Component Value Date   WBC 9.7 02/11/2017   PLT 248 02/11/2017   No results found for: INR Lab Results  Component Value Date   NA 135 02/11/2017   K 4.0 02/11/2017   CL 102 02/11/2017   CO2 24 02/11/2017   BUN 12 02/11/2017   CREATININE 0.93 02/11/2017   GLUCOSE 101 (H) 02/11/2017    Discharge Medications:   Allergies as of 02/23/2017      Reactions   No Known Allergies       Medication List    STOP taking these medications   albuterol 108 (90 Base) MCG/ACT inhaler Commonly known as:  PROVENTIL HFA;VENTOLIN HFA   aspirin 81 MG tablet Replaced by:  aspirin EC 325 MG tablet   HYDROcodone-acetaminophen 5-325 MG tablet Commonly known as:  NORCO/VICODIN   Oxycodone HCl 10 MG Tabs     TAKE these medications   acetaminophen 500 MG tablet Commonly known as:  TYLENOL Take 1 tablet (500 mg total) by mouth every 8 (eight) hours as needed (pain). What changed:  how much to take  when to take this   aspirin EC 325  MG tablet Take 1 tablet (325 mg total) by mouth daily. For 30 days post op for DVT Prophylaxis Replaces:  aspirin 81 MG tablet   atorvastatin 20 MG tablet Commonly known as:  LIPITOR Take 20 mg by mouth daily.   bisacodyl 10 MG suppository Commonly known as:  DULCOLAX Place 10 mg rectally as needed for moderate constipation.   buPROPion 75 MG tablet Commonly known as:  WELLBUTRIN Take 75 mg by mouth every morning.   clonazePAM 0.5 MG tablet Commonly known as:  KLONOPIN Take 0.25 mg by mouth at bedtime.   DULoxetine 60 MG capsule Commonly known as:  CYMBALTA Take 60 mg by mouth 2 (two) times daily.   gabapentin 600 MG tablet Commonly known as:  NEURONTIN Take 600 mg by mouth 3 (three) times daily.   levothyroxine 75 MCG tablet Commonly known as:   SYNTHROID, LEVOTHROID Take 75 mcg by mouth daily before breakfast.   loperamide 2 MG capsule Commonly known as:  IMODIUM Take 4 mg by mouth as needed for diarrhea or loose stools.   loratadine 10 MG tablet Commonly known as:  CLARITIN Take 10 mg by mouth daily as needed for allergies.   losartan 50 MG tablet Commonly known as:  COZAAR Take 50 mg by mouth daily.   magnesium hydroxide 400 MG/5ML suspension Commonly known as:  MILK OF MAGNESIA Take 30 mLs by mouth as needed for constipation.   omeprazole 20 MG capsule Commonly known as:  PRILOSEC Take 1 capsule (20 mg total) by mouth daily. 30 days for gastroprotection while taking Aspirin.   ondansetron 4 MG disintegrating tablet Commonly known as:  ZOFRAN ODT Take 1 tablet (4 mg total) by mouth every 8 (eight) hours as needed for nausea or vomiting.   oxyCODONE-acetaminophen 5-325 MG tablet Commonly known as:  ROXICET Take 1-2 tablets by mouth every 4 (four) hours as needed for severe pain.   RA SALINE ENEMA 19-7 GM/118ML Enem Place 1 each rectally as needed (for constipation).   rotigotine 2 MG/24HR Commonly known as:  NEUPRO Place 1 patch onto the skin daily.   senna 8.6 MG Tabs tablet Commonly known as:  SENOKOT Take 2 tablets by mouth at bedtime as needed for mild constipation.   simethicone 125 MG chewable tablet Commonly known as:  MYLICON Chew 125 mg by mouth 4 (four) times daily.   valACYclovir 1000 MG tablet Commonly known as:  VALTREX Take 1,000 mg by mouth See admin instructions. Take 1000 mg by mouth twice daily for one day only then as needed at first sign of cold   Vitamin D3 50000 units Caps Take 50,000 Units by mouth every 30 (thirty) days. On the 18th       Diagnostic Studies: Dg Knee 1-2 Views Left  Result Date: 02/22/2017 CLINICAL DATA:  Status post left total knee arthroplasty this morning. Moderate pain. EXAM: LEFT KNEE - 1-2 VIEW COMPARISON:  None impacts FINDINGS: The prosthetic joint  appears to be in appropriate position radiographically. The native bone exhibits no acute abnormality. There is air in fluid within the anterior aspect of the joint and subcutaneous tissues. IMPRESSION: There is no acute abnormality of the prosthetic knee nor of the native bone. Electronically Signed   By: David  [place] M.D.   On: 02/22/2017 15:23    Disposition: SNF - Return to [facility].   Follow-up Information    Sheral Apley, MD Follow up.   Specialty:  Orthopedic Surgery Contact information: [address] [place] [id number] [phone]            Signed: [name] PA-C 02/23/2017, 8:13 AM

## 2017-02-23 NOTE — Social Work (Signed)
Clinical Social Worker facilitated patient discharge including contacting patient family and facility to confirm patient discharge plans.  Clinical information faxed to facility and family agreeable with plan.   CSW arranged [facility].    RN to call [phone] to give report prior to discharge.  Clinical Social Worker will sign off for now as social work intervention is no longer needed. Please consult us again if new need arises.  Keene BreathPatricia Arlesia Kiel, LCSW Clinical Social Worker [phone]

## 2017-02-23 NOTE — Clinical Social Work Note (Addendum)
Clinical Social Work Assessment  Patient Details  Name: Kenneth Brandt MRN: [id number] Date of Birth: 09/02/1934  Date of referral:  02/23/17               Reason for consult:  Facility Placement                Permission sought to share information with:  Facility Art therapist granted to share information::  Yes, Verbal Permission Granted  Name::        Agency::  SNF  Relationship::     Contact Information:     Housing/Transportation Living arrangements for the past 2 months:  [place], [place] of Information:  Facility, Other (Comment Required) ([facility], Health and safety inspector) Patient Interpreter Needed:  None Criminal Activity/Legal Involvement Pertinent to Current Situation/Hospitalization:  No - Comment as needed Significant Relationships:  Other Family Members, Community Support Lives with:  Facility Resident Do you feel safe going back to the place where you live?  No Need for family participation in patient care:  Yes (Comment)  Care giving concerns:  [facility] from [facility] and will return to SNF for continued rehabilitation. Not safe to return home at this time. Resides at home with spouse who is unable to care for him at this time.   Pt managed by [facility] and was advised by Education officer, museum that they would transport patient to facility.  Social Worker assessment / plan:  CSW met with patient to discuss dc recommendations for SNF placement.  Pt aware of plan and has knowledge of SNF placement.  CSW obtained permission to send out offer to [facility] for a return placement. SNF expecting patient and followed up on dc information.  Passr confirmed. FL2 complete. Offer sent through hub to [facility].   Employment status:  Retired Nurse, adult PT Recommendations:  [place] / Referral to community resources:  [place]  Patient/Family's  Response to care:  Patient appreciative of CSW assistance. No issues identified at this time.  Patient/Family's Understanding of and Emotional Response to Diagnosis, Current Treatment, and Prognosis:  Patient has good understanding of diagnosis, current treatment and prognosis. Pt hopeful he will improve so that he can get back home with spouse. No issues or concerns at this time.  Emotional Assessment Appearance:  Appears stated age Attitude/Demeanor/Rapport:   (Cooperative) Affect (typically observed):  Accepting, Appropriate Orientation:  Oriented to Self, Oriented to Place, Oriented to  Time, Oriented to Situation Alcohol / Substance use:  Not Applicable Psych involvement (Current and /or in the community):  No (Comment)  Discharge Needs  Concerns to be addressed:  Care Coordination Readmission within the last 30 days:  Yes Current discharge risk:  Physical Impairment, Dependent with Mobility Barriers to Discharge:  No Barriers Identified   Normajean Baxter, LCSW 02/23/2017, 11:23 AM

## 2017-02-23 NOTE — Clinical Social Work Placement (Signed)
   CLINICAL SOCIAL WORK PLACEMENT  NOTE  Date:  02/23/2017  Patient Details  Name: Kenneth Brandt MRN: [id number] Date of Birth: [birth year]  Clinical Social Work is seeking post-discharge placement for this patient at the Skilled  Nursing Facility level of care (*CSW will initial, date and re-position this form in  chart as items are completed):  Yes   Patient/family provided with [place] Clinical Social Work Department's list of facilities offering this level of care within the geographic area requested by the patient (or if unable, by the patient's family).  Yes   Patient/family informed of their freedom to choose among providers that offer the needed level of care, that participate in Medicare, Medicaid or managed care program needed by the patient, have an available bed and are willing to accept the patient.  Yes   Patient/family informed of [facility]'s ownership interest in [facility] and [facility], as well as of the fact that they are under no obligation to receive care at these facilities.  PASRR submitted to EDS on       PASRR number received on 02/22/17     Existing PASRR number confirmed on 02/22/17     FL2 transmitted to all facilities in geographic area requested by pt/family on 02/22/17     FL2 transmitted to all facilities within larger geographic area on 02/22/17     Patient informed that his/her managed care company has contracts with or will negotiate with certain facilities, including the following:        Yes   Patient/family informed of bed offers received.  Patient chooses bed at [facility]     Physician recommends and patient chooses bed at      Patient to be transferred to [facility] on 02/23/17.  Patient to be transferred to facility by [facility]     Patient family notified on 02/16/17 of transfer.  Name of family member notified:  patient responsible for self, daughter was communicated with  about dc to [facility]     PHYSICIAN Please prepare priority discharge summary, including medications, Please prepare prescriptions, Please sign FL2     Additional Comment:    _______________________________________________ Tresa MoorePatricia V Nguyet Mercer, LCSW 02/23/2017, 11:38 AM

## 2017-02-23 NOTE — Progress Notes (Addendum)
   Assessment / Plan: 1 Day Post-Op  S/P Procedure(s) (LRB): LEFT TOTAL KNEE ARTHROPLASTY (Left) by Dr. Jewel Baizeimothy D. Eulah Brandt on 02/22/17  Active Problems:   Parkinson disease (HCC)   Essential hypertension   Hyperlipidemia   Vitamin D deficiency   Peripheral artery disease (HCC)   Chronic diarrhea   Hypothyroidism   Chronic low back pain   Chronic pain   Primary osteoarthritis of left knee  Wean IV pain medication  Advance diet Up with therapy D/C IV fluids Incentive Spirometry - Wean O2 when able.  He has been sating well, but on 3L. Elevate and apply ice Bone Foam / CPM  Weight Bearing: Weight Bearing as Tolerated (WBAT)  Dressings: Maintain Mepilex.  VTE prophylaxis: Aspirin, SCDs, ambulation Dispo: Skilled Nursing Facility/Rehab  - He will return to [facility].  Later today after PT.  Subjective: Patient reports pain as moderate. Pain controlled with IV and PO meds.  Tolerating diet.  Urinating.  Little Flatus.  No CP, SOB.  OOB standing and to chair.  Objective:   VITALS:   Vitals:   02/22/17 1844 02/22/17 2055 02/23/17 0019 02/23/17 0428  BP:  134/70 114/63 125/65  Pulse:  95 84 78  Resp:  18 18 18   Temp:  98.4 F (36.9 C) 98.5 F (36.9 C) 98.4 F (36.9 C)  TempSrc:  Oral Oral Oral  SpO2: 94% 98% 95% 100%  Weight:       CBC Latest Ref Rng & Units 02/11/2017 01/15/2017 02/11/2016  WBC 4.0 - 10.5 K/uL 9.7 8.9 6.3  Hemoglobin 13.0 - 17.0 g/dL 16.114.0 09.613.3 04.513.0  Hematocrit 39.0 - 52.0 % 41.0 38.2(L) 38.1(L)  Platelets 150 - 400 K/uL 248 216 220   BMP Latest Ref Rng & Units 02/11/2017 01/15/2017 02/09/2016  Glucose 65 - 99 mg/dL 409(W101(H) 119(J124(H) 96  BUN 6 - 20 mg/dL 12 13 10   Creatinine 0.61 - 1.24 mg/dL 4.780.93 2.950.81 6.210.86  Sodium 135 - 145 mmol/L 135 136 136  Potassium 3.5 - 5.1 mmol/L 4.0 3.5 3.5  Chloride 101 - 111 mmol/L 102 104 101  CO2 22 - 32 mmol/L 24 23 28   Calcium 8.9 - 10.3 mg/dL 9.1 9.0 3.0(Q8.8(L)   Intake/Output      06/26 0701 - 06/27 0700 06/27 0701 -  06/28 0700   P.O. 480    I.V. (mL/kg) 1275 (11.4)    Total Intake(mL/kg) 1755 (15.7)    Urine (mL/kg/hr) 500 (0.2)    Blood 150 (0.1)    Total Output 650     Net +1105          Urine Occurrence 3 x      Physical Exam: General: NAD.  Supine in bed.  Calm, conversant.  Sibley in place. Resp: No increased wob Cardio: regular rate and rhythm ABD soft, protuberant Neurologically intact MSK Neurovascularly intact Sensation intact distally Feet warm Dorsiflexion/Plantar flexion intact Incision: dressing C/D/I   Kenneth BilletHenry Calvin Martensen III, PA-C 02/23/2017, 7:23 AM

## 2017-04-07 ENCOUNTER — Ambulatory Visit (HOSPITAL_COMMUNITY)
Admission: RE | Admit: 2017-04-07 | Discharge: 2017-04-07 | Disposition: A | Discharge status: Home / Self Care | Payer: Medicare (Managed Care) | Source: Ambulatory Visit | Attending: Internal Medicine | Admitting: Internal Medicine

## 2017-04-07 ENCOUNTER — Other Ambulatory Visit (HOSPITAL_COMMUNITY): Payer: Self-pay | Admitting: Orthopedic Surgery

## 2017-04-07 DIAGNOSIS — M79605 Pain in left leg: Secondary | ICD-10-CM

## 2017-04-07 DIAGNOSIS — M7989 Other specified soft tissue disorders: Secondary | ICD-10-CM | POA: Diagnosis not present

## 2017-04-07 NOTE — Progress Notes (Addendum)
*  PRELIMINARY RESULTS* Vascular Ultrasound Left lower extremity venous duplex has been completed.  Preliminary findings: No evidence of deep vein thrombosis in the visualized veins of the left lower extremity.  Difficult visualization of calf veins due to edema.  Small baker's cyst noted in the left popliteal fossa.  Preliminary results called to Dr. Greig RightMurphy's office at 16:35, given to [facility].   Chauncey FischerCharlotte C Zenovia Justman 04/07/2017, 4:43 PM

## 2017-09-06 ENCOUNTER — Other Ambulatory Visit: Payer: Self-pay | Admitting: Internal Medicine

## 2017-09-06 DIAGNOSIS — R569 Unspecified convulsions: Secondary | ICD-10-CM

## 2017-09-15 ENCOUNTER — Ambulatory Visit
Admission: RE | Admit: 2017-09-15 | Discharge: 2017-09-15 | Disposition: A | Discharge status: Home / Self Care | Payer: No Typology Code available for payment source | Source: Ambulatory Visit | Attending: Internal Medicine | Admitting: Internal Medicine

## 2017-09-15 ENCOUNTER — Other Ambulatory Visit: Payer: Medicare (Managed Care)

## 2017-09-15 DIAGNOSIS — R569 Unspecified convulsions: Secondary | ICD-10-CM

## 2017-12-08 ENCOUNTER — Other Ambulatory Visit (HOSPITAL_COMMUNITY): Payer: Self-pay | Admitting: Internal Medicine

## 2017-12-08 DIAGNOSIS — R609 Edema, unspecified: Secondary | ICD-10-CM

## 2017-12-15 ENCOUNTER — Ambulatory Visit (HOSPITAL_COMMUNITY)
Admission: RE | Admit: 2017-12-15 | Discharge: 2017-12-15 | Disposition: A | Discharge status: Home / Self Care | Payer: Medicare (Managed Care) | Source: Ambulatory Visit | Attending: Internal Medicine | Admitting: Internal Medicine

## 2017-12-15 DIAGNOSIS — I10 Essential (primary) hypertension: Secondary | ICD-10-CM | POA: Diagnosis not present

## 2017-12-15 DIAGNOSIS — R600 Localized edema: Secondary | ICD-10-CM | POA: Insufficient documentation

## 2017-12-15 DIAGNOSIS — R609 Edema, unspecified: Secondary | ICD-10-CM

## 2017-12-15 DIAGNOSIS — E785 Hyperlipidemia, unspecified: Secondary | ICD-10-CM | POA: Insufficient documentation

## 2017-12-15 NOTE — Progress Notes (Signed)
  Echocardiogram 2D Echocardiogram has been performed.  Kable Haywood T Caliph Borowiak 12/15/2017, 10:58 AM

## 2018-02-23 ENCOUNTER — Other Ambulatory Visit: Payer: Self-pay | Admitting: Internal Medicine

## 2018-02-23 DIAGNOSIS — M545 Low back pain, unspecified: Secondary | ICD-10-CM

## 2018-02-23 DIAGNOSIS — G8929 Other chronic pain: Secondary | ICD-10-CM

## 2018-03-03 ENCOUNTER — Ambulatory Visit
Admission: RE | Admit: 2018-03-03 | Discharge: 2018-03-03 | Disposition: A | Discharge status: Home / Self Care | Payer: Medicare (Managed Care) | Source: Ambulatory Visit | Attending: Internal Medicine | Admitting: Internal Medicine

## 2018-03-03 DIAGNOSIS — M545 Low back pain, unspecified: Secondary | ICD-10-CM

## 2018-03-03 DIAGNOSIS — G8929 Other chronic pain: Secondary | ICD-10-CM

## 2018-03-06 ENCOUNTER — Other Ambulatory Visit (HOSPITAL_COMMUNITY): Payer: Self-pay | Admitting: Interventional Radiology

## 2018-03-06 DIAGNOSIS — S32010A Wedge compression fracture of first lumbar vertebra, initial encounter for closed fracture: Secondary | ICD-10-CM

## 2018-03-10 ENCOUNTER — Other Ambulatory Visit (HOSPITAL_COMMUNITY): Payer: Self-pay | Admitting: Internal Medicine

## 2018-03-10 DIAGNOSIS — S32010A Wedge compression fracture of first lumbar vertebra, initial encounter for closed fracture: Secondary | ICD-10-CM

## 2018-03-13 ENCOUNTER — Other Ambulatory Visit: Payer: Self-pay | Admitting: Interventional Radiology

## 2018-03-14 ENCOUNTER — Other Ambulatory Visit: Payer: Self-pay

## 2018-03-14 ENCOUNTER — Ambulatory Visit (HOSPITAL_COMMUNITY)
Admission: RE | Admit: 2018-03-14 | Discharge: 2018-03-14 | Disposition: A | Discharge status: Home / Self Care | Payer: Medicare (Managed Care) | Source: Ambulatory Visit | Attending: Internal Medicine | Admitting: Internal Medicine

## 2018-03-14 ENCOUNTER — Encounter (HOSPITAL_COMMUNITY): Payer: Self-pay

## 2018-03-14 DIAGNOSIS — Z7982 Long term (current) use of aspirin: Secondary | ICD-10-CM | POA: Diagnosis not present

## 2018-03-14 DIAGNOSIS — M1990 Unspecified osteoarthritis, unspecified site: Secondary | ICD-10-CM | POA: Diagnosis not present

## 2018-03-14 DIAGNOSIS — G4733 Obstructive sleep apnea (adult) (pediatric): Secondary | ICD-10-CM | POA: Diagnosis not present

## 2018-03-14 DIAGNOSIS — Z8249 Family history of ischemic heart disease and other diseases of the circulatory system: Secondary | ICD-10-CM | POA: Insufficient documentation

## 2018-03-14 DIAGNOSIS — Y93E9 Activity, other interior property and clothing maintenance: Secondary | ICD-10-CM | POA: Insufficient documentation

## 2018-03-14 DIAGNOSIS — F419 Anxiety disorder, unspecified: Secondary | ICD-10-CM | POA: Insufficient documentation

## 2018-03-14 DIAGNOSIS — E039 Hypothyroidism, unspecified: Secondary | ICD-10-CM | POA: Insufficient documentation

## 2018-03-14 DIAGNOSIS — B159 Hepatitis A without hepatic coma: Secondary | ICD-10-CM | POA: Diagnosis not present

## 2018-03-14 DIAGNOSIS — F0280 Dementia in other diseases classified elsewhere without behavioral disturbance: Secondary | ICD-10-CM | POA: Insufficient documentation

## 2018-03-14 DIAGNOSIS — F329 Major depressive disorder, single episode, unspecified: Secondary | ICD-10-CM | POA: Diagnosis not present

## 2018-03-14 DIAGNOSIS — X500XXA Overexertion from strenuous movement or load, initial encounter: Secondary | ICD-10-CM | POA: Diagnosis not present

## 2018-03-14 DIAGNOSIS — M5126 Other intervertebral disc displacement, lumbar region: Secondary | ICD-10-CM | POA: Diagnosis not present

## 2018-03-14 DIAGNOSIS — I10 Essential (primary) hypertension: Secondary | ICD-10-CM | POA: Insufficient documentation

## 2018-03-14 DIAGNOSIS — S32010A Wedge compression fracture of first lumbar vertebra, initial encounter for closed fracture: Secondary | ICD-10-CM

## 2018-03-14 DIAGNOSIS — I739 Peripheral vascular disease, unspecified: Secondary | ICD-10-CM | POA: Insufficient documentation

## 2018-03-14 DIAGNOSIS — G20 Parkinson's disease: Secondary | ICD-10-CM | POA: Diagnosis not present

## 2018-03-14 DIAGNOSIS — E7800 Pure hypercholesterolemia, unspecified: Secondary | ICD-10-CM | POA: Diagnosis not present

## 2018-03-14 DIAGNOSIS — S32019A Unspecified fracture of first lumbar vertebra, initial encounter for closed fracture: Secondary | ICD-10-CM | POA: Diagnosis present

## 2018-03-14 HISTORY — PX: IR KYPHO LUMBAR INC FX REDUCE BONE BX UNI/BIL CANNULATION INC/IMAGING: IMG5519

## 2018-03-14 LAB — BASIC METABOLIC PANEL
Anion gap: 9 (ref 5–15)
BUN: 25 mg/dL — ABNORMAL HIGH (ref 8–23)
CO2: 29 mmol/L (ref 22–32)
Calcium: 9.3 mg/dL (ref 8.9–10.3)
Chloride: 104 mmol/L (ref 98–111)
Creatinine, Ser: 1.22 mg/dL (ref 0.61–1.24)
GFR calc Af Amer: >60 mL/min (ref >60)
GFR calc non Af Amer: 54 mL/min — ABNORMAL LOW (ref >60)
Glucose, Bld: 103 mg/dL — ABNORMAL HIGH (ref 70–99)
Potassium: 3.6 mmol/L (ref 3.5–5.1)
Sodium: 142 mmol/L (ref 135–145)

## 2018-03-14 LAB — CBC
HCT: 40.2 % (ref 39.0–52.0)
Hemoglobin: 13.1 g/dL (ref 13.0–17.0)
MCH: 32.5 pg (ref 26.0–34.0)
MCHC: 32.6 g/dL (ref 30.0–36.0)
MCV: 99.8 fL (ref 78.0–100.0)
Platelets: 242 10*3/uL (ref 150–400)
RBC: 4.03 MIL/uL — ABNORMAL LOW (ref 4.22–5.81)
RDW: 12.9 % (ref 11.5–15.5)
WBC: 8.1 10*3/uL (ref 4.0–10.5)

## 2018-03-14 LAB — PROTIME-INR
INR: 1.11
Prothrombin Time: 14.2 seconds (ref 11.4–15.2)

## 2018-03-14 LAB — APTT: aPTT: 30 seconds (ref 24–36)

## 2018-03-14 MED ORDER — TOBRAMYCIN SULFATE 1.2 G IJ SOLR
INTRAMUSCULAR | Status: AC
  Administered 2018-03-14: 0.1 g
  Filled 2018-03-14: qty 1.2

## 2018-03-14 MED ORDER — FENTANYL CITRATE (PF) 100 MCG/2ML IJ SOLN
INTRAMUSCULAR | Status: AC | PRN
  Administered 2018-03-14 (×2): 25 ug via INTRAVENOUS

## 2018-03-14 MED ORDER — CEFAZOLIN SODIUM-DEXTROSE 2-4 GM/100ML-% IV SOLN
2.0000 g | Freq: Once | INTRAVENOUS | Status: AC
  Administered 2018-03-14: 2 g via INTRAVENOUS

## 2018-03-14 MED ORDER — SODIUM CHLORIDE 0.9 % IV SOLN: INTRAVENOUS | Status: AC

## 2018-03-14 MED ORDER — SODIUM CHLORIDE 0.9 % IV SOLN
INTRAVENOUS | Status: DC
  Administered 2018-03-14: 11:00:00 via INTRAVENOUS

## 2018-03-14 MED ORDER — BUPIVACAINE HCL 0.25 % IJ SOLN
INTRAMUSCULAR | Status: AC | PRN
  Administered 2018-03-14: 15 mL

## 2018-03-14 MED ORDER — BUPIVACAINE HCL (PF) 0.5 % IJ SOLN
INTRAMUSCULAR | Status: AC
  Filled 2018-03-14: qty 30

## 2018-03-14 MED ORDER — CEFAZOLIN SODIUM-DEXTROSE 2-4 GM/100ML-% IV SOLN
INTRAVENOUS | Status: AC
  Filled 2018-03-14: qty 100

## 2018-03-14 MED ORDER — MIDAZOLAM HCL 2 MG/2ML IJ SOLN
INTRAMUSCULAR | Status: AC
  Filled 2018-03-14: qty 2

## 2018-03-14 MED ORDER — MIDAZOLAM HCL 2 MG/2ML IJ SOLN
INTRAMUSCULAR | Status: AC | PRN
  Administered 2018-03-14 (×2): 1 mg via INTRAVENOUS

## 2018-03-14 MED ORDER — IOPAMIDOL (ISOVUE-300) INJECTION 61%
INTRAVENOUS | Status: AC
  Administered 2018-03-14: 1 mL
  Filled 2018-03-14: qty 50

## 2018-03-14 MED ORDER — FENTANYL CITRATE (PF) 100 MCG/2ML IJ SOLN
INTRAMUSCULAR | Status: AC
  Filled 2018-03-14: qty 2

## 2018-03-14 NOTE — H&P (Addendum)
Chief Complaint: Patient was seen in consultation today for Lumbar 1 Kyphoplasty at the request of Miguel Aschoff  Referring Physician(s): Dorice Lamas, Lacretia Nicks  Supervising Physician: Gilmer Mor  Patient Status: [facility] - Out-pt  History of Present Illness: Kenneth Brandt is a 83 y.o. male    Noted sudden back pain after reaching up to clean ceiling fan approx 1 mo ago Pain has gotten worse Pian meds - no relief MR 03/03/18:  IMPRESSION: 1. Acute L1 compression fracture with up to 40% height loss and 3 mm bony retropulsion. This is benign/osteoporotic in appearance. 2. 6 mm anterolisthesis of L4 on L5 with associated severe facet degeneration, with resultant moderate to severe left and moderate right L4 foraminal narrowing. Facet disease could serve as a source for lower back pain. 3. Right subarticular disc protrusion at L3-4, contacting the descending right L4 nerve root in the right lateral recess  Scheduled for kyphoplasty with Dr Loreta Ave per imaging Consultation performed today. Now scheduled for L1 KP  Was seen by Dr Vernie Ammons last week for occasional painful urination Cystoscopy was negative per pt. Was given Pyridium for pain-- no antibiotic   Past Medical History:  Diagnosis Date  . Anxiety   . Arthritis   . Dementia   . Depression   . Dyspnea    occasionally  . Hepatitis    A  . Hypercholesteremia   . Hypertension   . Hypothyroid   . OSA (obstructive sleep apnea)    questionable  . PAD (peripheral artery disease) (HCC)   . Parkinson disease [facility])     Past Surgical History:  Procedure Laterality Date  . ANKLE FRACTURE SURGERY Right   . APPENDECTOMY     remote  . BACK SURGERY  2009, 2011   laminectomies  . CARDIOVASCULAR STRESS TEST     04/25/15 Nuclear stress test [facility]): No definite evidence for inducible ischemia or infarct, EF 74%  . CATARACT EXTRACTION Bilateral   . REPLACEMENT TOTAL KNEE Right 2012  . RETINAL  DETACHMENT SURGERY Right   . TOTAL KNEE ARTHROPLASTY Left 02/22/2017  . TOTAL KNEE ARTHROPLASTY Left 02/22/2017   Procedure: LEFT TOTAL KNEE ARTHROPLASTY;  Surgeon: Sheral Apley, MD;  Location: [facility] OR;  Service: Orthopedics;  Laterality: Left;    Allergies: No known allergies  Medications: Prior to Admission medications   Medication Sig Start Date End Date Taking? Authorizing Provider  acetaminophen (TYLENOL) 500 MG tablet Take 1 tablet (500 mg total) by mouth every 8 (eight) hours as needed (pain). Patient taking differently: Take 1,000 mg by mouth 3 (three) times daily.  02/11/16   Arrien, York Ram, MD  aspirin EC 325 MG tablet Take 1 tablet (325 mg total) by mouth daily. For 30 days post op for DVT Prophylaxis 02/22/17   Albina Billet III, PA-C  atorvastatin (LIPITOR) 20 MG tablet Take 20 mg by mouth daily.    [provider]  bisacodyl (DULCOLAX) 10 MG suppository Place 10 mg rectally as needed for moderate constipation.    [provider]  buPROPion (WELLBUTRIN) 75 MG tablet Take 75 mg by mouth every morning.    [provider]  Cholecalciferol (VITAMIN D3) 50000 units CAPS Take 50,000 Units by mouth every 30 (thirty) days. On the 18th    [provider]  clonazePAM (KLONOPIN) 0.5 MG tablet Take 0.25 mg by mouth at bedtime.    [provider]  DULoxetine (CYMBALTA) 60 MG capsule Take 60 mg by mouth 2 (two)  times daily.    [provider]  gabapentin (NEURONTIN) 600 MG tablet Take 600 mg by mouth 3 (three) times daily.     [provider]  levothyroxine (SYNTHROID, LEVOTHROID) 75 MCG tablet Take 75 mcg by mouth daily before breakfast.    [provider]  loperamide (IMODIUM) 2 MG capsule Take 4 mg by mouth as needed for diarrhea or loose stools.     [provider]  loratadine (CLARITIN) 10 MG tablet Take 10 mg by mouth daily as needed for allergies.    [provider]  losartan  (COZAAR) 50 MG tablet Take 50 mg by mouth daily.    [provider]  magnesium hydroxide (MILK OF MAGNESIA) 400 MG/5ML suspension Take 30 mLs by mouth as needed for constipation.    [provider]  omeprazole (PRILOSEC) 20 MG capsule Take 1 capsule (20 mg total) by mouth daily. 30 days for gastroprotection while taking Aspirin. 02/22/17   Albina Billet III, PA-C  ondansetron (ZOFRAN ODT) 4 MG disintegrating tablet Take 1 tablet (4 mg total) by mouth every 8 (eight) hours as needed for nausea or vomiting. Patient not taking: Reported on 02/11/2017 01/15/17   Horton, Mayer Masker, MD  oxyCODONE-acetaminophen (ROXICET) 5-325 MG tablet Take 1-2 tablets by mouth every 4 (four) hours as needed for severe pain. 02/22/17   Albina Billet III, PA-C  rotigotine (NEUPRO) 2 MG/24HR Place 1 patch onto the skin daily.    [provider]  senna (SENOKOT) 8.6 MG TABS tablet Take 2 tablets by mouth at bedtime as needed for mild constipation.    [provider]  simethicone (MYLICON) 125 MG chewable tablet Chew 125 mg by mouth 4 (four) times daily.    [provider]  Sodium Phosphates (RA SALINE ENEMA) 19-7 GM/118ML ENEM Place 1 each rectally as needed (for constipation).    [provider]  valACYclovir (VALTREX) 1000 MG tablet Take 1,000 mg by mouth See admin instructions. Take 1000 mg by mouth twice daily for one day only then as needed at first sign of cold    [provider]     Family History  Problem Relation Age of Onset  . Cancer Mother   . Pneumonia Mother   . CAD Father   . CAD Brother   . Cancer Other   . Stroke Neg Hx     Social History   Socioeconomic History  . Marital status: Married    Spouse name: Beecher Mcardle  . Number of children: 3  . Years of education: college  . Highest education level: Not on file  Occupational History    Comment: former Education officer, environmental, missionary  Social Needs  . Financial resource strain: Not  on file  . Food insecurity:    Worry: Not on file    Inability: Not on file  . Transportation needs:    Medical: Not on file    Non-medical: Not on file  Tobacco Use  . Smoking status: Never Smoker  . Smokeless tobacco: Never Used  Substance and Sexual Activity  . Alcohol use: No  . Drug use: No  . Sexual activity: Not on file  Lifestyle  . Physical activity:    Days per week: Not on file    Minutes per session: Not on file  . Stress: Not on file  Relationships  . Social connections:    Talks on phone: Not on file    Gets together: Not on file    Attends  religious service: Not on file    Active member of club or organization: Not on file    Attends meetings of clubs or organizations: Not on file    Relationship status: Not on file  Other Topics Concern  . Not on file  Social History Narrative   Lives at home with wife, caregiver   Caffeine- coffee, 1 cup daily, coke occasionally    Review of Systems: A 12 point ROS discussed and pertinent positives are indicated in the HPI above.  All other systems are negative.  Review of Systems  Constitutional: Positive for activity change. Negative for fatigue and fever.  Respiratory: Negative for shortness of breath.   Cardiovascular: Negative for chest pain.  Gastrointestinal: Negative for abdominal pain.  Musculoskeletal: Positive for back pain. Negative for gait problem.  Neurological: Negative for weakness.  Psychiatric/Behavioral: Negative for behavioral problems and confusion.    Vital Signs: BP 126/73   Pulse 77   Temp 97.8 F (36.6 C)   Resp 18   Ht 5\' 7"  (1.702 m)   Wt 248 lb (112.5 kg)   SpO2 94%   BMI 38.84 kg/m   Physical Exam  Constitutional: He is oriented to person, place, and time.  Cardiovascular: Normal rate, regular rhythm and normal heart sounds.  Pulmonary/Chest: Effort normal and breath sounds normal.  Abdominal: Soft. Bowel sounds are normal.  Musculoskeletal: Normal range of motion.  Low  back pain Moves all 4s B = strength  Neurological: He is alert and oriented to person, place, and time.  Skin: Skin is warm and dry.  Psychiatric: He has a normal mood and affect. His behavior is normal. Judgment and thought content normal.  Nursing note and vitals reviewed.   Imaging: Mr Lumbar Spine Wo Contrast  Result Date: 03/03/2018 CLINICAL DATA:  Initial evaluation for chronic bilateral low back pain, no radiculopathy. EXAM: MRI LUMBAR SPINE WITHOUT CONTRAST TECHNIQUE: Multiplanar, multisequence MR imaging of the lumbar spine was performed. No intravenous contrast was administered. COMPARISON:  None available. FINDINGS: Segmentation:  Examination moderately degraded by motion artifact. Normal segmentation. Lowest well-formed disc labeled the L5-S1 level. Alignment: 6 mm anterolisthesis of L4 on L5. 3 mm retrolisthesis of L3 on L4. Vertebrae: Acute compression fracture extends through the superior endplate of L1 with up to 40% height loss with trace 3 mm bony retropulsion. This is benign/mechanical in appearance with no underlying pathologic lesion. Additional compression deformity involving the superior endplate of T12 is chronic in appearance. Vertebral body heights otherwise maintained. Bone marrow signal intensity within normal limits. No discrete or worrisome osseous lesions. Mild reactive edema about the right L4-5 facet due to facet arthritis. Conus medullaris and cauda equina: Conus extends to the L1-2 level. Conus and cauda equina appear normal. Paraspinal and other soft tissues: Paraspinous soft tissues demonstrate no acute abnormality. T2 hyperintense right renal cyst partially visualized. Dilatation of the intraabdominal aorta up to 3.1 cm in diameter noted. There is apparent asymmetric appearance of the subcutaneous fat of the left flank, posterior to the left iliac wing on axial sequences (series 13, image 46), favored to be secondary to technique, although a fatty lesion such is a  lipoma not entirely excluded. No discrete mass seen within this region on prior abdominal CT from 01/15/2017. Disc levels: T12-L1: Mild disc bulge with disc desiccation. 3 mm bony retropulsion related to the L1 compression fracture. Flattening of the ventral thecal sac without significant spinal stenosis. Foramina remain patent. L1-2: Diffuse disc bulge with disc desiccation and  intervertebral disc space narrowing. No significant canal stenosis. Mild bilateral L1 foraminal narrowing. L2-3: Mild diffuse disc bulge with disc desiccation. Mild facet and ligament flavum hypertrophy. No significant stenosis. L3-4: Diffuse disc bulge with disc desiccation. Superimposed right subarticular disc protrusion with inferior migration, extends into the right lateral recess and contacts the descending right L4 nerve root (series 13, image 31). Mild facet and ligament flavum hypertrophy. Resultant mild canal with moderate bilateral lateral recess narrowing, greater on the right. Mild-to-moderate bilateral L3 foraminal stenosis. L4-5: 6 mm anterolisthesis, chronic and facet mediated. Associated broad posterior disc bulge. Severe bilateral facet degeneration with associated reactive effusions. Mild reactive edema about the right L4-5 facet. Resultant mild spinal stenosis. Moderate to severe left with moderate right L4 foraminal stenosis. L5-S1: Mild disc bulge with disc desiccation. Severe right with moderate left facet hypertrophy. Resultant mild lateral recess stenosis bilaterally without significant canal narrowing. Foramina remain patent. IMPRESSION: 1. Acute L1 compression fracture with up to 40% height loss and 3 mm bony retropulsion. This is benign/osteoporotic in appearance. 2. 6 mm anterolisthesis of L4 on L5 with associated severe facet degeneration, with resultant moderate to severe left and moderate right L4 foraminal narrowing. Facet disease could serve as a source for lower back pain. 3. Right subarticular disc  protrusion at L3-4, contacting the descending right L4 nerve root in the right lateral recess. 4. Asymmetric appearance of the subcutaneous fat within the left flank, posterior to the left iliac wing and superficial to the left gluteal musculature. Finding favored to be artifactual/secondary to technique on this exam, although a fatty lesion such is a lipoma not entirely excluded. Correlation with physical exam recommended. 5. **An incidental finding of potential clinical significance has been found. 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 Recommend followup by ultrasound in 3 years. This recommendation follows ACR consensus guidelines: White Paper of the ACR Incidental Findings Committee II on Vascular Findings. Earlyne Iba Radiol 2013; 69:629-528** Electronically Signed   By: Rise Mu M.D.   On: 03/03/2018 16:23    Labs:  CBC: No results for input(s): WBC, HGB, HCT, PLT in the last 8760 hours.  COAGS: No results for input(s): INR, APTT in the last 8760 hours.  BMP: No results for input(s): NA, K, CL, CO2, GLUCOSE, BUN, CALCIUM, CREATININE, GFRNONAA, GFRAA in the last 8760 hours.  Invalid input(s): CMP  LIVER FUNCTION TESTS: No results for input(s): BILITOT, AST, ALT, ALKPHOS, PROT, ALBUMIN in the last 8760 hours.  TUMOR MARKERS: No results for input(s): AFPTM, CEA, CA199, CHROMGRNA in the last 8760 hours.  Assessment and Plan:  Painful Lumbar 1 fracture Pain meds are not relieving pain Scheduled for L1 Kyphoplasty Risks and benefits of L1 KP were discussed with the patient including, but not limited to education regarding the natural healing process of compression fractures without intervention, bleeding, infection, cement migration which may cause spinal cord damage, paralysis, pulmonary embolism or even death.  This interventional procedure involves the use of X-rays and because of the nature of the planned procedure, it is possible that we will  have prolonged use of X-ray fluoroscopy.  Potential radiation risks to you include (but are not limited to) the following: - A slightly elevated risk for cancer  several years later in life. This risk is typically less than 0.5% percent. This risk is low in comparison to the normal incidence of human cancer, which is 33% for women and 50% for men according to the American Cancer Society. -  Radiation induced injury can include skin redness, resembling a rash, tissue breakdown / ulcers and hair loss (which can be temporary or permanent).   The likelihood of either of these occurring depends on the difficulty of the procedure and whether you are sensitive to radiation due to previous procedures, disease, or genetic conditions.   IF your procedure requires a prolonged use of radiation, you will be notified and given written instructions for further action.  It is your responsibility to monitor the irradiated area for the 2 weeks following the procedure and to notify your physician if you are concerned that you have suffered a radiation induced injury.    All of the patient's questions were answered, patient is agreeable to proceed.  Consent signed and in chart.  Thank you for this interesting consult.  I greatly enjoyed meeting Kenneth Brandt and look forward to participating in their care.  A copy of this report was sent to the requesting provider on this date.  Electronically Signed: Milena Liggett A, PA-C 03/14/2018, 11:12 AM   I spent a total of  30 Minutes   in face to face in clinical consultation, greater than 50% of which was counseling/coordinating care for L1 KP

## 2018-03-14 NOTE — Procedures (Signed)
S/P L1 balloon KP 

## 2018-03-14 NOTE — Discharge Instructions (Addendum)
Do not stoop for two weeks, use walker at all times for two weeks,  Percutaneous Vertebroplasty, Care After These instructions give you information on caring for yourself after your procedure. Your doctor may also give you more specific instructions. Call your doctor if you have any problems or questions after your procedure. Follow these instructions at home:  Take medicine as told by your doctor.  Keep your wound dry and covered for 24 hours or as told by your doctor.  Ask your doctor when you can bathe or shower.  Put an ice pack on your wound. ? Put ice in a plastic bag. ? Place a towel between your skin and the bag. ? Leave the ice on for 15-20 minutes, 3-4 times a day.  Rest in your bed for 24 hours or as told by your doctor.  Return to normal activities as told by your doctor.  Ask your doctor what stretches and exercises you can do.  Do not bend or lift anything heavy as told by your doctor. Contact a doctor if:  Your wound becomes red, puffy (swollen), or tender to the touch.  You are bleeding or leaking fluid from the wound.  You are sick to your stomach (nauseous) or throw up (vomit) for more than 24 hours after the procedure.  Your back pain does not get better.  You have a fever. Get help right away if:  You have bad back pain that comes on suddenly.  You cannot control when you pee (urinate) or poop (bowel movement).  You lose feeling (numbness) or have tingling in your legs or feet, or they become weak.  You have sudden weakness in your arms or legs.  You have shooting pain down your legs.  You have chest pain or a hard time breathing.  You feel dizzy or pass out (faint).  Your vision changes or you cannot talk as you normally do. This information is not intended to replace advice given to you by your health care provider. Make sure you discuss any questions you have with your health care provider. Document Released: 11/10/2009 Document Revised:  01/22/2016 Document Reviewed: 04/24/2013 Elsevier Interactive Patient Education  2018 [employer]. Balloon Kyphoplasty Balloon kyphoplasty is a procedure to treat a spinal compression fracture, which is a collapse of the bones that form the spine (vertebrae). With this type of fracture, the vertebrae become squashed (compressed) into a wedge shape, and this causes pain. In this procedure, the collapsed vertebrae are expanded with a balloon, and bone cement is injected into them to strengthen them. Tell a health care provider about:  Any allergies you have.  All medicines you are taking, including vitamins, herbs, eye drops, creams, and over-the-counter medicines.  Any problems you or family members have had with anesthetic medicines.  Any blood disorders you have.  Any surgeries you have had.  Any medical conditions you have.  Whether you are pregnant or may be pregnant. What are the risks? Generally, this is a safe procedure. However, problems may occur, including:  Infection.  Bleeding.  Allergic reactions to medicines.  Damage to other structures or organs.  Leaking of bone cement into other parts of the body.  What happens before the procedure?  Follow instructions from your health care provider about eating or drinking restrictions.  Ask your health care provider about: ? Changing or stopping your regular medicines. This is especially important if you are taking diabetes medicines or blood thinners. ? Taking medicines such as aspirin and ibuprofen.  These medicines can thin your blood. Do not take these medicines before your procedure if your health care provider instructs you not to.  Ask your health care provider how your surgical site will be marked or identified.  You may be given antibiotic medicine to help prevent infection.  Do not use tobacco products, including cigarettes, chewing tobacco, or e-cigarettes. If you need help quitting, ask your health care  provider.  Plan to have someone take you home after the procedure.  If you go home right after the procedure, plan to have someone with you for 24 hours. What happens during the procedure?  To reduce your risk of infection: ? Your health care team will wash or sanitize their hands. ? Your skin will be washed with soap.  An IV tube will be inserted into one of your veins.  You will be given one or more of the following: ? A medicine to help you relax (sedative). ? A medicine to numb the area (local anesthetic). ? A medicine to make you fall asleep (general anesthetic).  Your surgeon will use an X-ray machine to see your spinal compression fracture.  Two small incisions will be made near your spine.  A thin tube will be inserted into your spine. Through this tube, the balloon will be placed in your spine where the fractures are.  The balloon will be inflated. This will create space and push the bone back toward its normal height and shape.  The balloon will be removed.  The newly created space in your spine will be filled with bone cement.  When the cement hardens, the tube in your spine will be removed.  Your incisions will be closed with stitches (sutures), skin glue, or adhesive strips.  A bandage (dressing) may be used to cover your incisions. The procedure may vary among health care providers and hospitals. What happens after the procedure?  Your blood pressure, heart rate, breathing rate, and blood oxygen level will be monitored often until the medicines you were given have worn off.  You will have some pain. Pain medicine will be available to help you. This information is not intended to replace advice given to you by your health care provider. Make sure you discuss any questions you have with your health care provider. Document Released: 07/22/2004 Document Revised: 01/22/2016 Document Reviewed: 12/09/2014 Elsevier Interactive Patient Education  2018 Elsevier  Inc. Moderate Conscious Sedation, Adult, Care After These instructions provide you with information about caring for yourself after your procedure. Your health care provider may also give you more specific instructions. Your treatment has been planned according to current medical practices, but problems sometimes occur. Call your health care provider if you have any problems or questions after your procedure. What can I expect after the procedure? After your procedure, it is common:  To feel sleepy for several hours.  To feel clumsy and have poor balance for several hours.  To have poor judgment for several hours.  To vomit if you eat too soon.  Follow these instructions at home: For at least 24 hours after the procedure:   Do not: ? Participate in activities where you could fall or become injured. ? Drive. ? Use heavy machinery. ? Drink alcohol. ? Take sleeping pills or medicines that cause drowsiness. ? Make important decisions or sign legal documents. ? Take care of children on your own.  Rest. Eating and drinking  Follow the diet recommended by your health care provider.  If you vomit: ?  Drink water, juice, or soup when you can drink without vomiting. ? Make sure you have little or no nausea before eating solid foods. General instructions  Have a responsible adult stay with you until you are awake and alert.  Take over-the-counter and prescription medicines only as told by your health care provider.  If you smoke, do not smoke without supervision.  Keep all follow-up visits as told by your health care provider. This is important. Contact a health care provider if:  You keep feeling nauseous or you keep vomiting.  You feel light-headed.  You develop a rash.  You have a fever. Get help right away if:  You have trouble breathing. This information is not intended to replace advice given to you by your health care provider. Make sure you discuss any questions  you have with your health care provider. Document Released: 06/06/2013 Document Revised: 01/19/2016 Document Reviewed: 12/06/2015 Elsevier Interactive Patient Education  [employer]2018 Elsevier Inc.

## 2018-03-15 ENCOUNTER — Encounter (HOSPITAL_COMMUNITY): Payer: Self-pay | Admitting: Interventional Radiology

## 2018-03-23 ENCOUNTER — Ambulatory Visit (INDEPENDENT_AMBULATORY_CARE_PROVIDER_SITE_OTHER): Payer: Medicare (Managed Care) | Admitting: Podiatry

## 2018-03-23 ENCOUNTER — Encounter (HOSPITAL_COMMUNITY): Payer: Self-pay

## 2018-03-23 ENCOUNTER — Ambulatory Visit (HOSPITAL_COMMUNITY): Payer: Medicare (Managed Care)

## 2018-03-23 DIAGNOSIS — M21962 Unspecified acquired deformity of left lower leg: Secondary | ICD-10-CM

## 2018-03-23 DIAGNOSIS — M779 Enthesopathy, unspecified: Secondary | ICD-10-CM

## 2018-03-23 DIAGNOSIS — Q828 Other specified congenital malformations of skin: Secondary | ICD-10-CM

## 2018-03-23 NOTE — Progress Notes (Signed)
Subjective:  Patient ID: Kenneth Brandt, male    DOB: 1935-08-19,  MRN: [id number]  Chief Complaint  Patient presents with  . Callouses    left  3rd metatarsal, plantar aspect    83 y.o. male presents with the above complaint.  Past Medical History:  Diagnosis Date  . Anxiety   . Arthritis   . Dementia   . Depression   . Dyspnea    occasionally  . Hepatitis    A  . Hypercholesteremia   . Hypertension   . Hypothyroid   . OSA (obstructive sleep apnea)    questionable  . PAD (peripheral artery disease) (HCC)   . Parkinson disease [facility])    Past Surgical History:  Procedure Laterality Date  . ANKLE FRACTURE SURGERY Right   . APPENDECTOMY     remote  . BACK SURGERY  2009, 2011   laminectomies  . CARDIOVASCULAR STRESS TEST     04/25/15 Nuclear stress test [facility]): No definite evidence for inducible ischemia or infarct, EF 74%  . CATARACT EXTRACTION Bilateral   . IR KYPHO LUMBAR INC FX REDUCE BONE BX UNI/BIL CANNULATION INC/IMAGING  03/14/2018  . REPLACEMENT TOTAL KNEE Right 2012  . RETINAL DETACHMENT SURGERY Right   . TOTAL KNEE ARTHROPLASTY Left 02/22/2017  . TOTAL KNEE ARTHROPLASTY Left 02/22/2017   Procedure: LEFT TOTAL KNEE ARTHROPLASTY;  Surgeon: Sheral Apley, MD;  Location: [facility] OR;  Service: Orthopedics;  Laterality: Left;    Current Outpatient Medications:  .  acetaminophen (TYLENOL) 500 MG tablet, Take 1 tablet (500 mg total) by mouth every 8 (eight) hours as needed (pain). (Patient taking differently: Take 1,000 mg by mouth 3 (three) times daily. ), Disp: 30 tablet, Rfl: 0 .  aspirin EC 325 MG tablet, Take 1 tablet (325 mg total) by mouth daily. For 30 days post op for DVT Prophylaxis (Patient not taking: Reported on 03/14/2018), Disp: 30 tablet, Rfl: 0 .  aspirin EC 81 MG tablet, Take 81 mg by mouth daily., Disp: , Rfl:  .  atorvastatin (LIPITOR) 20 MG tablet, Take 20 mg by mouth daily., Disp: , Rfl:  .  DULoxetine (CYMBALTA) 60 MG capsule, Take 60  mg by mouth 2 (two) times daily., Disp: , Rfl:  .  ergocalciferol (VITAMIN D2) 50000 units capsule, Take 50,000 Units by mouth every 30 (thirty) days., Disp: , Rfl:  .  fexofenadine (ALLEGRA) 180 MG tablet, Take 180 mg by mouth daily., Disp: , Rfl:  .  gabapentin (NEURONTIN) 600 MG tablet, Take 600-900 mg by mouth See admin instructions. Take 1 tablet by mouth in the morning and midday, and 1 & 1/2 tablet every night, Disp: , Rfl:  .  lactase (LACTAID) 3000 units tablet, Take 9,000 Units by mouth 3 (three) times daily as needed (dairy)., Disp: , Rfl:  .  levothyroxine (SYNTHROID, LEVOTHROID) 75 MCG tablet, Take 75 mcg by mouth daily before breakfast., Disp: , Rfl:  .  Lidocaine HCl (ASPERCREME W/LIDOCAINE) 4 % CREA, Apply 1 application topically 3 (three) times daily as needed (painful areas)., Disp: , Rfl:  .  loperamide (IMODIUM) 2 MG capsule, Take 4 mg by mouth as needed for diarrhea or loose stools. , Disp: , Rfl:  .  LORazepam (ATIVAN) 1 MG tablet, Take 1 mg by mouth at bedtime. For severe periodic limb movement disorder, Disp: , Rfl:  .  losartan (COZAAR) 50 MG tablet, Take 50 mg by mouth daily., Disp: , Rfl:  .  metroNIDAZOLE (METROGEL) 0.75 % gel,  Apply 1 application topically daily as needed (rash). , Disp: , Rfl:  .  omeprazole (PRILOSEC) 20 MG capsule, Take 1 capsule (20 mg total) by mouth daily. 30 days for gastroprotection while taking Aspirin. (Patient not taking: Reported on 03/14/2018), Disp: 30 capsule, Rfl: 0 .  ondansetron (ZOFRAN ODT) 4 MG disintegrating tablet, Take 1 tablet (4 mg total) by mouth every 8 (eight) hours as needed for nausea or vomiting. (Patient not taking: Reported on 02/11/2017), Disp: 20 tablet, Rfl: 0 .  oxyCODONE (OXY IR/ROXICODONE) 5 MG immediate release tablet, Take 5-10 mg by mouth every 6 (six) hours as needed for breakthrough pain., Disp: , Rfl:  .  oxyCODONE (OXYCONTIN) 15 mg 12 hr tablet, Take 15 mg by mouth every 12 (twelve) hours., Disp: , Rfl:  .   oxyCODONE-acetaminophen (ROXICET) 5-325 MG tablet, Take 1-2 tablets by mouth every 4 (four) hours as needed for severe pain. (Patient not taking: Reported on 03/14/2018), Disp: 60 tablet, Rfl: 0 .  phenazopyridine (PYRIDIUM) 200 MG tablet, Take 200 mg by mouth 4 (four) times daily as needed (urinary burning)., Disp: , Rfl:  .  rotigotine (NEUPRO) 4 MG/24HR, Place 1 patch onto the skin daily., Disp: , Rfl:  .  tamsulosin (FLOMAX) 0.4 MG CAPS capsule, Take 0.4 mg by mouth at bedtime., Disp: , Rfl:  .  torsemide (DEMADEX) 20 MG tablet, Take 40 mg by mouth daily., Disp: , Rfl:  .  vitamin B-12 (CYANOCOBALAMIN) 1000 MCG tablet, Take 1,000 mcg by mouth daily., Disp: , Rfl:   Allergies  Allergen Reactions  . Lactose Intolerance (Gi) Other (See Comments)    Gi upset  . No Known Allergies    Review of Systems: Negative except as noted in the HPI. Denies N/V/F/Ch. Objective:  There were no vitals filed for this visit. General AA&O x3. Normal mood and affect.  Vascular Dorsalis pedis and posterior tibial pulses  present 2+ bilaterally  Capillary refill normal to all digits. Pedal hair growth normal.  Neurologic Epicritic sensation grossly present.  Dermatologic No open lesions. Interspaces clear of maceration. Nails well groomed and normal in appearance.  Orthopedic: MMT 5/5 in dorsiflexion, plantarflexion, inversion, and eversion. Normal joint ROM without pain or crepitus.   Assessment & Plan:  Patient was evaluated and treated and all questions answered.  Capsulitis, HPK Submet 3 left -Palliatively pared. -Discussed likely biomechanical etiology. -Should pain persist and he would like to further discuss surgery would need XR  Procedure: Paring of Lesion Rationale: painful hyperkeratotic lesion Type of Debridement: manual, sharp debridement. Instrumentation: 312 blade Number of Lesions: 1   No follow-ups on file.

## 2018-08-16 ENCOUNTER — Other Ambulatory Visit (HOSPITAL_COMMUNITY): Payer: Self-pay | Admitting: Interventional Radiology

## 2018-08-17 ENCOUNTER — Other Ambulatory Visit: Payer: Self-pay | Admitting: Radiology

## 2018-08-17 ENCOUNTER — Other Ambulatory Visit (HOSPITAL_COMMUNITY): Payer: Self-pay | Admitting: Interventional Radiology

## 2018-08-17 DIAGNOSIS — M8448XA Pathological fracture, other site, initial encounter for fracture: Secondary | ICD-10-CM

## 2018-08-18 ENCOUNTER — Other Ambulatory Visit: Payer: Self-pay

## 2018-08-18 ENCOUNTER — Ambulatory Visit (HOSPITAL_COMMUNITY)
Admission: RE | Admit: 2018-08-18 | Discharge: 2018-08-18 | Disposition: A | Discharge status: Home / Self Care | Payer: Medicare (Managed Care) | Source: Ambulatory Visit | Attending: Interventional Radiology | Admitting: Interventional Radiology

## 2018-08-18 ENCOUNTER — Encounter (HOSPITAL_COMMUNITY): Payer: Self-pay

## 2018-08-18 DIAGNOSIS — M8448XA Pathological fracture, other site, initial encounter for fracture: Secondary | ICD-10-CM

## 2018-08-18 DIAGNOSIS — E7800 Pure hypercholesterolemia, unspecified: Secondary | ICD-10-CM | POA: Diagnosis not present

## 2018-08-18 DIAGNOSIS — G4733 Obstructive sleep apnea (adult) (pediatric): Secondary | ICD-10-CM | POA: Insufficient documentation

## 2018-08-18 DIAGNOSIS — I10 Essential (primary) hypertension: Secondary | ICD-10-CM | POA: Insufficient documentation

## 2018-08-18 DIAGNOSIS — Z8249 Family history of ischemic heart disease and other diseases of the circulatory system: Secondary | ICD-10-CM | POA: Diagnosis not present

## 2018-08-18 DIAGNOSIS — X58XXXA Exposure to other specified factors, initial encounter: Secondary | ICD-10-CM | POA: Diagnosis not present

## 2018-08-18 DIAGNOSIS — M1990 Unspecified osteoarthritis, unspecified site: Secondary | ICD-10-CM | POA: Insufficient documentation

## 2018-08-18 DIAGNOSIS — Z79899 Other long term (current) drug therapy: Secondary | ICD-10-CM | POA: Diagnosis not present

## 2018-08-18 DIAGNOSIS — E039 Hypothyroidism, unspecified: Secondary | ICD-10-CM | POA: Diagnosis not present

## 2018-08-18 DIAGNOSIS — Z7982 Long term (current) use of aspirin: Secondary | ICD-10-CM | POA: Insufficient documentation

## 2018-08-18 DIAGNOSIS — G20 Parkinson's disease: Secondary | ICD-10-CM | POA: Insufficient documentation

## 2018-08-18 DIAGNOSIS — Z79890 Hormone replacement therapy (postmenopausal): Secondary | ICD-10-CM | POA: Diagnosis not present

## 2018-08-18 DIAGNOSIS — I739 Peripheral vascular disease, unspecified: Secondary | ICD-10-CM | POA: Insufficient documentation

## 2018-08-18 HISTORY — PX: IR SACROPLASTY BILATERAL: IMG5561

## 2018-08-18 LAB — BASIC METABOLIC PANEL
Anion gap: 9 (ref 5–15)
BUN: 13 mg/dL (ref 8–23)
CO2: 25 mmol/L (ref 22–32)
Calcium: 9.1 mg/dL (ref 8.9–10.3)
Chloride: 108 mmol/L (ref 98–111)
Creatinine, Ser: 0.96 mg/dL (ref 0.61–1.24)
GFR calc Af Amer: >60 mL/min (ref >60)
GFR calc non Af Amer: >60 mL/min (ref >60)
Glucose, Bld: 102 mg/dL — ABNORMAL HIGH (ref 70–99)
Potassium: 4.1 mmol/L (ref 3.5–5.1)
Sodium: 142 mmol/L (ref 135–145)

## 2018-08-18 LAB — CBC WITH DIFFERENTIAL/PLATELET
Abs Immature Granulocytes: 0.06 10*3/uL (ref 0.00–0.07)
Basophils Absolute: 0 10*3/uL (ref 0.0–0.1)
Basophils Relative: 0 %
Eosinophils Absolute: 0.2 10*3/uL (ref 0.0–0.5)
Eosinophils Relative: 2 %
HCT: 37.2 % — ABNORMAL LOW (ref 39.0–52.0)
Hemoglobin: 12.5 g/dL — ABNORMAL LOW (ref 13.0–17.0)
Immature Granulocytes: 1 %
Lymphocytes Relative: 26 %
Lymphs Abs: 2.5 10*3/uL (ref 0.7–4.0)
MCH: 33.7 pg (ref 26.0–34.0)
MCHC: 33.6 g/dL (ref 30.0–36.0)
MCV: 100.3 fL — ABNORMAL HIGH (ref 80.0–100.0)
Monocytes Absolute: 1 10*3/uL (ref 0.1–1.0)
Monocytes Relative: 10 %
Neutro Abs: 5.9 10*3/uL (ref 1.7–7.7)
Neutrophils Relative %: 61 %
Platelets: 261 10*3/uL (ref 150–400)
RBC: 3.71 MIL/uL — ABNORMAL LOW (ref 4.22–5.81)
RDW: 13.6 % (ref 11.5–15.5)
WBC: 9.6 10*3/uL (ref 4.0–10.5)
nRBC: 0 % (ref 0.0–0.2)

## 2018-08-18 LAB — PROTIME-INR
INR: 1.01
Prothrombin Time: 13.3 seconds (ref 11.4–15.2)

## 2018-08-18 LAB — URINALYSIS, ROUTINE W REFLEX MICROSCOPIC
Bilirubin Urine: NEGATIVE
Glucose, UA: NEGATIVE mg/dL
Hgb urine dipstick: NEGATIVE
Ketones, ur: NEGATIVE mg/dL
Leukocytes, UA: NEGATIVE
Nitrite: NEGATIVE
Protein, ur: NEGATIVE mg/dL
Specific Gravity, Urine: 1.01 (ref 1.005–1.030)
pH: 6.5 (ref 5.0–8.0)

## 2018-08-18 MED ORDER — MIDAZOLAM HCL 2 MG/2ML IJ SOLN
INTRAMUSCULAR | Status: AC
  Filled 2018-08-18: qty 2

## 2018-08-18 MED ORDER — FENTANYL CITRATE (PF) 100 MCG/2ML IJ SOLN
INTRAMUSCULAR | Status: AC
  Filled 2018-08-18: qty 2

## 2018-08-18 MED ORDER — BUPIVACAINE HCL (PF) 0.5 % IJ SOLN
INTRAMUSCULAR | Status: AC | PRN
  Administered 2018-08-18: 15 mL

## 2018-08-18 MED ORDER — GELATIN ABSORBABLE 12-7 MM EX MISC
CUTANEOUS | Status: AC
  Filled 2018-08-18: qty 1

## 2018-08-18 MED ORDER — TOBRAMYCIN SULFATE 1.2 G IJ SOLR
INTRAMUSCULAR | Status: AC
  Filled 2018-08-18: qty 1.2

## 2018-08-18 MED ORDER — SODIUM CHLORIDE 0.9 % IV SOLN
INTRAVENOUS | Status: DC
  Administered 2018-08-18: 10:00:00 via INTRAVENOUS

## 2018-08-18 MED ORDER — MIDAZOLAM HCL 2 MG/2ML IJ SOLN
INTRAMUSCULAR | Status: AC | PRN
  Administered 2018-08-18 (×2): 1 mg via INTRAVENOUS

## 2018-08-18 MED ORDER — HYDROMORPHONE HCL 1 MG/ML IJ SOLN
INTRAMUSCULAR | Status: AC
  Filled 2018-08-18: qty 1

## 2018-08-18 MED ORDER — CEFAZOLIN SODIUM-DEXTROSE 2-4 GM/100ML-% IV SOLN
2.0000 g | INTRAVENOUS | Status: AC
  Administered 2018-08-18: 2 g via INTRAVENOUS

## 2018-08-18 MED ORDER — SODIUM CHLORIDE 0.9 % IV SOLN: INTRAVENOUS | Status: AC

## 2018-08-18 MED ORDER — CEFAZOLIN SODIUM-DEXTROSE 2-4 GM/100ML-% IV SOLN
INTRAVENOUS | Status: AC
  Filled 2018-08-18: qty 100

## 2018-08-18 MED ORDER — IOPAMIDOL (ISOVUE-300) INJECTION 61%
INTRAVENOUS | Status: AC
  Administered 2018-08-18: 5 mL
  Filled 2018-08-18: qty 50

## 2018-08-18 MED ORDER — BUPIVACAINE HCL (PF) 0.5 % IJ SOLN
INTRAMUSCULAR | Status: AC
  Filled 2018-08-18: qty 30

## 2018-08-18 MED ORDER — FENTANYL CITRATE (PF) 100 MCG/2ML IJ SOLN
INTRAMUSCULAR | Status: AC | PRN
  Administered 2018-08-18 (×2): 25 ug via INTRAVENOUS

## 2018-08-18 NOTE — Discharge Instructions (Signed)
1. No stooping,bending or lifting more than  10 lbs for 2 weeks. 2.Use Antwane Grose to ambulate for 2 weeks. 3.No driving for 2 weeks. 4. RTC PRN  KYPHOPLASTY/VERTEBROPLASTY DISCHARGE INSTRUCTIONS  Medications: (check all that apply)     Resume all home medications as before procedure.       Resume your aspirin on Sat 08/19/18             Continue your pain medications as prescribed as needed.  Over the next 3-5 days, decrease your pain medication as tolerated.  Over the counter medications (i.e. Tylenol, ibuprofen, and aleve) may be substituted once severe/moderate pain symptoms have subsided.   Wound Care: - Bandages may be removed the day following your procedure.  You may get your incision wet once bandages are removed.  Bandaids may be used to cover the incisions until scab formation.  Topical ointments are optional.  - If you develop a fever greater than 101 degrees, have increased skin redness at the incision sites or pus-like oozing from incisions occurring within 1 week of the procedure, contact radiology at [phone] or [phone].  - Ice pack to back for 15-20 minutes 2-3 time per day for first 2-3 days post procedure.  The ice will expedite muscle healing and help with the pain from the incisions.   Activity: - Bedrest today with limited activity for 24 hours post procedure.  - No driving for 2 weeks.  - Increase your activity as tolerated after bedrest (with assistance if necessary).  - Refrain from any strenuous activity or heavy lifting (greater than 10 lbs.).   Follow up: - Contact radiology at [phone] or [phone] if any questions/concerns.  - A physician assistant from radiology will contact you in approximately 1 week.  - If a biopsy was performed at the time of your procedure, your referring physician should receive the results in usually 2-3 days.

## 2018-08-18 NOTE — Procedures (Signed)
S/P   Bilateral Sacral vertebroplasty

## 2018-08-18 NOTE — H&P (Signed)
Chief Complaint: Patient was seen in consultation today for Bilat sacroplasty  at the request of Dr Charissa BashJulie Williams Dr Chrisandra NettersB Jackson   Supervising Physician: Julieanne Cottoneveshwar, Sanjeev  [facility]  History of Present Illness: Kenneth Brandt is a 83 y.o. male   Known to NIR L1 KP 02/2018 Has done well post procedure with great result Developed low back- "seat" pain last few weeks  MRI 07/17/18 IMPRESSION: 1. Acute bilateral sacral fractures. Suspected epidural hematoma in the upper sacral spinal canal extending to L5 and effacing the thecal sac. 2. Interval augmentation of L1 compression fracture with decreased edema. 3. Slightly decreased size of L3-4 disc extrusion with slightly improved right lateral recess patency. 4. Unchanged disc and facet degeneration elsewhere as above.  Imaging reviewed with Dr Pasty Spillerseveshwar Approves B Sacroplasty  Pt does have wife at home-  [facility] This is a [employer] Transport through MD per pt  Past Medical History:  Diagnosis Date  . Anxiety   . Arthritis   . Dementia (HCC)   . Depression   . Dyspnea    occasionally  . Hepatitis    A  . Hypercholesteremia   . Hypertension   . Hypothyroid   . OSA (obstructive sleep apnea)    questionable  . PAD (peripheral artery disease) (HCC)   . Parkinson disease [facility](HCC)     Past Surgical History:  Procedure Laterality Date  . ANKLE FRACTURE SURGERY Right   . APPENDECTOMY     remote  . BACK SURGERY  2009, 2011   laminectomies  . CARDIOVASCULAR STRESS TEST     04/25/15 Nuclear stress test [facility] No definite evidence for inducible ischemia or infarct, EF 74%  . CATARACT EXTRACTION Bilateral   . IR KYPHO LUMBAR INC FX REDUCE BONE BX UNI/BIL CANNULATION INC/IMAGING  03/14/2018  . REPLACEMENT TOTAL KNEE Right 2012  . RETINAL DETACHMENT SURGERY Right   . TOTAL KNEE ARTHROPLASTY Left 02/22/2017  . TOTAL KNEE ARTHROPLASTY Left  02/22/2017   Procedure: LEFT TOTAL KNEE ARTHROPLASTY;  Surgeon: Sheral ApleyMurphy, Timothy D, MD;  [facility];  Service: Orthopedics;  Laterality: Left;    Allergies: Lactose intolerance (gi) and No known allergies  Medications: Prior to Admission medications   Medication Sig Start Date End Date Taking? Authorizing Provider  acetaminophen (TYLENOL) 500 MG tablet Take 1 tablet (500 mg total) by mouth every 8 (eight) hours as needed (pain). Patient taking differently: Take 1,000 mg by mouth 3 (three) times daily.  02/11/16  Yes Arrien, York RamMauricio Daniel, MD  aspirin EC 81 MG tablet Take 81 mg by mouth daily.   Yes [provider]  atorvastatin (LIPITOR) 20 MG tablet Take 20 mg by mouth daily.   Yes [provider]  DULoxetine (CYMBALTA) 60 MG capsule Take 60 mg by mouth 2 (two) times daily.   Yes [provider]  ergocalciferol (VITAMIN D2) 50000 units capsule Take 50,000 Units by mouth every 30 (thirty) days.   Yes [provider]  fexofenadine (ALLEGRA) 180 MG tablet Take 180 mg by mouth daily.   Yes [provider]  gabapentin (NEURONTIN) 600 MG tablet Take 600-900 mg by mouth See admin instructions. Take 1 tablet by mouth in the morning and midday, and 1 & 1/2 tablet every night   Yes [provider]  lactase (LACTAID) 3000 units tablet Take 9,000 Units by mouth 3 (three) times daily as needed (dairy).   Yes [provider]  levothyroxine (SYNTHROID, LEVOTHROID) 75 MCG tablet Take 75 mcg by mouth daily before breakfast.   Yes [provider]  Lidocaine HCl (ASPERCREME W/LIDOCAINE) 4 % CREA Apply 1 application topically 3 (three) times daily as needed (painful areas).   Yes [provider]  LORazepam (ATIVAN) 1 MG tablet Take 1 mg by mouth at bedtime. For severe periodic limb movement disorder   Yes [provider]  losartan (COZAAR) 50 MG tablet Take 50 mg by mouth daily.   Yes [provider]  vitamin  B-12 (CYANOCOBALAMIN) 1000 MCG tablet Take 1,000 mcg by mouth daily.   Yes [provider]  aspirin EC 325 MG tablet Take 1 tablet (325 mg total) by mouth daily. For 30 days post op for DVT Prophylaxis Patient not taking: Reported on 03/14/2018 02/22/17   Albina Billet III, PA-C  loperamide (IMODIUM) 2 MG capsule Take 4 mg by mouth as needed for diarrhea or loose stools.     [provider]  metroNIDAZOLE (METROGEL) 0.75 % gel Apply 1 application topically daily as needed (rash).     [provider]  omeprazole (PRILOSEC) 20 MG capsule Take 1 capsule (20 mg total) by mouth daily. 30 days for gastroprotection while taking Aspirin. Patient not taking: Reported on 03/14/2018 02/22/17   Albina Billet III, PA-C  ondansetron (ZOFRAN ODT) 4 MG disintegrating tablet Take 1 tablet (4 mg total) by mouth every 8 (eight) hours as needed for nausea or vomiting. Patient not taking: Reported on 02/11/2017 01/15/17   Horton, Mayer Masker, MD  oxyCODONE (OXY IR/ROXICODONE) 5 MG immediate release tablet Take 5-10 mg by mouth every 6 (six) hours as needed for breakthrough pain.    [provider]  oxyCODONE (OXYCONTIN) 15 mg 12 hr tablet Take 15 mg by mouth every 12 (twelve) hours.    [provider]  oxyCODONE-acetaminophen (ROXICET) 5-325 MG tablet Take 1-2 tablets by mouth every 4 (four) hours as needed for severe pain. Patient not taking: Reported on 03/14/2018 02/22/17   Albina Billet III, PA-C  phenazopyridine (PYRIDIUM) 200 MG tablet Take 200 mg by mouth 4 (four) times daily as needed (urinary burning).    [provider]  rotigotine (NEUPRO) 4 MG/24HR Place 1 patch onto the skin daily.    [provider]  tamsulosin (FLOMAX) 0.4 MG CAPS capsule Take 0.4 mg by mouth at bedtime.    [provider]  torsemide (DEMADEX) 20 MG tablet Take 40 mg by mouth daily.    [provider]     Family History  Problem  Relation Age of Onset  . Cancer Mother   . Pneumonia Mother   . CAD Father   . CAD Brother   . Cancer Other   . Stroke Neg Hx     Social History   Socioeconomic History  . Marital status: Married    Spouse name: Beecher Mcardle  . Number of children: 3  . Years of education: college  . Highest education level: Not on file  Occupational History    Comment: former Education officer, environmental, missionary  Social Needs  . Financial resource strain: Not on file  . Food insecurity:    Worry: Not on file    Inability: Not on file  . Transportation needs:    Medical: Not on file    Non-medical: Not on file  Tobacco Use  . Smoking status: Never Smoker  . Smokeless tobacco: Never Used  Substance and Sexual Activity  . Alcohol use: No  .  Drug use: No  . Sexual activity: Not on file  Lifestyle  . Physical activity:    Days per week: Not on file    Minutes per session: Not on file  . Stress: Not on file  Relationships  . Social connections:    Talks on phone: Not on file    Gets together: Not on file    Attends religious service: Not on file    Active member of club or organization: Not on file    Attends meetings of clubs or organizations: Not on file    Relationship status: Not on file  Other Topics Concern  . Not on file  Social History Narrative   Lives at home with wife, caregiver   Caffeine- coffee, 1 cup daily, coke occasionally    Review of Systems: A 12 point ROS discussed and pertinent positives are indicated in the HPI above.  All other systems are negative.  Review of Systems  Constitutional: Positive for activity change. Negative for appetite change and fever.  Respiratory: Negative for shortness of breath.   Gastrointestinal: Negative for abdominal pain.  Musculoskeletal: Positive for back pain and gait problem.  Neurological: Negative for weakness.  Psychiatric/Behavioral: Negative for behavioral problems and confusion.    Vital Signs: Pulse 86   Temp 98.4 F (36.9 C)   Ht 5'  7" (1.702 m)   Wt 240 lb (108.9 kg)   SpO2 94%   BMI 37.59 kg/m   Physical Exam Vitals signs reviewed.  Cardiovascular:     Rate and Rhythm: Normal rate and regular rhythm.  Pulmonary:     Effort: Pulmonary effort is normal.     Breath sounds: Normal breath sounds.  Abdominal:     General: Bowel sounds are normal.     Palpations: Abdomen is soft.  Musculoskeletal: Normal range of motion.     Comments: Low back pain  Skin:    General: Skin is warm and dry.  Neurological:     General: No focal deficit present.     Mental Status: He is alert and oriented to person, place, and time.  Psychiatric:        Mood and Affect: Mood normal.        Behavior: Behavior normal.        Thought Content: Thought content normal.        Judgment: Judgment normal.     Imaging: No results found.  Labs:  CBC: Recent Labs    03/14/18 1040 08/18/18 1000  WBC 8.1 9.6  HGB 13.1 12.5*  HCT 40.2 37.2*  PLT 242 261    COAGS: Recent Labs    03/14/18 1133 08/18/18 1000  INR 1.11 1.01  APTT 30  --     BMP: Recent Labs    03/14/18 1133 08/18/18 1000  NA 142 142  K 3.6 4.1  CL 104 108  CO2 29 25  GLUCOSE 103* 102*  BUN 25* 13  CALCIUM 9.3 9.1  CREATININE 1.22 0.96  GFRNONAA 54* >60  GFRAA >60 >60    LIVER FUNCTION TESTS: No results for input(s): BILITOT, AST, ALT, ALKPHOS, PROT, ALBUMIN in the last 8760 hours.  TUMOR MARKERS: No results for input(s): AFPTM, CEA, CA199, CHROMGRNA in the last 8760 hours.  Assessment and Plan:  Pain in seat for weeks MRI reveals B sacral fx Scheduled now for Bila Sacroplasty Risks and benefits of Bilat sacroplasty were discussed with the patient including, but not limited to education regarding the natural healing process  of compression fractures without intervention, bleeding, infection, cement migration which may cause spinal cord damage, paralysis, pulmonary embolism or even death.  This interventional procedure involves the use of  X-rays and because of the nature of the planned procedure, it is possible that we will have prolonged use of X-ray fluoroscopy.  Potential radiation risks to you include (but are not limited to) the following: - A slightly elevated risk for cancer  several years later in life. This risk is typically less than 0.5% percent. This risk is low in comparison to the normal incidence of human cancer, which is 33% for women and 50% for men according to the [employer] Society. - Radiation induced injury can include skin redness, resembling a rash, tissue breakdown / ulcers and hair loss (which can be temporary or permanent).   The likelihood of either of these occurring depends on the difficulty of the procedure and whether you are sensitive to radiation due to previous procedures, disease, or genetic conditions.   IF your procedure requires a prolonged use of radiation, you will be notified and given written instructions for further action.  It is your responsibility to monitor the irradiated area for the 2 weeks following the procedure and to notify your physician if you are concerned that you have suffered a radiation induced injury.    All of the patient's questions were answered, patient is agreeable to proceed.  Consent signed and in chart.  Thank you for this interesting consult.  I greatly enjoyed meeting Kenneth Brandt and look forward to participating in their care.  A copy of this report was sent to the requesting provider on this date.  Electronically Signed: Robet LeuPamela A Jodette Wik, PA-C 08/18/2018, 10:51 AM   I spent a total of  30 Minutes   in face to face in clinical consultation, greater than 50% of which was counseling/coordinating care for B SP

## 2018-08-19 ENCOUNTER — Encounter (HOSPITAL_COMMUNITY): Payer: Self-pay | Admitting: Interventional Radiology

## 2018-08-28 ENCOUNTER — Emergency Department (HOSPITAL_COMMUNITY): Payer: Medicare (Managed Care)

## 2018-08-28 ENCOUNTER — Encounter (HOSPITAL_COMMUNITY): Payer: Self-pay | Admitting: Emergency Medicine

## 2018-08-28 ENCOUNTER — Emergency Department (HOSPITAL_COMMUNITY)
Admission: EM | Admit: 2018-08-28 | Discharge: 2018-08-28 | Disposition: A | Discharge status: Home / Self Care | Payer: Medicare (Managed Care) | Attending: Emergency Medicine | Admitting: Emergency Medicine

## 2018-08-28 ENCOUNTER — Other Ambulatory Visit: Payer: Self-pay

## 2018-08-28 DIAGNOSIS — Z7982 Long term (current) use of aspirin: Secondary | ICD-10-CM | POA: Diagnosis not present

## 2018-08-28 DIAGNOSIS — R1011 Right upper quadrant pain: Secondary | ICD-10-CM | POA: Diagnosis present

## 2018-08-28 DIAGNOSIS — R300 Dysuria: Secondary | ICD-10-CM | POA: Diagnosis not present

## 2018-08-28 DIAGNOSIS — K5900 Constipation, unspecified: Secondary | ICD-10-CM | POA: Diagnosis not present

## 2018-08-28 DIAGNOSIS — G20 Parkinson's disease: Secondary | ICD-10-CM | POA: Insufficient documentation

## 2018-08-28 DIAGNOSIS — R110 Nausea: Secondary | ICD-10-CM | POA: Diagnosis not present

## 2018-08-28 DIAGNOSIS — I10 Essential (primary) hypertension: Secondary | ICD-10-CM | POA: Diagnosis not present

## 2018-08-28 DIAGNOSIS — E039 Hypothyroidism, unspecified: Secondary | ICD-10-CM | POA: Diagnosis not present

## 2018-08-28 DIAGNOSIS — R000 Tachycardia, unspecified: Secondary | ICD-10-CM | POA: Insufficient documentation

## 2018-08-28 DIAGNOSIS — Z79899 Other long term (current) drug therapy: Secondary | ICD-10-CM | POA: Diagnosis not present

## 2018-08-28 DIAGNOSIS — F0390 Unspecified dementia without behavioral disturbance: Secondary | ICD-10-CM | POA: Insufficient documentation

## 2018-08-28 DIAGNOSIS — R1010 Upper abdominal pain, unspecified: Secondary | ICD-10-CM

## 2018-08-28 LAB — URINALYSIS, ROUTINE W REFLEX MICROSCOPIC
Bilirubin Urine: NEGATIVE
Glucose, UA: NEGATIVE mg/dL
Hgb urine dipstick: NEGATIVE
Ketones, ur: NEGATIVE mg/dL
Leukocytes, UA: NEGATIVE
Nitrite: NEGATIVE
Protein, ur: NEGATIVE mg/dL
Specific Gravity, Urine: 1.031 — ABNORMAL HIGH (ref 1.005–1.030)
pH: 6 (ref 5.0–8.0)

## 2018-08-28 LAB — COMPREHENSIVE METABOLIC PANEL
ALT: 16 U/L (ref 0–44)
AST: 19 U/L (ref 15–41)
Albumin: 3.4 g/dL — ABNORMAL LOW (ref 3.5–5.0)
Alkaline Phosphatase: 158 U/L — ABNORMAL HIGH (ref 38–126)
Anion gap: 13 (ref 5–15)
BUN: 10 mg/dL (ref 8–23)
CO2: 22 mmol/L (ref 22–32)
Calcium: 9.3 mg/dL (ref 8.9–10.3)
Chloride: 103 mmol/L (ref 98–111)
Creatinine, Ser: 0.88 mg/dL (ref 0.61–1.24)
GFR calc Af Amer: >60 mL/min (ref >60)
GFR calc non Af Amer: >60 mL/min (ref >60)
Glucose, Bld: 112 mg/dL — ABNORMAL HIGH (ref 70–99)
Potassium: 4 mmol/L (ref 3.5–5.1)
Sodium: 138 mmol/L (ref 135–145)
Total Bilirubin: 0.7 mg/dL (ref 0.3–1.2)
Total Protein: 6.8 g/dL (ref 6.5–8.1)

## 2018-08-28 LAB — LIPASE, BLOOD: Lipase: 24 U/L (ref 11–51)

## 2018-08-28 LAB — CBC WITH DIFFERENTIAL/PLATELET
Abs Immature Granulocytes: 0.06 10*3/uL (ref 0.00–0.07)
Basophils Absolute: 0 10*3/uL (ref 0.0–0.1)
Basophils Relative: 0 %
Eosinophils Absolute: 0 10*3/uL (ref 0.0–0.5)
Eosinophils Relative: 0 %
HCT: 37.3 % — ABNORMAL LOW (ref 39.0–52.0)
Hemoglobin: 12.4 g/dL — ABNORMAL LOW (ref 13.0–17.0)
Immature Granulocytes: 1 %
Lymphocytes Relative: 20 %
Lymphs Abs: 2 10*3/uL (ref 0.7–4.0)
MCH: 33 pg (ref 26.0–34.0)
MCHC: 33.2 g/dL (ref 30.0–36.0)
MCV: 99.2 fL (ref 80.0–100.0)
Monocytes Absolute: 0.7 10*3/uL (ref 0.1–1.0)
Monocytes Relative: 7 %
Neutro Abs: 7.3 10*3/uL (ref 1.7–7.7)
Neutrophils Relative %: 72 %
Platelets: 278 10*3/uL (ref 150–400)
RBC: 3.76 MIL/uL — ABNORMAL LOW (ref 4.22–5.81)
RDW: 13.4 % (ref 11.5–15.5)
WBC: 10 10*3/uL (ref 4.0–10.5)
nRBC: 0 % (ref 0.0–0.2)

## 2018-08-28 LAB — I-STAT CG4 LACTIC ACID, ED
Lactic Acid, Venous: 2.41 mmol/L (ref 0.5–1.9)
Lactic Acid, Venous: 1.24 mmol/L (ref 0.5–1.9)

## 2018-08-28 MED ORDER — ONDANSETRON HCL 4 MG/2ML IJ SOLN
4.0000 mg | Freq: Once | INTRAMUSCULAR | Status: AC
  Administered 2018-08-28: 4 mg via INTRAVENOUS
  Filled 2018-08-28: qty 2

## 2018-08-28 MED ORDER — FAMOTIDINE IN NACL 20-0.9 MG/50ML-% IV SOLN
20.0000 mg | Freq: Once | INTRAVENOUS | Status: AC
  Administered 2018-08-28: 20 mg via INTRAVENOUS
  Filled 2018-08-28: qty 50

## 2018-08-28 MED ORDER — MORPHINE SULFATE (PF) 4 MG/ML IV SOLN
4.0000 mg | Freq: Once | INTRAVENOUS | Status: AC
  Administered 2018-08-28: 4 mg via INTRAVENOUS
  Filled 2018-08-28: qty 1

## 2018-08-28 MED ORDER — IOHEXOL 300 MG/ML  SOLN
100.0000 mL | Freq: Once | INTRAMUSCULAR | Status: AC | PRN
  Administered 2018-08-28: 75 mL via INTRAVENOUS

## 2018-08-28 NOTE — ED Notes (Signed)
IV team unable to gain IV access. IV team nurse stated she was going to call the PICC nurse

## 2018-08-28 NOTE — ED Notes (Signed)
Daughter, Sheela Stackkarisa fandiford, would like an update as soon as possible at [phone]

## 2018-08-28 NOTE — ED Notes (Signed)
Patient transported to Ultrasound 

## 2018-08-28 NOTE — ED Triage Notes (Signed)
[facility]. Pt was sent here by [facility] MD for evaluation of possible bowel obstruction. Pt reports severe abdominal pain since Friday. Abdomen is distended. Pt states he had a bowel movement this morning. Pt had recent sacral surgery and was placed on opioids for pain about 2 weeks ago.   BP 134/70 HR 64 CBG 128 Temp 98.1

## 2018-08-28 NOTE — ED Provider Notes (Signed)
Signed out by Dr Rush Landmarkegeler that CT neg, and that pt is waiting for u/s. If u/s neg for acute process to d/c to home.   U/s resulted - neg for acute process.   Abd is soft and nontender. No vomiting. Pt denies cough or sob. Pt currently states feels improved, denies pain or nausea.  Pt currently appears stable for d/c.      Cathren LaineSteinl, Jynesis Nakamura, MD 08/28/18 [phone]

## 2018-08-28 NOTE — ED Notes (Signed)
Patient transported to X-ray 

## 2018-08-28 NOTE — ED Provider Notes (Signed)
[facility] EMERGENCY DEPARTMENT Provider Note   CSN: [id number] Arrival date & time: 08/28/18  1133     History   Chief Complaint No chief complaint on file.   HPI Kenneth Brandt is a 83 y.o. male.  The history is provided by the patient and medical records. No language interpreter was used.  Abdominal Pain   This is a new problem. The current episode started more than 2 days ago. The problem occurs constantly. The problem has not changed since onset.The pain is associated with an unknown factor. The pain is located in the generalized abdominal region. The quality of the pain is aching and sharp. The pain is at a severity of 10/10. The pain is severe. Associated symptoms include nausea, constipation and dysuria. Pertinent negatives include fever, diarrhea, vomiting, frequency and headaches. The symptoms are aggravated by palpation and eating. Nothing relieves the symptoms.    Past Medical History:  Diagnosis Date  . Anxiety   . Arthritis   . Dementia ([place])   . Depression   . Dyspnea    occasionally  . Hepatitis    A  . Hypercholesteremia   . Hypertension   . Hypothyroid   . OSA (obstructive sleep apnea)    questionable  . PAD (peripheral artery disease) ([place])   . Parkinson disease [facility])     Patient Active Problem List   Diagnosis Date Noted  . Primary osteoarthritis of left knee 02/22/2017  . Hyperlipidemia 02/01/2017  . Vitamin D deficiency 02/01/2017  . Peripheral artery disease ([place]) 02/01/2017  . Chronic diarrhea 02/01/2017  . Hypothyroidism 02/01/2017  . Chronic low back pain 02/01/2017  . Chronic pain 02/01/2017  . Facial cellulitis 02/08/2016  . Dental abscess 02/08/2016  . Dementia with behavioral disturbance ([place]) 02/08/2016  . Parkinson disease ([place]) 02/08/2016  . Essential hypertension 02/08/2016    Past Surgical History:  Procedure Laterality Date  . ANKLE FRACTURE SURGERY Right   . APPENDECTOMY     remote  . BACK  SURGERY  2009, 2011   laminectomies  . CARDIOVASCULAR STRESS TEST     04/25/15 Nuclear stress test [facility]): No definite evidence for inducible ischemia or infarct, EF 74%  . CATARACT EXTRACTION Bilateral   . IR KYPHO LUMBAR INC FX REDUCE BONE BX UNI/BIL CANNULATION INC/IMAGING  03/14/2018  . IR SACROPLASTY BILATERAL  08/18/2018  . REPLACEMENT TOTAL KNEE Right 2012  . RETINAL DETACHMENT SURGERY Right   . TOTAL KNEE ARTHROPLASTY Left 02/22/2017  . TOTAL KNEE ARTHROPLASTY Left 02/22/2017   Procedure: LEFT TOTAL KNEE ARTHROPLASTY;  Surgeon: Renette Butters, MD;  Location: [place];  Service: Orthopedics;  Laterality: Left;        Home Medications    Prior to Admission medications   Medication Sig Start Date End Date Taking? Authorizing Provider  acetaminophen (TYLENOL) 500 MG tablet Take 1 tablet (500 mg total) by mouth every 8 (eight) hours as needed (pain). Patient taking differently: Take 1,000 mg by mouth 3 (three) times daily.  02/11/16   Arrien, Jimmy Picket, MD  aspirin EC 325 MG tablet Take 1 tablet (325 mg total) by mouth daily. For 30 days post op for DVT Prophylaxis Patient not taking: Reported on 03/14/2018 02/22/17   Prudencio Burly III, PA-C  aspirin EC 81 MG tablet Take 81 mg by mouth daily.    [provider]  atorvastatin (LIPITOR) 20 MG tablet Take 20 mg by mouth daily.    [provider]  DULoxetine (  CYMBALTA) 60 MG capsule Take 60 mg by mouth 2 (two) times daily.    [provider]  ergocalciferol (VITAMIN D2) 50000 units capsule Take 50,000 Units by mouth every 30 (thirty) days.    [provider]  fexofenadine (ALLEGRA) 180 MG tablet Take 180 mg by mouth daily.    [provider]  gabapentin (NEURONTIN) 600 MG tablet Take 600-900 mg by mouth See admin instructions. Take 1 tablet by mouth in the morning and midday, and 1 & 1/2 tablet every night    [provider]  lactase (LACTAID) 3000 units tablet  Take 9,000 Units by mouth 3 (three) times daily as needed (dairy).    [provider]  levothyroxine (SYNTHROID, LEVOTHROID) 75 MCG tablet Take 75 mcg by mouth daily before breakfast.    [provider]  Lidocaine HCl (ASPERCREME W/LIDOCAINE) 4 % CREA Apply 1 application topically 3 (three) times daily as needed (painful areas).    [provider]  loperamide (IMODIUM) 2 MG capsule Take 4 mg by mouth as needed for diarrhea or loose stools.     [provider]  LORazepam (ATIVAN) 1 MG tablet Take 1 mg by mouth at bedtime. For severe periodic limb movement disorder    [provider]  losartan (COZAAR) 50 MG tablet Take 50 mg by mouth daily.    [provider]  metroNIDAZOLE (METROGEL) 0.75 % gel Apply 1 application topically daily as needed (rash).     [provider]  omeprazole (PRILOSEC) 20 MG capsule Take 1 capsule (20 mg total) by mouth daily. 30 days for gastroprotection while taking Aspirin. Patient not taking: Reported on 03/14/2018 02/22/17   Prudencio Burly III, PA-C  ondansetron (ZOFRAN ODT) 4 MG disintegrating tablet Take 1 tablet (4 mg total) by mouth every 8 (eight) hours as needed for nausea or vomiting. Patient not taking: Reported on 02/11/2017 01/15/17   Horton, Barbette Hair, MD  oxyCODONE (OXY IR/ROXICODONE) 5 MG immediate release tablet Take 5-10 mg by mouth every 6 (six) hours as needed for breakthrough pain.    [provider]  oxyCODONE (OXYCONTIN) 15 mg 12 hr tablet Take 15 mg by mouth every 12 (twelve) hours.    [provider]  oxyCODONE-acetaminophen (ROXICET) 5-325 MG tablet Take 1-2 tablets by mouth every 4 (four) hours as needed for severe pain. Patient not taking: Reported on 03/14/2018 02/22/17   Prudencio Burly III, PA-C  phenazopyridine (PYRIDIUM) 200 MG tablet Take 200 mg by mouth 4 (four) times daily as needed (urinary burning).    [provider]  rotigotine (NEUPRO) 4  MG/24HR Place 1 patch onto the skin daily.    [provider]  tamsulosin (FLOMAX) 0.4 MG CAPS capsule Take 0.4 mg by mouth at bedtime.    [provider]  torsemide (DEMADEX) 20 MG tablet Take 40 mg by mouth daily.    [provider]  vitamin B-12 (CYANOCOBALAMIN) 1000 MCG tablet Take 1,000 mcg by mouth daily.    [provider]    Family History Family History  Problem Relation Age of Onset  . Cancer Mother   . Pneumonia Mother   . CAD Father   . CAD Brother   . Cancer Other   . Stroke Neg Hx     Social History Social History   Tobacco Use  . Smoking status: Never Smoker  . Smokeless tobacco: Never Used  Substance Use Topics  . Alcohol use: No  . Drug use: No  Allergies   Lactose intolerance (gi) and No known allergies   Review of Systems Review of Systems  Constitutional: Positive for chills and fatigue. Negative for diaphoresis and fever.  HENT: Negative for congestion.   Eyes: Negative for visual disturbance.  Respiratory: Positive for cough. Negative for chest tightness, shortness of breath and wheezing.   Cardiovascular: Negative for chest pain and leg swelling.  Gastrointestinal: Positive for abdominal distention, abdominal pain, constipation and nausea. Negative for blood in stool, diarrhea and vomiting.  Genitourinary: Positive for dysuria. Negative for flank pain and frequency.  Musculoskeletal: Negative for back pain, neck pain and neck stiffness.  Skin: Negative for rash and wound.  Neurological: Negative for light-headedness, numbness and headaches.  Psychiatric/Behavioral: Negative for agitation.  All other systems reviewed and are negative.    Physical Exam Updated Vital Signs Temp 100.1 F (37.8 C) (Rectal)   Ht 5' 7"  (1.702 m)   Wt 108.9 kg   BMI 37.59 kg/m   Physical Exam Vitals signs and nursing note reviewed.  Constitutional:      General: He is not in acute distress.    Appearance: He is  well-developed. He is not ill-appearing, toxic-appearing or diaphoretic.  HENT:     Head: Normocephalic and atraumatic.     Nose: No congestion or rhinorrhea.     Mouth/Throat:     Pharynx: No oropharyngeal exudate or posterior oropharyngeal erythema.  Eyes:     Conjunctiva/sclera: Conjunctivae normal.     Pupils: Pupils are equal, round, and reactive to light.  Neck:     Musculoskeletal: Neck supple. No neck rigidity.  Cardiovascular:     Rate and Rhythm: Regular rhythm. Tachycardia present.     Pulses: Normal pulses.     Heart sounds: No murmur.  Pulmonary:     Effort: Pulmonary effort is normal. No respiratory distress.     Breath sounds: Normal breath sounds. No wheezing, rhonchi or rales.  Chest:     Chest wall: No tenderness.  Abdominal:     General: There is distension.     Palpations: Abdomen is soft.     Tenderness: There is abdominal tenderness. There is no right CVA tenderness, left CVA tenderness, guarding or rebound.  Musculoskeletal:        General: No swelling or tenderness.     Right lower leg: No edema.     Left lower leg: No edema.  Skin:    General: Skin is warm and dry.  Neurological:     Mental Status: He is alert.  Psychiatric:        Mood and Affect: Mood normal.      ED Treatments / Results  Labs (all labs ordered are listed, but only abnormal results are displayed) Labs Reviewed  CBC WITH DIFFERENTIAL/PLATELET - Abnormal; Notable for the following components:      Result Value   RBC 3.76 (*)    Hemoglobin 12.4 (*)    HCT 37.3 (*)    All other components within normal limits  COMPREHENSIVE METABOLIC PANEL - Abnormal; Notable for the following components:   Glucose, Bld 112 (*)    Albumin 3.4 (*)    Alkaline Phosphatase 158 (*)    All other components within normal limits  URINALYSIS, ROUTINE W REFLEX MICROSCOPIC - Abnormal; Notable for the following components:   Specific Gravity, Urine 1.031 (*)    All other components within normal  limits  I-STAT CG4 LACTIC ACID, ED - Abnormal; Notable for the following components:  Lactic Acid, Venous 2.41 (*)    All other components within normal limits  CULTURE, BLOOD (ROUTINE X 2)  CULTURE, BLOOD (ROUTINE X 2)  URINE CULTURE  LIPASE, BLOOD  I-STAT CG4 LACTIC ACID, ED    EKG None  Radiology Dg Chest 2 View  Result Date: 08/28/2018 CLINICAL DATA:  Severe abdominal pain for the past 3 days. Also abdominal distension, chest discomfort, and shortness of breath. EXAM: CHEST - 2 VIEW COMPARISON:  None. FINDINGS: The lungs are adequately inflated. The interstitial markings are coarse bilaterally especially at the lung bases. The heart is top-normal in size. There is tortuosity of the ascending and descending thoracic aorta with mural calcification. The pulmonary vascularity is not engorged. There is no pleural effusion. There is mild multilevel degenerative disc disease of the thoracic spine. The observed portions of the upper abdomen are unremarkable. IMPRESSION: Bibasilar interstitial prominence may be acute or chronic. There is no alveolar pneumonia nor pulmonary edema. Thoracic aortic atherosclerosis. Electronically Signed   By: David  Martinique M.D.   On: 08/28/2018 15:16   Ct Abdomen Pelvis W Contrast  Result Date: 08/28/2018 CLINICAL DATA:  Acute generalized abdominal pain. EXAM: CT ABDOMEN AND PELVIS WITH CONTRAST TECHNIQUE: Multidetector CT imaging of the abdomen and pelvis was performed using the standard protocol following bolus administration of intravenous contrast. CONTRAST:  105m OMNIPAQUE IOHEXOL 300 MG/ML  SOLN COMPARISON:  CT scan of Jan 15, 2017. FINDINGS: Lower chest: No acute abnormality. Hepatobiliary: No focal liver abnormality is seen. No gallstones, gallbladder wall thickening, or biliary dilatation. Pancreas: Unremarkable. No pancreatic ductal dilatation or surrounding inflammatory changes. Spleen: Normal in size without focal abnormality. Adrenals/Urinary Tract:  Adrenal glands appear normal. Stable large exophytic right renal cyst. No hydronephrosis or renal obstruction is noted. No renal or ureteral calculi are noted. Urinary bladder is unremarkable. Stomach/Bowel: The stomach appears normal. There is no evidence of bowel obstruction or inflammation. The appendix is not visualized. Vascular/Lymphatic: Aortic atherosclerosis. No enlarged abdominal or pelvic lymph nodes. Reproductive: Prostate is unremarkable. Other: Small fat containing right inguinal hernia is noted. No ascites or fluid collection is noted. Small fat containing periumbilical hernia is noted. Musculoskeletal: Status post bilateral sacroplasty. Status post L1 kyphoplasty. No acute osseous abnormality is noted. IMPRESSION: Small fat containing right inguinal and periumbilical hernias. No acute abnormality seen in the abdomen or pelvis. Aortic Atherosclerosis (ICD10-I70.0). Electronically Signed   By: JMarijo Conception M.D.   On: 08/28/2018 15:42    Procedures Procedures (including critical care time)  Medications Ordered in ED Medications  morphine 4 MG/ML injection 4 mg (4 mg Intravenous Given 08/28/18 1310)  ondansetron (ZOFRAN) injection 4 mg (4 mg Intravenous Given 08/28/18 1310)  iohexol (OMNIPAQUE) 300 MG/ML solution 100 mL (75 mLs Intravenous Contrast Given 08/28/18 1517)  famotidine (PEPCID) IVPB 20 mg premix (20 mg Intravenous New Bag/Given 08/28/18 1734)  morphine 4 MG/ML injection 4 mg (4 mg Intravenous Given 08/28/18 1730)  ondansetron (ZOFRAN) injection 4 mg (4 mg Intravenous Given 08/28/18 1730)     Initial Impression / Assessment and Plan / ED Course  I have reviewed the triage vital signs and the nursing notes.  Pertinent labs & imaging results that were available during my care of the patient were reviewed by me and considered in my medical decision making (see chart for details).     PCAMDAN BURDIis a 83y.o. male with a past medical history significant for  dementia, Parkinson's disease, hypertension, hyperlipidemia, hypothyroidism, and chronic back pain  status post bilateral sacral plasty on 08/18/2018 who presents with abdominal distention, nausea, decreased oral intake, and abdominal pain.  Patient reports that for the last 4 days he has had worsening abdominal distention and abdominal pain.  He reports a diffuse aching pain that is also sharp at times.  He reports nausea but no significant vomiting.  He has not been able to eat or drink as much due to this.  He reports the pain gets up to a 10 out of 10 in severity but is currently an 8 out of 10.  He reports some chills.  He reports that he has not had good bowel movements in the last 4 days and is only had some small loose "squirts".  He denies any rectal bleeding.  He reports he has had some dysuria with urination at times.  He reports some dry cough but no production of sputum.  He denies any other trauma.  The abdominal discomfort is diffuse.  On exam, abdomen is distended and tender diffusely.  Minimal bowel sounds were appreciated.  Back and flank were non-tender.  No CVA tenderness.  Lungs clear and chest was nontender.  Patient was in abdominal discomfort.  Clinically I am concerned about obstruction given a reported remote history of appendectomy.  Constipation also considered given some pain medications provided with recent back procedure.  Urinalysis objective dysuria.  Chest x-ray be obtained due to the cough and chills.  Patient given pain medicine, nausea medicine, fluids, and will be made n.p.o.  Anticipate reassessment after imaging.  Initial laboratory testing was only significant for elevated alkaline phosphatase.  CT scan shows no acute obstruction.  Small hernia seen however patient not tender in these locations.  Due to the elevated alk phos and continued epigastric and right upper quadrant tenderness on reassessment, patient will have ultrasound to rule out cholecystitis.  If  ultrasound is negative, anticipate discharge home with reassuring work-up for outpatient and PCP follow-up.  Care transferred to Dr. Ashok Brandt while awaiting results of ultrasound.   Final Clinical Impressions(s) / ED Diagnoses   Final diagnoses:  Upper abdominal pain    ED Discharge Orders    None     Clinical Impression: 1. Upper abdominal pain     Disposition: Care transferred to Dr. Ashok Brandt  This note was prepared with assistance of Dragon voice recognition software. Occasional wrong-word or sound-a-like substitutions may have occurred due to the inherent limitations of voice recognition software.     Mahogany Torrance, Gwenyth Allegra, MD 08/28/18 [phone]

## 2018-08-28 NOTE — ED Notes (Signed)
Patient verbalizes understanding of discharge instructions. Opportunity for questioning and answers were provided. Armband removed by staff, pt discharged from ED ambulatory w/ family  

## 2018-08-28 NOTE — Discharge Instructions (Signed)
It was our pleasure to provide your ER care today - we hope that you feel better.  Rest. Drink plenty of fluids.  You may try antacid medication such as pepcid, prilosec, or maalox as need.   Follow up with primary care doctor in the next few days for recheck.  Return to ER if worse, new symptoms, high fevers, worsening or severe pain, persistent vomiting, chest pain, trouble breathing, or other concern.   You were given pain medication in the ER  - no driving for the next 4 hours.

## 2018-08-28 NOTE — ED Notes (Signed)
Attempted to obtain IV access. Pt is a difficult stick. IV team consulted

## 2018-08-29 LAB — URINE CULTURE: Culture: <10000 — AB

## 2018-09-02 LAB — CULTURE, BLOOD (ROUTINE X 2)
Culture: NO GROWTH
Culture: NO GROWTH
Special Requests: ADEQUATE
Special Requests: ADEQUATE

## 2019-01-09 ENCOUNTER — Ambulatory Visit
Admission: RE | Admit: 2019-01-09 | Discharge: 2019-01-09 | Disposition: A | Discharge status: Home / Self Care | Payer: Medicare (Managed Care) | Source: Ambulatory Visit | Attending: Internal Medicine | Admitting: Internal Medicine

## 2019-01-09 ENCOUNTER — Other Ambulatory Visit: Payer: Self-pay | Admitting: Internal Medicine

## 2019-01-09 ENCOUNTER — Other Ambulatory Visit: Payer: Self-pay

## 2019-01-09 DIAGNOSIS — R52 Pain, unspecified: Secondary | ICD-10-CM

## 2019-05-16 ENCOUNTER — Other Ambulatory Visit (HOSPITAL_COMMUNITY): Payer: Self-pay | Admitting: Internal Medicine

## 2019-05-17 ENCOUNTER — Other Ambulatory Visit: Payer: Self-pay | Admitting: Internal Medicine

## 2019-05-17 DIAGNOSIS — M810 Age-related osteoporosis without current pathological fracture: Secondary | ICD-10-CM

## 2019-08-01 ENCOUNTER — Other Ambulatory Visit: Payer: Self-pay | Admitting: Internal Medicine

## 2019-08-01 DIAGNOSIS — T148XXA Other injury of unspecified body region, initial encounter: Secondary | ICD-10-CM

## 2019-08-01 DIAGNOSIS — Z13820 Encounter for screening for osteoporosis: Secondary | ICD-10-CM

## 2019-08-03 ENCOUNTER — Ambulatory Visit
Admission: RE | Admit: 2019-08-03 | Discharge: 2019-08-03 | Disposition: A | Discharge status: Home / Self Care | Payer: Medicare (Managed Care) | Source: Ambulatory Visit | Attending: Internal Medicine | Admitting: Internal Medicine

## 2019-08-03 ENCOUNTER — Other Ambulatory Visit: Payer: Self-pay

## 2019-08-03 DIAGNOSIS — T148XXA Other injury of unspecified body region, initial encounter: Secondary | ICD-10-CM

## 2019-08-03 DIAGNOSIS — Z13820 Encounter for screening for osteoporosis: Secondary | ICD-10-CM

## 2019-09-24 ENCOUNTER — Other Ambulatory Visit (HOSPITAL_COMMUNITY): Payer: Self-pay | Admitting: Internal Medicine

## 2019-09-24 ENCOUNTER — Other Ambulatory Visit: Payer: Self-pay | Admitting: Internal Medicine

## 2019-09-24 DIAGNOSIS — K229 Disease of esophagus, unspecified: Secondary | ICD-10-CM

## 2019-09-28 ENCOUNTER — Ambulatory Visit (HOSPITAL_COMMUNITY)
Admission: RE | Admit: 2019-09-28 | Discharge: 2019-09-28 | Disposition: A | Discharge status: Home / Self Care | Payer: Medicare (Managed Care) | Source: Ambulatory Visit | Attending: Internal Medicine | Admitting: Internal Medicine

## 2019-09-28 ENCOUNTER — Other Ambulatory Visit: Payer: Self-pay

## 2019-09-28 DIAGNOSIS — K229 Disease of esophagus, unspecified: Secondary | ICD-10-CM | POA: Diagnosis present

## 2019-10-31 ENCOUNTER — Other Ambulatory Visit: Payer: Self-pay | Admitting: Gastroenterology

## 2019-10-31 DIAGNOSIS — R1084 Generalized abdominal pain: Secondary | ICD-10-CM

## 2019-11-06 ENCOUNTER — Other Ambulatory Visit: Payer: Self-pay | Admitting: Gastroenterology

## 2019-11-09 ENCOUNTER — Other Ambulatory Visit: Payer: Medicare (Managed Care)

## 2019-11-14 ENCOUNTER — Ambulatory Visit
Admission: RE | Admit: 2019-11-14 | Discharge: 2019-11-14 | Disposition: A | Discharge status: Home / Self Care | Payer: Medicare (Managed Care) | Source: Ambulatory Visit | Attending: Gastroenterology | Admitting: Gastroenterology

## 2019-11-14 ENCOUNTER — Other Ambulatory Visit: Payer: Medicare (Managed Care)

## 2019-11-14 DIAGNOSIS — R1084 Generalized abdominal pain: Secondary | ICD-10-CM

## 2019-11-16 ENCOUNTER — Other Ambulatory Visit: Payer: Medicare (Managed Care)

## 2019-11-17 ENCOUNTER — Other Ambulatory Visit (HOSPITAL_COMMUNITY): Admission: RE | Admit: 2019-11-17 | Payer: Medicare (Managed Care) | Source: Ambulatory Visit

## 2019-11-19 ENCOUNTER — Other Ambulatory Visit (HOSPITAL_COMMUNITY)
Admission: RE | Admit: 2019-11-19 | Discharge: 2019-11-19 | Disposition: A | Discharge status: Home / Self Care | Payer: Medicare (Managed Care) | Source: Ambulatory Visit | Attending: Gastroenterology | Admitting: Gastroenterology

## 2019-11-19 DIAGNOSIS — Z01812 Encounter for preprocedural laboratory examination: Secondary | ICD-10-CM | POA: Diagnosis present

## 2019-11-19 DIAGNOSIS — Z20822 Contact with and (suspected) exposure to covid-19: Secondary | ICD-10-CM | POA: Insufficient documentation

## 2019-11-20 LAB — SARS CORONAVIRUS 2 (TAT 6-24 HRS): SARS Coronavirus 2: NEGATIVE

## 2019-11-21 ENCOUNTER — Other Ambulatory Visit: Payer: Medicare (Managed Care)

## 2019-11-21 ENCOUNTER — Encounter (HOSPITAL_COMMUNITY)
Admission: RE | Disposition: A | Discharge status: Home / Self Care | Payer: Self-pay | Source: Home / Self Care | Attending: Gastroenterology

## 2019-11-21 ENCOUNTER — Ambulatory Visit (HOSPITAL_COMMUNITY): Payer: Medicare (Managed Care) | Admitting: Certified Registered"

## 2019-11-21 ENCOUNTER — Other Ambulatory Visit: Payer: Self-pay

## 2019-11-21 ENCOUNTER — Ambulatory Visit (HOSPITAL_COMMUNITY)
Admission: RE | Admit: 2019-11-21 | Discharge: 2019-11-21 | Disposition: A | Discharge status: Home / Self Care | Payer: Medicare (Managed Care) | Attending: Gastroenterology | Admitting: Gastroenterology

## 2019-11-21 ENCOUNTER — Ambulatory Visit
Admission: RE | Admit: 2019-11-21 | Discharge: 2019-11-21 | Disposition: A | Discharge status: Home / Self Care | Payer: Medicare (Managed Care) | Source: Ambulatory Visit | Attending: Gastroenterology | Admitting: Gastroenterology

## 2019-11-21 DIAGNOSIS — Z96653 Presence of artificial knee joint, bilateral: Secondary | ICD-10-CM | POA: Insufficient documentation

## 2019-11-21 DIAGNOSIS — Z7982 Long term (current) use of aspirin: Secondary | ICD-10-CM | POA: Diagnosis not present

## 2019-11-21 DIAGNOSIS — R1310 Dysphagia, unspecified: Secondary | ICD-10-CM | POA: Diagnosis not present

## 2019-11-21 DIAGNOSIS — R933 Abnormal findings on diagnostic imaging of other parts of digestive tract: Secondary | ICD-10-CM | POA: Insufficient documentation

## 2019-11-21 DIAGNOSIS — Z8619 Personal history of other infectious and parasitic diseases: Secondary | ICD-10-CM | POA: Insufficient documentation

## 2019-11-21 DIAGNOSIS — Z79890 Hormone replacement therapy (postmenopausal): Secondary | ICD-10-CM | POA: Diagnosis not present

## 2019-11-21 DIAGNOSIS — Z79899 Other long term (current) drug therapy: Secondary | ICD-10-CM | POA: Diagnosis not present

## 2019-11-21 DIAGNOSIS — Z8711 Personal history of peptic ulcer disease: Secondary | ICD-10-CM | POA: Insufficient documentation

## 2019-11-21 DIAGNOSIS — Z800 Family history of malignant neoplasm of digestive organs: Secondary | ICD-10-CM | POA: Diagnosis not present

## 2019-11-21 DIAGNOSIS — R1084 Generalized abdominal pain: Secondary | ICD-10-CM

## 2019-11-21 DIAGNOSIS — K2950 Unspecified chronic gastritis without bleeding: Secondary | ICD-10-CM | POA: Diagnosis not present

## 2019-11-21 HISTORY — PX: BIOPSY: SHX5522

## 2019-11-21 HISTORY — PX: ESOPHAGOGASTRODUODENOSCOPY (EGD) WITH PROPOFOL: SHX5813

## 2019-11-21 SURGERY — ESOPHAGOGASTRODUODENOSCOPY (EGD) WITH PROPOFOL
Anesthesia: Monitor Anesthesia Care

## 2019-11-21 MED ORDER — PROPOFOL 10 MG/ML IV BOLUS
INTRAVENOUS | Status: AC
  Filled 2019-11-21: qty 20

## 2019-11-21 MED ORDER — PROPOFOL 500 MG/50ML IV EMUL
INTRAVENOUS | Status: DC | PRN
  Administered 2019-11-21: 125 ug/kg/min via INTRAVENOUS

## 2019-11-21 MED ORDER — SODIUM CHLORIDE 0.9 % IV SOLN: INTRAVENOUS | Status: DC

## 2019-11-21 MED ORDER — PROPOFOL 10 MG/ML IV BOLUS
INTRAVENOUS | Status: DC | PRN
  Administered 2019-11-21 (×3): 10 mg via INTRAVENOUS

## 2019-11-21 MED ORDER — LACTATED RINGERS IV SOLN
INTRAVENOUS | Status: AC | PRN
  Administered 2019-11-21: 1000 mL via INTRAVENOUS

## 2019-11-21 MED ORDER — LACTATED RINGERS IV SOLN: INTRAVENOUS | Status: DC

## 2019-11-21 MED ORDER — LIDOCAINE 2% (20 MG/ML) 5 ML SYRINGE
INTRAMUSCULAR | Status: DC | PRN
  Administered 2019-11-21: 60 mg via INTRAVENOUS

## 2019-11-21 SURGICAL SUPPLY — 15 items

## 2019-11-21 NOTE — Anesthesia Procedure Notes (Signed)
Procedure Name: MAC Date/Time: 11/21/2019 1:13 PM Performed by: Eben Burow, CRNA Pre-anesthesia Checklist: Patient identified, Emergency Drugs available, Suction available, Patient being monitored and Timeout performed Oxygen Delivery Method: Simple face mask Dental Injury: Teeth and Oropharynx as per pre-operative assessment

## 2019-11-21 NOTE — Anesthesia Preprocedure Evaluation (Addendum)
Anesthesia Evaluation  Patient identified by MRN, date of birth, ID band Patient awake    Reviewed: Allergy & Precautions, NPO status , Patient's Chart, lab work & pertinent test results  History of Anesthesia Complications Negative for: history of anesthetic complications  Airway Mallampati: III  TM Distance: >3 FB Neck ROM: Limited    Dental  (+) Dental Advisory Given, Loose, Missing, Poor Dentition, Partial Upper, Partial Lower   Pulmonary sleep apnea (does not use CPAP) , COPD (last inhaler needed a week ago),  COPD inhaler,    breath sounds clear to auscultation       Cardiovascular hypertension, Pt. on medications (-) angina+ Peripheral Vascular Disease   Rhythm:Regular Rate:Normal     Neuro/Psych PSYCHIATRIC DISORDERS Anxiety Depression Dementia negative neurological ROS     GI/Hepatic GERD  Medicated,(+) Hepatitis -, A  Endo/Other  Hypothyroidism Morbid obesity  Renal/GU negative Renal ROS     Musculoskeletal  (+) Arthritis , Osteoarthritis,    Abdominal (+) + obese,   Peds  Hematology negative hematology ROS (+)   Anesthesia Other Findings   Reproductive/Obstetrics                             Anesthesia Physical  Anesthesia Plan  ASA: III  Anesthesia Plan: MAC   Post-op Pain Management:    Induction: Intravenous  PONV Risk Score and Plan: 1  Airway Management Planned: Natural Airway, Simple Face Mask, Nasal Cannula and Mask  Additional Equipment:   Intra-op Plan:   Post-operative Plan:   Informed Consent: I have reviewed the patients History and Physical, chart, labs and discussed the procedure including the risks, benefits and alternatives for the proposed anesthesia with the patient or authorized representative who has indicated his/her understanding and acceptance.     Dental advisory given  Plan Discussed with: CRNA, Surgeon and  Anesthesiologist  Anesthesia Plan Comments:        Anesthesia Quick Evaluation

## 2019-11-21 NOTE — H&P (Signed)
The patient is an 85 year old male who presents to the endoscopy unit at the hospital for EGD with possible dilatation.  He states he gets pain all across his abdomen which comes and goes for no apparent reason and sometimes he gets nausea when he eats.  He had an ulcer back in the 1960s.  He has been treated for H. pylori in the past.  A recent upper GI showed decreased distensibility of the distal esophagus and a barium tablet did not pass this region.  Radiologist recommended to consider EGD.  In discussing dysphagia with the patient he does not really know if things get stuck when he swallows or not.  Physical no distress  Heart regular rhythm  Lungs clear  Abdomen soft nontender  Impression: Possible distal esophageal stricture.  Plan: EGD with possible dilatation.  Rule out upper GI abnormality.

## 2019-11-21 NOTE — Discharge Instructions (Signed)
YOU HAD AN ENDOSCOPIC PROCEDURE TODAY: Refer to the procedure report and other information in the discharge instructions given to you for any specific questions about what was found during the examination. If this information does not answer your questions, please call [facility] office at [phone] to clarify.   YOU SHOULD EXPECT: Some feelings of bloating in the abdomen. Passage of more gas than usual. Walking can help get rid of the air that was put into your GI tract during the procedure and reduce the bloating.   DIET: Your first meal following the procedure should be a light meal and then it is ok to progress to your normal diet. A half-sandwich or bowl of soup is an example of a good first meal. Heavy or fried foods are harder to digest and may make you feel nauseous or bloated. Drink plenty of fluids but you should avoid alcoholic beverages for 24 hours.     ACTIVITY: Your care partner should take you home directly after the procedure. You should plan to take it easy, moving slowly for the rest of the day. You can resume normal activity the day after the procedure however YOU SHOULD NOT DRIVE, use power tools, machinery or perform tasks that involve climbing or major physical exertion for 24 hours (because of the sedation medicines used during the test).   SYMPTOMS TO REPORT IMMEDIATELY: A gastroenterologist can be reached at any hour. Please call [phone]  for any of the following symptoms:   Following upper endoscopy (EGD, EUS, ERCP, esophageal dilation) Vomiting of blood or coffee ground material  New, significant abdominal pain  New, significant chest pain or pain under the shoulder blades  Painful or persistently difficult swallowing  New shortness of breath  Black, tarry-looking or red, bloody stools  FOLLOW UP:  If any biopsies were taken you will be contacted by phone or by letter within the next 1-3 weeks. Call [phone]  if you have not heard about the biopsies in 3  weeks.  Please also call with any specific questions about appointments or follow up tests.  

## 2019-11-21 NOTE — Transfer of Care (Signed)
Immediate Anesthesia Transfer of Care Note  Patient: Kenneth Brandt  Procedure(s) Performed: ESOPHAGOGASTRODUODENOSCOPY (EGD) WITH PROPOFOL, WITH POSS DILATION IF STRICTURE FOUND (N/A )  Patient Location: PACU and Endoscopy Unit  Anesthesia Type:MAC  Level of Consciousness: drowsy and patient cooperative  Airway & Oxygen Therapy: Patient Spontanous Breathing and Patient connected to face mask oxygen  Post-op Assessment: Report given to RN and Post -op Vital signs reviewed and stable  Post vital signs: Reviewed and stable  Last Vitals:  Vitals Value Taken Time  BP    Temp    Pulse    Resp    SpO2      Last Pain:  Vitals:   11/21/19 1223  TempSrc: Oral  PainSc: 6          Complications: No apparent anesthesia complications

## 2019-11-21 NOTE — Op Note (Signed)
[facility] Patient Name: Kenneth Brandt Procedure Date: 11/21/2019 MRN: [id number] Attending MD: Graylin Shiver , MD Date of Birth: [birth year] CSN: [id number] Age: 85 Admit Type: Outpatient Procedure:                Upper GI endoscopy Indications:              Abnormal barium swallow. ? stricture Providers:                Graylin Shiver, MD, Zoe Lan, RN, Arlee Muslim Tech., Technician, Evelina Dun, CRNA Referring MD:              Medicines:                Propofol per Anesthesia Complications:            No immediate complications. Estimated Blood Loss:     Estimated blood loss was minimal. Procedure:                Pre-Anesthesia Assessment:                           - Prior to the procedure, a History and Physical                            was performed, and patient medications and                            allergies were reviewed. The patient's tolerance of                            previous anesthesia was also reviewed. The risks                            and benefits of the procedure and the sedation                            options and risks were discussed with the patient.                            All questions were answered, and informed consent                            was obtained. Prior Anticoagulants: The patient has                            taken no previous anticoagulant or antiplatelet                            agents. ASA Grade Assessment: II - A patient with                            mild systemic disease. After reviewing the risks  and benefits, the patient was deemed in                            satisfactory condition to undergo the procedure.                           After obtaining informed consent, the endoscope was                            passed under direct vision. Throughout the                            procedure, the patient's blood pressure, pulse, and                      oxygen saturations were monitored continuously. The                            [id number]-[id number] ([id number]) Olympus gastroscope was                            introduced through the mouth, and advanced to the                            second part of duodenum. The upper GI endoscopy was                            accomplished without difficulty. The patient                            tolerated the procedure well. Scope In: Scope Out: Findings:      The examined esophagus was normal.      The Z-line was found 38 cm from the incisors. No evidence of a stricture       on EGD. Finding on barium swallow most likeky from spasm.      The entire examined stomach was normal. Biopsies were taken with a cold       forceps for Helicobacter pylori testing.      The examined duodenum was normal. Impression:               - Normal esophagus.                           - Z-line, 38 cm from the incisors.                           - Normal stomach. Biopsied.                           - Normal examined duodenum. Moderate Sedation:      . Recommendation:           - Resume regular diet.                           - Continue present medications.                           -  Return to my office PRN. Procedure Code(s):        --- Professional ---                           [phone], Esophagogastroduodenoscopy, flexible,                            transoral; with biopsy, single or multiple Diagnosis Code(s):        --- Professional ---                           R93.3, Abnormal findings on diagnostic imaging of                            other parts of digestive tract CPT copyright 2019 American Medical Association. All rights reserved. The codes documented in this report are preliminary and upon coder review may  be revised to meet current compliance requirements. Wonda Horner, MD 11/21/2019 1:34:11 PM This report has been signed electronically. Number of Addenda: 0

## 2019-11-21 NOTE — Anesthesia Postprocedure Evaluation (Signed)
Anesthesia Post Note  Patient: TYREN DUGAR  Procedure(s) Performed: ESOPHAGOGASTRODUODENOSCOPY (EGD) WITH PROPOFOL, WITH POSS DILATION IF STRICTURE FOUND (N/A )     Patient location during evaluation: PACU Anesthesia Type: MAC Level of consciousness: awake and alert Pain management: pain level controlled Vital Signs Assessment: post-procedure vital signs reviewed and stable Respiratory status: spontaneous breathing, nonlabored ventilation, respiratory function stable and patient connected to nasal cannula oxygen Cardiovascular status: stable and blood pressure returned to baseline Postop Assessment: no apparent nausea or vomiting Anesthetic complications: no    Last Vitals:  Vitals:   11/21/19 1350 11/21/19 1400  BP: 130/67 137/79  Pulse: (!) 58 (!) 58  Resp: 19 17  Temp:    SpO2: 92% 95%    Last Pain:  Vitals:   11/21/19 1223  TempSrc: Oral  PainSc: 6                  Adon Gehlhausen

## 2019-11-22 ENCOUNTER — Encounter: Payer: Self-pay | Admitting: *Deleted

## 2019-11-22 ENCOUNTER — Other Ambulatory Visit: Payer: Self-pay

## 2019-11-22 LAB — SURGICAL PATHOLOGY

## 2019-12-27 ENCOUNTER — Other Ambulatory Visit (HOSPITAL_BASED_OUTPATIENT_CLINIC_OR_DEPARTMENT_OTHER): Payer: Self-pay

## 2019-12-27 DIAGNOSIS — R454 Irritability and anger: Secondary | ICD-10-CM

## 2019-12-27 DIAGNOSIS — G4761 Periodic limb movement disorder: Secondary | ICD-10-CM

## 2019-12-27 DIAGNOSIS — R5383 Other fatigue: Secondary | ICD-10-CM

## 2019-12-27 DIAGNOSIS — G4709 Other insomnia: Secondary | ICD-10-CM

## 2019-12-27 DIAGNOSIS — R0683 Snoring: Secondary | ICD-10-CM

## 2019-12-27 DIAGNOSIS — G4710 Hypersomnia, unspecified: Secondary | ICD-10-CM

## 2020-01-04 ENCOUNTER — Other Ambulatory Visit (HOSPITAL_COMMUNITY)
Admission: RE | Admit: 2020-01-04 | Discharge: 2020-01-04 | Disposition: A | Discharge status: Home / Self Care | Payer: Medicare (Managed Care) | Source: Ambulatory Visit | Attending: Internal Medicine | Admitting: Internal Medicine

## 2020-01-04 DIAGNOSIS — Z20822 Contact with and (suspected) exposure to covid-19: Secondary | ICD-10-CM | POA: Diagnosis not present

## 2020-01-04 DIAGNOSIS — Z01812 Encounter for preprocedural laboratory examination: Secondary | ICD-10-CM | POA: Diagnosis present

## 2020-01-04 LAB — SARS CORONAVIRUS 2 (TAT 6-24 HRS): SARS Coronavirus 2: NEGATIVE

## 2020-01-08 ENCOUNTER — Encounter (HOSPITAL_BASED_OUTPATIENT_CLINIC_OR_DEPARTMENT_OTHER): Payer: Medicare (Managed Care) | Admitting: Internal Medicine

## 2020-01-21 ENCOUNTER — Ambulatory Visit: Payer: Self-pay | Admitting: Surgery

## 2020-01-21 NOTE — H&P (Signed)
Kenneth Brandt Appointment: 01/21/2020 9:40 AM Location: [facility] Patient #: [id number] DOB: [birth year] Undefined / Language: [name] / Race: White Male  History of Present Illness Kenneth Brandt A. Kenneth Brandt; 01/21/2020 10:23 AM) Patient words: Patient presents for evaluation of abdominal pain. He's had chronic diffuse abdominal pain over the last 6 months. He has undergone significant gastrointestinal workup to include upper endoscopy this year which was normal, no lower gastrointestinal study which was normal. He was noted to have gallstones by ultrasound in March 2021. Phimosis pain is after eating lasting minutes to hours. It is moderate in intensity and is located throughout his upper abdomen he states. There is no radiation of pain. There is no lower abdominal pain. He does have issues with mild constipation. He denies weight loss. He is unclear certain foods bring the pain on but does seem to occur after eating he states.    Generalized abdominal pain  EXAM: ABDOMEN ULTRASOUND COMPLETE  COMPARISON: 08/28/2018  FINDINGS: Gallbladder: Gallbladder wall thickening measuring up to 5 mm. Hyperdense, shadowing stone within the region of the gallbladder neck measuring 5 mm in diameter. Sonographic [name] sign is equivocal given diffuse abdominal tenderness reported by the sonographer.  Common bile duct: Diameter: 6 mm  Liver: No focal lesion identified. Within normal limits in parenchymal echogenicity. Portal vein is patent on color Doppler imaging with normal direction of blood flow towards the liver.  IVC: No abnormality visualized.  Pancreas: Visualized portion unremarkable.  Spleen: Size and appearance within normal limits.  Right Kidney: Length: 12.3 cm. Echogenicity within normal limits. Exophytic 6.9 cm lower pole cyst. No solid mass or hydronephrosis visualized.  Left Kidney: Length: 11.8 cm. Echogenicity within normal limits. No mass or  hydronephrosis visualized.  Abdominal aorta: Visualized portion the proximal abdominal aorta measures up to 3.4 cm in diameter and distally measures 2.9 cm.  Other findings: None.  IMPRESSION: 1. Cholelithiasis with gallbladder wall thickening is suspicious for cholecystitis. If further imaging evaluation is warranted, a nuclear medicine hepatobiliary scan could be performed to evaluate the patency of the cystic duct. 2. Abdominal aorta measures up to 3.4 cm. Recommend followup by ultrasound in 3 years. This recommendation follows ACR consensus guidelines: White Paper of the ACR Incidental Findings Committee II on Vascular Findings. J Am Coll Radiol 2013; 13:086-578 3. Lower pole right renal cyst measuring up to 6.9 cm.  These results will be called to the ordering clinician or representative by the Radiologist Assistant, and communication documented in the PACS or [employer].   Electronically Signed By: Duanne Guess D.O. On: 11/21/2019 15:43.  The patient is a 85 year old male.   Past Surgical History Kenneth Brandt, CMA; 01/21/2020 10:07 AM) Appendectomy Cataract Surgery Bilateral. Foot Surgery Left. Knee Surgery Bilateral. Spinal Surgery - Lower Back Tonsillectomy  Diagnostic Studies History Kenneth Brandt, CMA; 01/21/2020 10:07 AM) Colonoscopy 1-5 years ago  Allergies Kenneth Brandt, CMA; 01/21/2020 9:33 AM) No Known Drug Allergies [01/21/2020]: Allergies Reconciled  Medication History Kenneth Brandt, CMA; 01/21/2020 9:35 AM) Atorvastatin Calcium (20MG  Tablet, Oral) Active. Aspirin (81MG  Tablet, Oral) Active. DULoxetine HCl (60MG  Capsule DR Part, Oral) Active. Levothyroxine Sodium ( Tablet, Oral) Active. LORazepam (1MG  Tablet, Oral) Active. Losartan Potassium (50MG  Tablet, Oral) Active. Lyrica (75MG  Capsule, Oral) Active. Morphine Sulfate (15MG  Tablet ER 12HR, Oral) Active. Pantoprazole Sodium (40MG  Packet, Oral)  Active. Senna (8.6MG  Tablet, Oral) Active. Torsemide (20MG  Tablet, Oral) Active. Vitamin D3 (10 MCG(400 UNIT) Capsule, Oral) Active. Medications Reconciled  Social History R. ,  CMA; 01/21/2020 10:07 AM) Caffeine use Tea. No alcohol use No drug use Tobacco use Never smoker.  Family History Kenneth Duster Kenneth Brandt, CMA; 01/21/2020 10:07 AM) Arthritis Daughter, Father. Breast Cancer Sister. Cancer Mother. Colon Polyps Sister. Depression Daughter, Father, Mother. Heart Disease Father, Sister. Heart disease in male family member before age 103 Heart disease in male family member before age 52 Hypertension Father. Kidney Disease Sister. Migraine Headache Daughter.  Other Problems Kenneth Brandt, CMA; 01/21/2020 10:07 AM) Anxiety Disorder Arthritis Back Pain Cholelithiasis Depression Gastric Ulcer Hemorrhoids Hepatitis High blood pressure Hypercholesterolemia Sleep Apnea     Review of Systems [employer] R. Brandt CMA; 01/21/2020 10:07 AM) General Present- Fatigue and Night Sweats. Not Present- Appetite Loss, Chills, Fever, Weight Gain and Weight Loss. Skin Not Present- Change in Wart/Mole, Dryness, Hives, Jaundice, New Lesions, Non-Healing Wounds, Rash and Ulcer. HEENT Present- Hoarseness, Oral Ulcers, Seasonal Allergies and Wears glasses/contact lenses. Not Present- Earache, Hearing Loss, Nose Bleed, Ringing in the Ears, Sinus Pain, Sore Throat, Visual Disturbances and Yellow Eyes. Respiratory Present- Difficulty Breathing, Snoring and Wheezing. Not Present- Bloody sputum and Chronic Cough. Breast Not Present- Breast Mass, Breast Pain, Nipple Discharge and Skin Changes. Cardiovascular Present- Difficulty Breathing Lying Down, Leg Cramps, Shortness of Breath and Swelling of Extremities. Not Present- Chest Pain, Palpitations and Rapid Heart Rate. Gastrointestinal Present- Abdominal Pain, Bloating, Change in Bowel Habits, Constipation,  Excessive gas, Hemorrhoids, Indigestion, Nausea, Rectal Pain and Vomiting. Not Present- Bloody Stool, Chronic diarrhea, Difficulty Swallowing and Gets full quickly at meals. Male Genitourinary Present- Frequency, Nocturia, Urgency and Urine Leakage. Not Present- Blood in Urine, Change in Urinary Stream, Impotence and Painful Urination. Musculoskeletal Present- Back Pain, Joint Pain, Joint Stiffness, Muscle Pain and Swelling of Extremities. Not Present- Muscle Weakness. Neurological Present- Decreased Memory, Headaches, Numbness, Tremor and Trouble walking. Not Present- Fainting, Seizures, Tingling and Weakness. Psychiatric Present- Anxiety and Depression. Not Present- Bipolar, Change in Sleep Pattern, Fearful and Frequent crying. Endocrine Present- Hot flashes. Not Present- Cold Intolerance, Excessive Hunger, Hair Changes, Heat Intolerance and New Diabetes. Hematology Present- Easy Bruising. Not Present- Blood Thinners, Excessive bleeding, Gland problems, HIV and Persistent Infections.  Vitals Draylen Lobue CMA; 01/21/2020 9:33 AM) 01/21/2020 9:32 AM Weight: 242 lb Height: 68in Body Surface Area: 2.22 m Body Mass Index: 36.8 kg/m  Temp.: 97.89F  Pulse: 82 (Regular)  BP: 104/60(Sitting, Left Arm, Standard)        Physical Exam (Adaliah Hiegel A. Kin Galbraith Brandt; 01/21/2020 10:23 AM)  General Mental Status-Alert. General Appearance-Consistent with stated age. Hydration-Well hydrated. Voice-Normal.  Head and Neck Head-normocephalic, atraumatic with no lesions or palpable masses.  Eye Eyeball - Bilateral-Extraocular movements intact. Sclera/Conjunctiva - Bilateral-No scleral icterus.  Chest and Lung Exam Chest and lung exam reveals -quiet, even and easy respiratory effort with no use of accessory muscles and on auscultation, normal breath sounds, no adventitious sounds and normal vocal resonance. Inspection Chest Wall - Normal. Back -  normal.  Cardiovascular Cardiovascular examination reveals -on palpation PMI is normal in location and amplitude, no palpable S3 or S4. Normal cardiac borders., normal heart sounds, regular rate and rhythm with no murmurs, carotid auscultation reveals no bruits and normal pedal pulses bilaterally.  Abdomen Inspection Inspection of the abdomen reveals - No Hernias. Skin - Scar - no surgical scars. Palpation/Percussion Palpation and Percussion of the abdomen reveal - Soft, Non Tender, No Rebound tenderness, No Rigidity (guarding) and No hepatosplenomegaly. Auscultation Auscultation of the abdomen reveals - Bowel sounds normal.  Neurologic Neurologic evaluation reveals -alert and  oriented x 3 with no impairment of recent or remote memory. Mental Status-Normal.  Musculoskeletal Normal Exam - Left-Upper Extremity Strength Normal and Lower Extremity Strength Normal. Normal Exam - Right-Upper Extremity Strength Normal, Lower Extremity Weakness.    Assessment & Plan (Ladrea Holladay A. Layci Stenglein Brandt; 01/21/2020 10:24 AM)  CHRONIC CHOLECYSTITIS WITH CALCULUS (K80.10) Impression: Recommend laparoscopic cholecystectomy with possible cholangiogram. Discussed this with the patient and his daughter today. Discussed potential risk of having to convert to an open procedure given his presentation time that he has been having symptoms being somewhat prolonged. Risk of open procedure is about 10%. Discussed risk, bile duct injury circumstances, the role of partial cholecystectomy circumstances and potentially other complications of surgery. Discussed nonoperative management but his symptoms seem to be worsening and therefore I do not think this would be best for him. The procedure has been discussed with the patient. Risks of laparoscopic cholecystectomy include bleeding, infection, bile duct injury, leak, death, open surgery, diarrhea, other surgery, organ injury, blood vessel injury, DVT, and additional  care.  total time 35 minutes  Current Plans You are being scheduled for surgery- Our schedulers will call you.  You should hear from our office's scheduling department within 5 working days about the location, date, and time of surgery. We try to make accommodations for patient's preferences in scheduling surgery, but sometimes the OR schedule or the surgeon's schedule prevents [place] from making those accommodations.  If you have not heard from our office [phone]) in 5 working days, call the office and ask for your surgeon's nurse.  If you have other questions about your diagnosis, plan, or surgery, call the office and ask for your surgeon's nurse.  Pt Education - Pamphlet Given - Laparoscopic Gallbladder Surgery: discussed with patient and provided information. The anatomy & physiology of hepatobiliary & pancreatic function was discussed. The pathophysiology of gallbladder dysfunction was discussed. Natural history risks without surgery was discussed. I feel the risks of no intervention will lead to serious problems that outweigh the operative risks; therefore, I recommended cholecystectomy to remove the pathology. I explained laparoscopic techniques with possible need for an open approach. Probable cholangiogram to evaluate the bilary tract was explained as well.  Risks such as bleeding, infection, abscess, leak, injury to other organs, need for further treatment, heart attack, death, and other risks were discussed. I noted a good likelihood this will help address the problem. Possibility that this will not correct all abdominal symptoms was explained. Goals of post-operative recovery were discussed as well. We will work to minimize complications. An educational handout further explaining the pathology and treatment options was given as well. Questions were answered. The patient expresses understanding & wishes to proceed with surgery.  Pt Education - [facility] Laparosopic Post Op  HCI (Gross) Pt Education - [facility] Good Bowel Health (Gross) Pt Education - Laparoscopic Cholecystectomy: gallbladder

## 2020-01-21 NOTE — H&P (View-Only) (Signed)
Kenneth Brandt Appointment: 01/21/2020 9:40 AM Location: [facility] Patient #: [id number] DOB: [birth year] Undefined / Language: [name] / Race: White Male  History of Present Illness Kenneth Fus A. Perry Molla MD; 01/21/2020 10:23 AM) Patient words: Patient presents for evaluation of abdominal pain. He's had chronic diffuse abdominal pain over the last 6 months. He has undergone significant gastrointestinal workup to include upper endoscopy this year which was normal, no lower gastrointestinal study which was normal. He was noted to have gallstones by ultrasound in March 2021. Phimosis pain is after eating lasting minutes to hours. It is moderate in intensity and is located throughout his upper abdomen he states. There is no radiation of pain. There is no lower abdominal pain. He does have issues with mild constipation. He denies weight loss. He is unclear certain foods bring the pain on but does seem to occur after eating he states.    Generalized abdominal pain  EXAM: ABDOMEN ULTRASOUND COMPLETE  COMPARISON: 08/28/2018  FINDINGS: Gallbladder: Gallbladder wall thickening measuring up to 5 mm. Hyperdense, shadowing stone within the region of the gallbladder neck measuring 5 mm in diameter. Sonographic Kenneth Brandt sign is equivocal given diffuse abdominal tenderness reported by the sonographer.  Common bile duct: Diameter: 6 mm  Liver: No focal lesion identified. Within normal limits in parenchymal echogenicity. Portal vein is patent on color Doppler imaging with normal direction of blood flow towards the liver.  IVC: No abnormality visualized.  Pancreas: Visualized portion unremarkable.  Spleen: Size and appearance within normal limits.  Right Kidney: Length: 12.3 cm. Echogenicity within normal limits. Exophytic 6.9 cm lower pole cyst. No solid mass or hydronephrosis visualized.  Left Kidney: Length: 11.8 cm. Echogenicity within normal limits. No mass or  hydronephrosis visualized.  Abdominal aorta: Visualized portion the proximal abdominal aorta measures up to 3.4 cm in diameter and distally measures 2.9 cm.  Other findings: None.  IMPRESSION: 1. Cholelithiasis with gallbladder wall thickening is suspicious for cholecystitis. If further imaging evaluation is warranted, a nuclear medicine hepatobiliary scan could be performed to evaluate the patency of the cystic duct. 2. Abdominal aorta measures up to 3.4 cm. Recommend followup by ultrasound in 3 years. This recommendation follows ACR consensus guidelines: White Paper of the ACR Incidental Findings Committee II on Vascular Findings. J Am Coll Radiol 2013; 13:086-578 3. Lower pole right renal cyst measuring up to 6.9 cm.  These results will be called to the ordering clinician or representative by the Radiologist Assistant, and communication documented in the PACS or [employer].   Electronically Signed By: Duanne Guess D.O. On: 11/21/2019 15:43.  The patient is a 85 year old male.   Past Surgical History Marcelino Duster R. Brooks, CMA; 01/21/2020 10:07 AM) Appendectomy Cataract Surgery Bilateral. Foot Surgery Left. Knee Surgery Bilateral. Spinal Surgery - Lower Back Tonsillectomy  Diagnostic Studies History Marcelino Duster R. Brooks, CMA; 01/21/2020 10:07 AM) Colonoscopy 1-5 years ago  Allergies Heyward Douthit, CMA; 01/21/2020 9:33 AM) No Known Drug Allergies [01/21/2020]: Allergies Reconciled  Medication History Norberto Wishon, CMA; 01/21/2020 9:35 AM) Atorvastatin Calcium (20MG  Tablet, Oral) Active. Aspirin (81MG  Tablet, Oral) Active. DULoxetine HCl (60MG  Capsule DR Part, Oral) Active. Levothyroxine Sodium ( Tablet, Oral) Active. LORazepam (1MG  Tablet, Oral) Active. Losartan Potassium (50MG  Tablet, Oral) Active. Lyrica (75MG  Capsule, Oral) Active. Morphine Sulfate (15MG  Tablet ER 12HR, Oral) Active. Pantoprazole Sodium (40MG  Packet, Oral)  Active. Senna (8.6MG  Tablet, Oral) Active. Torsemide (20MG  Tablet, Oral) Active. Vitamin D3 (10 MCG(400 UNIT) Capsule, Oral) Active. Medications Reconciled  Social History R. ,  CMA; 01/21/2020 10:07 AM) Caffeine use Tea. No alcohol use No drug use Tobacco use Never smoker.  Family History (Michelle R. Brooks, CMA; 01/21/2020 10:07 AM) Arthritis Daughter, Father. Breast Cancer Sister. Cancer Mother. Colon Polyps Sister. Depression Daughter, Father, Mother. Heart Disease Father, Sister. Heart disease in male family member before age 65 Heart disease in male family member before age 55 Hypertension Father. Kidney Disease Sister. Migraine Headache Daughter.  Other Problems (Michelle R. Brooks, CMA; 01/21/2020 10:07 AM) Anxiety Disorder Arthritis Back Pain Cholelithiasis Depression Gastric Ulcer Hemorrhoids Hepatitis High blood pressure Hypercholesterolemia Sleep Apnea     Review of Systems (Michelle R. Brooks CMA; 01/21/2020 10:07 AM) General Present- Fatigue and Night Sweats. Not Present- Appetite Loss, Chills, Fever, Weight Gain and Weight Loss. Skin Not Present- Change in Wart/Mole, Dryness, Hives, Jaundice, New Lesions, Non-Healing Wounds, Rash and Ulcer. HEENT Present- Hoarseness, Oral Ulcers, Seasonal Allergies and Wears glasses/contact lenses. Not Present- Earache, Hearing Loss, Nose Bleed, Ringing in the Ears, Sinus Pain, Sore Throat, Visual Disturbances and Yellow Eyes. Respiratory Present- Difficulty Breathing, Snoring and Wheezing. Not Present- Bloody sputum and Chronic Cough. Breast Not Present- Breast Mass, Breast Pain, Nipple Discharge and Skin Changes. Cardiovascular Present- Difficulty Breathing Lying Down, Leg Cramps, Shortness of Breath and Swelling of Extremities. Not Present- Chest Pain, Palpitations and Rapid Heart Rate. Gastrointestinal Present- Abdominal Pain, Bloating, Change in Bowel Habits, Constipation,  Excessive gas, Hemorrhoids, Indigestion, Nausea, Rectal Pain and Vomiting. Not Present- Bloody Stool, Chronic diarrhea, Difficulty Swallowing and Gets full quickly at meals. Male Genitourinary Present- Frequency, Nocturia, Urgency and Urine Leakage. Not Present- Blood in Urine, Change in Urinary Stream, Impotence and Painful Urination. Musculoskeletal Present- Back Pain, Joint Pain, Joint Stiffness, Muscle Pain and Swelling of Extremities. Not Present- Muscle Weakness. Neurological Present- Decreased Memory, Headaches, Numbness, Tremor and Trouble walking. Not Present- Fainting, Seizures, Tingling and Weakness. Psychiatric Present- Anxiety and Depression. Not Present- Bipolar, Change in Sleep Pattern, Fearful and Frequent crying. Endocrine Present- Hot flashes. Not Present- Cold Intolerance, Excessive Hunger, Hair Changes, Heat Intolerance and New Diabetes. Hematology Present- Easy Bruising. Not Present- Blood Thinners, Excessive bleeding, Gland problems, HIV and Persistent Infections.  Vitals (Kelsey Kenneth CMA; 01/21/2020 9:33 AM) 01/21/2020 9:32 AM Weight: 242 lb Height: 68in Body Surface Area: 2.22 m Body Mass Index: 36.8 kg/m  Temp.: 97.8F  Pulse: 82 (Regular)  BP: 104/60(Sitting, Left Arm, Standard)        Physical Exam (Kynsie Falkner A. Zadaya Cuadra MD; 01/21/2020 10:23 AM)  General Mental Status-Alert. General Appearance-Consistent with stated age. Hydration-Well hydrated. Voice-Normal.  Head and Neck Head-normocephalic, atraumatic with no lesions or palpable masses.  Eye Eyeball - Bilateral-Extraocular movements intact. Sclera/Conjunctiva - Bilateral-No scleral icterus.  Chest and Lung Exam Chest and lung exam reveals -quiet, even and easy respiratory effort with no use of accessory muscles and on auscultation, normal breath sounds, no adventitious sounds and normal vocal resonance. Inspection Chest Wall - Normal. Back -  normal.  Cardiovascular Cardiovascular examination reveals -on palpation PMI is normal in location and amplitude, no palpable S3 or S4. Normal cardiac borders., normal heart sounds, regular rate and rhythm with no murmurs, carotid auscultation reveals no bruits and normal pedal pulses bilaterally.  Abdomen Inspection Inspection of the abdomen reveals - No Hernias. Skin - Scar - no surgical scars. Palpation/Percussion Palpation and Percussion of the abdomen reveal - Soft, Non Tender, No Rebound tenderness, No Rigidity (guarding) and No hepatosplenomegaly. Auscultation Auscultation of the abdomen reveals - Bowel sounds normal.  Neurologic Neurologic evaluation reveals -alert and   oriented x 3 with no impairment of recent or remote memory. Mental Status-Normal.  Musculoskeletal Normal Exam - Left-Upper Extremity Strength Normal and Lower Extremity Strength Normal. Normal Exam - Right-Upper Extremity Strength Normal, Lower Extremity Weakness.    Assessment & Plan (Colt Martelle A. Jacion Dismore MD; 01/21/2020 10:24 AM)  CHRONIC CHOLECYSTITIS WITH CALCULUS (K80.10) Impression: Recommend laparoscopic cholecystectomy with possible cholangiogram. Discussed this with the patient and his daughter today. Discussed potential risk of having to convert to an open procedure given his presentation time that he has been having symptoms being somewhat prolonged. Risk of open procedure is about 10%. Discussed risk, bile duct injury circumstances, the role of partial cholecystectomy circumstances and potentially other complications of surgery. Discussed nonoperative management but his symptoms seem to be worsening and therefore I do not think this would be best for him. The procedure has been discussed with the patient. Risks of laparoscopic cholecystectomy include bleeding, infection, bile duct injury, leak, death, open surgery, diarrhea, other surgery, organ injury, blood vessel injury, DVT, and additional  care.  total time 35 minutes  Current Plans You are being scheduled for surgery- Our schedulers will call you.  You should hear from our office's scheduling department within 5 working days about the location, date, and time of surgery. We try to make accommodations for patient's preferences in scheduling surgery, but sometimes the OR schedule or the surgeon's schedule prevents [place] from making those accommodations.  If you have not heard from our office [phone]) in 5 working days, call the office and ask for your surgeon's nurse.  If you have other questions about your diagnosis, plan, or surgery, call the office and ask for your surgeon's nurse.  Pt Education - Pamphlet Given - Laparoscopic Gallbladder Surgery: discussed with patient and provided information. The anatomy & physiology of hepatobiliary & pancreatic function was discussed. The pathophysiology of gallbladder dysfunction was discussed. Natural history risks without surgery was discussed. I feel the risks of no intervention will lead to serious problems that outweigh the operative risks; therefore, I recommended cholecystectomy to remove the pathology. I explained laparoscopic techniques with possible need for an open approach. Probable cholangiogram to evaluate the bilary tract was explained as well.  Risks such as bleeding, infection, abscess, leak, injury to other organs, need for further treatment, heart attack, death, and other risks were discussed. I noted a good likelihood this will help address the problem. Possibility that this will not correct all abdominal symptoms was explained. Goals of post-operative recovery were discussed as well. We will work to minimize complications. An educational handout further explaining the pathology and treatment options was given as well. Questions were answered. The patient expresses understanding & wishes to proceed with surgery.  Pt Education - [facility] Laparosopic Post Op  HCI (Gross) Pt Education - [facility] Good Bowel Health (Gross) Pt Education - Laparoscopic Cholecystectomy: gallbladder

## 2020-02-11 ENCOUNTER — Other Ambulatory Visit (HOSPITAL_COMMUNITY)
Admission: RE | Admit: 2020-02-11 | Discharge: 2020-02-11 | Disposition: A | Discharge status: Home / Self Care | Payer: Medicare (Managed Care) | Source: Ambulatory Visit | Attending: Surgery | Admitting: Surgery

## 2020-02-11 DIAGNOSIS — Z01812 Encounter for preprocedural laboratory examination: Secondary | ICD-10-CM | POA: Insufficient documentation

## 2020-02-11 DIAGNOSIS — Z20822 Contact with and (suspected) exposure to covid-19: Secondary | ICD-10-CM | POA: Diagnosis not present

## 2020-02-11 LAB — SARS CORONAVIRUS 2 (TAT 6-24 HRS): SARS Coronavirus 2: NEGATIVE

## 2020-02-13 ENCOUNTER — Other Ambulatory Visit: Payer: Self-pay

## 2020-02-13 ENCOUNTER — Encounter (HOSPITAL_COMMUNITY): Payer: Self-pay | Admitting: Surgery

## 2020-02-13 NOTE — Progress Notes (Signed)
   02/13/20 1353  OBSTRUCTIVE SLEEP APNEA  Have you ever been diagnosed with sleep apnea through a sleep study? No  Do you snore loudly (loud enough to be heard through closed doors)?  0  Do you often feel tired, fatigued, or sleepy during the daytime (such as falling asleep during driving or talking to someone)? 0  Has anyone observed you stop breathing during your sleep? 1  Do you have, or are you being treated for high blood pressure? 1  BMI more than 35 kg/m2? 1  Age > 50 (1-yes) 1  Neck circumference greater than:Male 16 inches or larger, Male 17inches or larger? 0  Male Gender (Yes=1) 1  Obstructive Sleep Apnea Score 5  Score 5 or greater  Results sent to PCP

## 2020-02-13 NOTE — Progress Notes (Signed)
Patient denies shortness of breath, fever, cough or chest pain.  PCP - [facility] Cardiologist - n/a  Chest x-ray - n/a EKG - DOS 02/14/20 Stress Test - 04/25/15 ECHO - 12/15/17 Cardiac Cath - n/a  Sleep Study - Yes (2017 [place])  Will be getting another Sleep Study test soon per patient. CPAP - Does not use CPAP  Aspirin Instructions: Follow your surgeon's instructions on when to stop aspirin prior to surgery,  If no instructions were given by your surgeon then you will need to call the office for those instructions.  ERAS:  Clears til 11:45 am, no drink.  Anesthesia review: Yes  STOP now taking any Aspirin (unless otherwise instructed by your surgeon), Aleve, Naproxen, Ibuprofen, Motrin, Advil, Goody's, BC's, all herbal medications, fish oil, and all vitamins.   Coronavirus Screening Covid test 02/11/20 was negative.  Patient verbalized understanding of instructions that were given via phone.

## 2020-02-14 ENCOUNTER — Ambulatory Visit (HOSPITAL_COMMUNITY): Payer: Medicare (Managed Care) | Admitting: Physician Assistant

## 2020-02-14 ENCOUNTER — Ambulatory Visit (HOSPITAL_COMMUNITY)
Admission: RE | Admit: 2020-02-14 | Discharge: 2020-02-14 | Disposition: A | Discharge status: Home / Self Care | Payer: Medicare (Managed Care) | Attending: Surgery | Admitting: Surgery

## 2020-02-14 ENCOUNTER — Encounter (HOSPITAL_COMMUNITY): Payer: Self-pay | Admitting: Surgery

## 2020-02-14 ENCOUNTER — Encounter (HOSPITAL_COMMUNITY)
Admission: RE | Disposition: A | Discharge status: Home / Self Care | Payer: Self-pay | Source: Home / Self Care | Attending: Surgery

## 2020-02-14 DIAGNOSIS — Z79899 Other long term (current) drug therapy: Secondary | ICD-10-CM | POA: Diagnosis not present

## 2020-02-14 DIAGNOSIS — F329 Major depressive disorder, single episode, unspecified: Secondary | ICD-10-CM | POA: Insufficient documentation

## 2020-02-14 DIAGNOSIS — F419 Anxiety disorder, unspecified: Secondary | ICD-10-CM | POA: Diagnosis not present

## 2020-02-14 DIAGNOSIS — G4730 Sleep apnea, unspecified: Secondary | ICD-10-CM | POA: Diagnosis not present

## 2020-02-14 DIAGNOSIS — Z7982 Long term (current) use of aspirin: Secondary | ICD-10-CM | POA: Diagnosis not present

## 2020-02-14 DIAGNOSIS — E7800 Pure hypercholesterolemia, unspecified: Secondary | ICD-10-CM | POA: Insufficient documentation

## 2020-02-14 DIAGNOSIS — Z79890 Hormone replacement therapy (postmenopausal): Secondary | ICD-10-CM | POA: Diagnosis not present

## 2020-02-14 DIAGNOSIS — K8000 Calculus of gallbladder with acute cholecystitis without obstruction: Secondary | ICD-10-CM | POA: Diagnosis present

## 2020-02-14 DIAGNOSIS — I10 Essential (primary) hypertension: Secondary | ICD-10-CM | POA: Insufficient documentation

## 2020-02-14 DIAGNOSIS — E039 Hypothyroidism, unspecified: Secondary | ICD-10-CM | POA: Insufficient documentation

## 2020-02-14 DIAGNOSIS — I739 Peripheral vascular disease, unspecified: Secondary | ICD-10-CM | POA: Insufficient documentation

## 2020-02-14 DIAGNOSIS — K811 Chronic cholecystitis: Secondary | ICD-10-CM | POA: Insufficient documentation

## 2020-02-14 DIAGNOSIS — F0280 Dementia in other diseases classified elsewhere without behavioral disturbance: Secondary | ICD-10-CM | POA: Diagnosis not present

## 2020-02-14 DIAGNOSIS — G20 Parkinson's disease: Secondary | ICD-10-CM | POA: Diagnosis not present

## 2020-02-14 DIAGNOSIS — Z79891 Long term (current) use of opiate analgesic: Secondary | ICD-10-CM | POA: Insufficient documentation

## 2020-02-14 HISTORY — DX: Presence of dental prosthetic device (complete) (partial): Z97.2

## 2020-02-14 HISTORY — DX: Myoneural disorder, unspecified: G70.9

## 2020-02-14 HISTORY — DX: Other seasonal allergic rhinitis: J30.2

## 2020-02-14 HISTORY — DX: Presence of spectacles and contact lenses: Z97.3

## 2020-02-14 HISTORY — DX: Other chronic pain: G89.29

## 2020-02-14 HISTORY — DX: Gastro-esophageal reflux disease without esophagitis: K21.9

## 2020-02-14 HISTORY — DX: Herpesviral infection, unspecified: B00.9

## 2020-02-14 HISTORY — PX: CHOLECYSTECTOMY: SHX55

## 2020-02-14 LAB — COMPREHENSIVE METABOLIC PANEL
ALT: 15 U/L (ref 0–44)
AST: 17 U/L (ref 15–41)
Albumin: 3.3 g/dL — ABNORMAL LOW (ref 3.5–5.0)
Alkaline Phosphatase: 85 U/L (ref 38–126)
Anion gap: 9 (ref 5–15)
BUN: 19 mg/dL (ref 8–23)
CO2: 27 mmol/L (ref 22–32)
Calcium: 8.8 mg/dL — ABNORMAL LOW (ref 8.9–10.3)
Chloride: 98 mmol/L (ref 98–111)
Creatinine, Ser: 1.09 mg/dL (ref 0.61–1.24)
GFR calc Af Amer: >60 mL/min (ref >60)
GFR calc non Af Amer: >60 mL/min (ref >60)
Glucose, Bld: 102 mg/dL — ABNORMAL HIGH (ref 70–99)
Potassium: 3.7 mmol/L (ref 3.5–5.1)
Sodium: 134 mmol/L — ABNORMAL LOW (ref 135–145)
Total Bilirubin: 0.6 mg/dL (ref 0.3–1.2)
Total Protein: 6.1 g/dL — ABNORMAL LOW (ref 6.5–8.1)

## 2020-02-14 LAB — CBC WITH DIFFERENTIAL/PLATELET
Abs Immature Granulocytes: 0.03 10*3/uL (ref 0.00–0.07)
Basophils Absolute: 0 10*3/uL (ref 0.0–0.1)
Basophils Relative: 0 %
Eosinophils Absolute: 0.4 10*3/uL (ref 0.0–0.5)
Eosinophils Relative: 5 %
HCT: 39.1 % (ref 39.0–52.0)
Hemoglobin: 12.8 g/dL — ABNORMAL LOW (ref 13.0–17.0)
Immature Granulocytes: 0 %
Lymphocytes Relative: 33 %
Lymphs Abs: 2.7 10*3/uL (ref 0.7–4.0)
MCH: 31.8 pg (ref 26.0–34.0)
MCHC: 32.7 g/dL (ref 30.0–36.0)
MCV: 97 fL (ref 80.0–100.0)
Monocytes Absolute: 0.8 10*3/uL (ref 0.1–1.0)
Monocytes Relative: 9 %
Neutro Abs: 4.3 10*3/uL (ref 1.7–7.7)
Neutrophils Relative %: 53 %
Platelets: 183 10*3/uL (ref 150–400)
RBC: 4.03 MIL/uL — ABNORMAL LOW (ref 4.22–5.81)
RDW: 13.4 % (ref 11.5–15.5)
WBC: 8.2 10*3/uL (ref 4.0–10.5)
nRBC: 0 % (ref 0.0–0.2)

## 2020-02-14 SURGERY — LAPAROSCOPIC CHOLECYSTECTOMY WITH INTRAOPERATIVE CHOLANGIOGRAM
Anesthesia: General | Site: Abdomen

## 2020-02-14 MED ORDER — ROCURONIUM BROMIDE 10 MG/ML (PF) SYRINGE
PREFILLED_SYRINGE | INTRAVENOUS | Status: AC
  Filled 2020-02-14: qty 10

## 2020-02-14 MED ORDER — SUGAMMADEX SODIUM 200 MG/2ML IV SOLN
INTRAVENOUS | Status: DC | PRN
  Administered 2020-02-14: 200 mg via INTRAVENOUS

## 2020-02-14 MED ORDER — ONDANSETRON HCL 4 MG/2ML IJ SOLN
INTRAMUSCULAR | Status: DC | PRN
  Administered 2020-02-14: 4 mg via INTRAVENOUS

## 2020-02-14 MED ORDER — FENTANYL CITRATE (PF) 250 MCG/5ML IJ SOLN
INTRAMUSCULAR | Status: DC | PRN
  Administered 2020-02-14 (×2): 50 ug via INTRAVENOUS
  Administered 2020-02-14: 100 ug via INTRAVENOUS
  Administered 2020-02-14: 50 ug via INTRAVENOUS

## 2020-02-14 MED ORDER — ROCURONIUM BROMIDE 10 MG/ML (PF) SYRINGE
PREFILLED_SYRINGE | INTRAVENOUS | Status: DC | PRN
  Administered 2020-02-14: 50 mg via INTRAVENOUS

## 2020-02-14 MED ORDER — PROPOFOL 10 MG/ML IV BOLUS
INTRAVENOUS | Status: DC | PRN
  Administered 2020-02-14: 120 mg via INTRAVENOUS

## 2020-02-14 MED ORDER — LIDOCAINE 2% (20 MG/ML) 5 ML SYRINGE
INTRAMUSCULAR | Status: DC | PRN
  Administered 2020-02-14: 100 mg via INTRAVENOUS

## 2020-02-14 MED ORDER — LACTATED RINGERS IV SOLN: INTRAVENOUS | Status: DC

## 2020-02-14 MED ORDER — BUPIVACAINE HCL (PF) 0.25 % IJ SOLN
INTRAMUSCULAR | Status: DC | PRN
  Administered 2020-02-14: 13 mL

## 2020-02-14 MED ORDER — CHLORHEXIDINE GLUCONATE CLOTH 2 % EX PADS: 6.0000 | MEDICATED_PAD | Freq: Once | CUTANEOUS | Status: DC

## 2020-02-14 MED ORDER — OXYCODONE HCL 5 MG/5ML PO SOLN: 5.0000 mg | Freq: Once | ORAL | Status: DC | PRN

## 2020-02-14 MED ORDER — ONDANSETRON HCL 4 MG/2ML IJ SOLN
INTRAMUSCULAR | Status: AC
  Filled 2020-02-14: qty 2

## 2020-02-14 MED ORDER — CEFAZOLIN SODIUM-DEXTROSE 2-4 GM/100ML-% IV SOLN
2.0000 g | INTRAVENOUS | Status: AC
  Administered 2020-02-14: 2 g via INTRAVENOUS

## 2020-02-14 MED ORDER — LIDOCAINE 2% (20 MG/ML) 5 ML SYRINGE
INTRAMUSCULAR | Status: AC
  Filled 2020-02-14: qty 5

## 2020-02-14 MED ORDER — ACETAMINOPHEN 500 MG PO TABS: 1000.0000 mg | ORAL_TABLET | Freq: Once | ORAL | Status: DC | PRN

## 2020-02-14 MED ORDER — CHLORHEXIDINE GLUCONATE 0.12 % MT SOLN
OROMUCOSAL | Status: AC
  Administered 2020-02-14: 15 mL via OROMUCOSAL
  Filled 2020-02-14: qty 15

## 2020-02-14 MED ORDER — PROPOFOL 10 MG/ML IV BOLUS
INTRAVENOUS | Status: AC
  Filled 2020-02-14: qty 20

## 2020-02-14 MED ORDER — STERILE WATER FOR IRRIGATION IR SOLN
Status: DC | PRN
  Administered 2020-02-14: 1000 mL

## 2020-02-14 MED ORDER — CEFAZOLIN SODIUM-DEXTROSE 2-4 GM/100ML-% IV SOLN
INTRAVENOUS | Status: AC
  Filled 2020-02-14: qty 100

## 2020-02-14 MED ORDER — DEXMEDETOMIDINE HCL IN NACL 200 MCG/50ML IV SOLN
INTRAVENOUS | Status: DC | PRN
Start: 2020-02-14 — End: 2020-02-14
  Administered 2020-02-14: 50 ug via INTRAVENOUS

## 2020-02-14 MED ORDER — 0.9 % SODIUM CHLORIDE (POUR BTL) OPTIME
TOPICAL | Status: DC | PRN
  Administered 2020-02-14: 1000 mL

## 2020-02-14 MED ORDER — PHENYLEPHRINE HCL-NACL 10-0.9 MG/250ML-% IV SOLN
INTRAVENOUS | Status: DC | PRN
  Administered 2020-02-14: 25 ug/min via INTRAVENOUS

## 2020-02-14 MED ORDER — BUPIVACAINE HCL (PF) 0.25 % IJ SOLN
INTRAMUSCULAR | Status: AC
  Filled 2020-02-14: qty 30

## 2020-02-14 MED ORDER — PHENYLEPHRINE 40 MCG/ML (10ML) SYRINGE FOR IV PUSH (FOR BLOOD PRESSURE SUPPORT)
PREFILLED_SYRINGE | INTRAVENOUS | Status: DC | PRN
  Administered 2020-02-14: 120 ug via INTRAVENOUS

## 2020-02-14 MED ORDER — EPHEDRINE 5 MG/ML INJ
INTRAVENOUS | Status: AC
  Filled 2020-02-14: qty 10

## 2020-02-14 MED ORDER — FENTANYL CITRATE (PF) 250 MCG/5ML IJ SOLN
INTRAMUSCULAR | Status: AC
  Filled 2020-02-14: qty 5

## 2020-02-14 MED ORDER — PHENYLEPHRINE 40 MCG/ML (10ML) SYRINGE FOR IV PUSH (FOR BLOOD PRESSURE SUPPORT)
PREFILLED_SYRINGE | INTRAVENOUS | Status: AC
  Filled 2020-02-14: qty 10

## 2020-02-14 MED ORDER — ACETAMINOPHEN 10 MG/ML IV SOLN: 1000.0000 mg | Freq: Once | INTRAVENOUS | Status: DC | PRN

## 2020-02-14 MED ORDER — SODIUM CHLORIDE 0.9 % IV SOLN: INTRAVENOUS | Status: DC

## 2020-02-14 MED ORDER — ACETAMINOPHEN 160 MG/5ML PO SOLN: 1000.0000 mg | Freq: Once | ORAL | Status: DC | PRN

## 2020-02-14 MED ORDER — ACETAMINOPHEN 500 MG PO TABS
1000.0000 mg | ORAL_TABLET | Freq: Four times a day (QID) | ORAL | 2 refills | Status: DC | PRN
Start: 2020-02-14 — End: 2020-04-04

## 2020-02-14 MED ORDER — DEXMEDETOMIDINE HCL IN NACL 200 MCG/50ML IV SOLN
INTRAVENOUS | Status: AC
  Filled 2020-02-14: qty 50

## 2020-02-14 MED ORDER — OXYCODONE HCL 5 MG PO TABS: 5.0000 mg | ORAL_TABLET | Freq: Once | ORAL | Status: DC | PRN

## 2020-02-14 MED ORDER — SODIUM CHLORIDE 0.9 % IR SOLN
Status: DC | PRN
  Administered 2020-02-14: 1000 mL

## 2020-02-14 MED ORDER — ORAL CARE MOUTH RINSE: 15.0000 mL | Freq: Once | OROMUCOSAL | Status: AC

## 2020-02-14 MED ORDER — EPHEDRINE SULFATE-NACL 50-0.9 MG/10ML-% IV SOSY
PREFILLED_SYRINGE | INTRAVENOUS | Status: DC | PRN
  Administered 2020-02-14 (×2): 10 mg via INTRAVENOUS

## 2020-02-14 MED ORDER — KETAMINE HCL 50 MG/5ML IJ SOSY
PREFILLED_SYRINGE | INTRAMUSCULAR | Status: AC
  Filled 2020-02-14: qty 5

## 2020-02-14 MED ORDER — CHLORHEXIDINE GLUCONATE 0.12 % MT SOLN: 15.0000 mL | Freq: Once | OROMUCOSAL | Status: AC

## 2020-02-14 MED ORDER — FENTANYL CITRATE (PF) 100 MCG/2ML IJ SOLN: 25.0000 ug | INTRAMUSCULAR | Status: DC | PRN

## 2020-02-14 SURGICAL SUPPLY — 41 items
APPLIER CLIP ROT 10 11.4 M/L (STAPLE) ×4
BLADE CLIPPER SURG (BLADE) ×2 IMPLANT
CANISTER SUCT 3000ML PPV (MISCELLANEOUS) ×2 IMPLANT
CHLORAPREP W/TINT 26 (MISCELLANEOUS) ×2 IMPLANT
CLIP APPLIE ROT 10 11.4 M/L (STAPLE) ×2 IMPLANT
COVER MAYO STAND STRL (DRAPES) IMPLANT
COVER SURGICAL LIGHT HANDLE (MISCELLANEOUS) ×2 IMPLANT
COVER WAND RF STERILE (DRAPES) ×2 IMPLANT
DERMABOND ADVANCED (GAUZE/BANDAGES/DRESSINGS) ×1
DERMABOND ADVANCED .7 DNX12 (GAUZE/BANDAGES/DRESSINGS) ×1 IMPLANT
DRAPE C-ARM 42X120 X-RAY (DRAPES) ×2 IMPLANT
ELECT REM PT RETURN 9FT ADLT (ELECTROSURGICAL) ×2
ELECTRODE REM PT RTRN 9FT ADLT (ELECTROSURGICAL) ×1 IMPLANT
GLOVE BIO SURGEON STRL SZ8 (GLOVE) ×2 IMPLANT
GLOVE BIOGEL PI IND STRL 8 (GLOVE) ×1 IMPLANT
GLOVE BIOGEL PI INDICATOR 8 (GLOVE) ×1
GOWN STRL REUS W/ TWL LRG LVL3 (GOWN DISPOSABLE) ×3 IMPLANT
GOWN STRL REUS W/ TWL XL LVL3 (GOWN DISPOSABLE) ×1 IMPLANT
GOWN STRL REUS W/TWL LRG LVL3 (GOWN DISPOSABLE) ×3
GOWN STRL REUS W/TWL XL LVL3 (GOWN DISPOSABLE) ×1
KIT BASIN OR (CUSTOM PROCEDURE TRAY) ×2 IMPLANT
KIT TURNOVER KIT B (KITS) ×2 IMPLANT
NS IRRIG 1000ML POUR BTL (IV SOLUTION) ×2 IMPLANT
PAD ARMBOARD 7.5X6 YLW CONV (MISCELLANEOUS) ×2 IMPLANT
POUCH RETRIEVAL ECOSAC 10 (ENDOMECHANICALS) ×1 IMPLANT
POUCH RETRIEVAL ECOSAC 10MM (ENDOMECHANICALS) ×1
SCISSORS LAP 5X35 DISP (ENDOMECHANICALS) ×2 IMPLANT
SET CHOLANGIOGRAPH 5 50 .035 (SET/KITS/TRAYS/PACK) IMPLANT
SET IRRIG TUBING LAPAROSCOPIC (IRRIGATION / IRRIGATOR) ×2 IMPLANT
SET TUBE SMOKE EVAC HIGH FLOW (TUBING) ×2 IMPLANT
SLEEVE ENDOPATH XCEL 5M (ENDOMECHANICALS) ×2 IMPLANT
SUT MNCRL AB 4-0 PS2 18 (SUTURE) ×2 IMPLANT
SUT VIC AB 2-0 SH 27 (SUTURE) ×1
SUT VIC AB 2-0 SH 27XBRD (SUTURE) ×1 IMPLANT
TOWEL GREEN STERILE FF (TOWEL DISPOSABLE) ×2 IMPLANT
TRAY LAPAROSCOPIC MC (CUSTOM PROCEDURE TRAY) ×2 IMPLANT
TROCAR XCEL BLUNT TIP 100MML (ENDOMECHANICALS) ×2 IMPLANT
TROCAR XCEL NON-BLD 11X100MML (ENDOMECHANICALS) ×2 IMPLANT
TROCAR XCEL NON-BLD 5MMX100MML (ENDOMECHANICALS) ×2 IMPLANT
WARMER LAPAROSCOPE (MISCELLANEOUS) ×2 IMPLANT
WATER STERILE IRR 1000ML POUR (IV SOLUTION) ×2 IMPLANT

## 2020-02-14 NOTE — Discharge Instructions (Signed)
[facility] ______[facility] °LAPAROSCOPIC SURGERY: POST OP INSTRUCTIONS °Always review your discharge instruction sheet given to you by the facility where your surgery was performed. °IF YOU HAVE DISABILITY OR FAMILY LEAVE FORMS, YOU MUST BRING THEM TO THE OFFICE FOR PROCESSING.   °DO NOT GIVE THEM TO YOUR DOCTOR. ° °1. A prescription for pain medication may be given to you upon discharge.  Take your pain medication as prescribed, if needed.  If narcotic pain medicine is not needed, then you may take acetaminophen (Tylenol) or ibuprofen (Advil) as needed. °2. Take your usually prescribed medications unless otherwise directed. °3. If you need a refill on your pain medication, please contact your pharmacy.  They will contact our office to request authorization. Prescriptions will not be filled after 5pm or on week-ends. °4. You should follow a light diet the first few days after arrival home, such as soup and crackers, etc.  Be sure to include lots of fluids daily. °5. Most patients will experience some swelling and bruising in the area of the incisions.  Ice packs will help.  Swelling and bruising can take several days to resolve.  °6. It is common to experience some constipation if taking pain medication after surgery.  Increasing fluid intake and taking a stool softener (such as Colace) will usually help or prevent this problem from occurring.  A mild laxative (Milk of Magnesia or Miralax) should be taken according to package instructions if there are no bowel movements after 48 hours. °7. Unless discharge instructions indicate otherwise, you may remove your bandages 24-48 hours after surgery, and you may shower at that time.  You may have steri-strips (small skin tapes) in place directly over the incision.  These strips should be left on the skin for 7-10 days.  If your surgeon used skin glue on the incision, you may shower in 24 hours.  The glue will flake off over the next 2-3 weeks.  Any sutures or  staples will be removed at the office during your follow-up visit. °8. ACTIVITIES:  You may resume regular (light) daily activities beginning the next day--such as daily self-care, walking, climbing stairs--gradually increasing activities as tolerated.  You may have sexual intercourse when it is comfortable.  Refrain from any heavy lifting or straining until approved by your doctor. °a. You may drive when you are no longer taking prescription pain medication, you can comfortably wear a seatbelt, and you can safely maneuver your car and apply brakes. °b. RETURN TO WORK:  __________________________________________________________ °9. You should see your doctor in the office for a follow-up appointment approximately 2-3 weeks after your surgery.  Make sure that you call for this appointment within a day or two after you arrive home to insure a convenient appointment time. °10. OTHER INSTRUCTIONS: __________________________________________________________________________________________________________________________ __________________________________________________________________________________________________________________________ °WHEN TO CALL YOUR DOCTOR: °1. Fever over 101.0 °2. Inability to urinate °3. Continued bleeding from incision. °4. Increased pain, redness, or drainage from the incision. °5. Increasing abdominal pain ° °The clinic staff is available to answer your questions during regular business hours.  Please don’t hesitate to call and ask to speak to one of the nurses for clinical concerns.  If you have a medical emergency, go to the nearest emergency room or call 911.  A surgeon from [facility] is always on call at the hospital. °[address], [address], [place], [place]  [address] ? [address] [address], [place],    [address] °[phone] ? [phone] ? FAX [phone] °Web site:   [email or web address] °

## 2020-02-14 NOTE — Interval H&P Note (Signed)
History and Physical Interval Note:  02/14/2020 2:49 PM  Kenneth Brandt  has presented today for surgery, with the diagnosis of gallstones.  The various methods of treatment have been discussed with the patient and family. After consideration of risks, benefits and other options for treatment, the patient has consented to  Procedure(s): LAPAROSCOPIC CHOLECYSTECTOMY WITH POSSIBLE INTRAOPERATIVE CHOLANGIOGRAM (N/A) as a surgical intervention.  The patient's history has been reviewed, patient examined, no change in status, stable for surgery.  I have reviewed the patient's chart and labs.  Questions were answered to the patient's satisfaction.     Thomas A Cornett

## 2020-02-14 NOTE — Transfer of Care (Signed)
Immediate Anesthesia Transfer of Care Note  Patient: Kenneth Brandt  Procedure(s) Performed: LAPAROSCOPIC CHOLECYSTECTOMY (N/A Abdomen)  Patient Location: PACU  Anesthesia Type:General  Level of Consciousness: awake, alert  and oriented  Airway & Oxygen Therapy: Patient Spontanous Breathing and Patient connected to face mask oxygen  Post-op Assessment: Report given to RN and Post -op Vital signs reviewed and stable  Post vital signs: Reviewed and stable  Last Vitals:  Vitals Value Taken Time  BP 131/64 02/14/20 1629  Temp    Pulse 75 02/14/20 1632  Resp 21 02/14/20 1632  SpO2 100 % 02/14/20 1632  Vitals shown include unvalidated device data.  Last Pain:  Vitals:   02/14/20 1259  TempSrc:   PainSc: 6       Patients Stated Pain Goal: 3 (02/14/20 1259)  Complications: No complications documented.

## 2020-02-14 NOTE — Anesthesia Preprocedure Evaluation (Addendum)
Anesthesia Evaluation  Patient identified by MRN, date of birth, ID band Patient awake    Reviewed: Allergy & Precautions, NPO status , Patient's Chart, lab work & pertinent test results  History of Anesthesia Complications Negative for: history of anesthetic complications  Airway Mallampati: II  TM Distance: >3 FB Neck ROM: Full    Dental  (+) Dental Advisory Given   Pulmonary    breath sounds clear to auscultation       Cardiovascular hypertension, Pt. on medications + Peripheral Vascular Disease   Rhythm:Regular  - Left ventricle: The cavity size was normal. Wall thickness was  increased in a pattern of mild LVH. Systolic function was normal.  The estimated ejection fraction was in the range of 60% to 65%.  Wall motion was normal; there were no regional wall motion  abnormalities. Doppler parameters are consistent with abnormal  left ventricular relaxation (grade 1 diastolic dysfunction).  - Aortic valve: There was no stenosis.  - Mitral valve: There was no significant regurgitation.  - Right ventricle: The cavity size was normal. Systolic function  was normal.  - Tricuspid valve: Peak RV-RA gradient (S): 31 mm Hg.  - Pulmonary arteries: PA peak pressure: 34 mm Hg (S).  - Inferior vena cava: The vessel was normal in size. The  respirophasic diameter changes were in the normal range (>= 50%),  consistent with normal central venous pressure.    Neuro/Psych PSYCHIATRIC DISORDERS Anxiety Depression Dementia Parkinson's Neuromuscular disease    GI/Hepatic GERD  ,(+) Hepatitis -  Endo/Other  Hypothyroidism   Renal/GU      Musculoskeletal  (+) Arthritis ,   Abdominal   Peds  Hematology   Anesthesia Other Findings   Reproductive/Obstetrics                            Anesthesia Physical Anesthesia Plan  ASA: III  Anesthesia Plan: General   Post-op Pain Management:     Induction: Intravenous  PONV Risk Score and Plan: 2 and Ondansetron and Dexamethasone  Airway Management Planned: Oral ETT  Additional Equipment: None  Intra-op Plan:   Post-operative Plan: Extubation in OR  Informed Consent: I have reviewed the patients History and Physical, chart, labs and discussed the procedure including the risks, benefits and alternatives for the proposed anesthesia with the patient or authorized representative who has indicated his/her understanding and acceptance.     Dental advisory given  Plan Discussed with: CRNA and Surgeon  Anesthesia Plan Comments:         Anesthesia Quick Evaluation

## 2020-02-14 NOTE — Op Note (Signed)
Laparoscopic Cholecystectomy  Procedure Note  Indications: This patient presents with symptomatic gallbladder disease and will undergo laparoscopic cholecystectomy. The procedure has been discussed with the patient. Operative and non operative treatments have been discussed. Risks of surgery include bleeding, infection,  Common bile duct injury,  Injury to the stomach,liver, colon,small intestine, abdominal wall,  Diaphragm,  Major blood vessels,  And the need for an open procedure.  Other risks include worsening of medical problems, death,  DVT and pulmonary embolism, and cardiovascular events.   Medical options have also been discussed. The patient has been informed of long term expectations of surgery and non surgical options,  The patient agrees to proceed.    Pre-operative Diagnosis: Calculus of gallbladder with acute cholecystitis, without mention of obstruction  Post-operative Diagnosis: Same  Surgeon: Turner Daniels  MD   Assistants: Gaynell Face RNFA   Anesthesia: General endotracheal anesthesia and Local anesthesia 0.25.% bupivacaine, with epinephrine  ASA Class: 2  Procedure Details  The patient was seen again in the Holding Room. The risks, benefits, complications, treatment options, and expected outcomes were discussed with the patient. The possibilities of reaction to medication, pulmonary aspiration, perforation of viscus, bleeding, recurrent infection, finding a normal gallbladder, the need for additional procedures, failure to diagnose a condition, the possible need to convert to an open procedure, and creating a complication requiring transfusion or operation were discussed with the patient. The patient and/or family concurred with the proposed plan, giving informed consent. The site of surgery properly noted/marked. The patient was taken to Operating Room, identified as Kenneth Brandt and the procedure verified as Laparoscopic Cholecystectomy with Intraoperative Cholangiograms. A  Time Out was held and the above information confirmed.  Prior to the induction of general anesthesia, antibiotic prophylaxis was administered. General endotracheal anesthesia was then administered and tolerated well. After the induction, the abdomen was prepped in the usual sterile fashion. The patient was positioned in the supine position with the left arm comfortably tucked, along with some reverse Trendelenburg.  Local anesthetic agent was injected into the skin near the umbilicus and an incision made. The midline fascia was incised and the Hasson technique was used to introduce a 12 mm port under direct vision. It was secured with a figure of eight Vicryl suture placed in the usual fashion. Pneumoperitoneum was then created with CO2 and tolerated well without any adverse changes in the patient's vital signs. Additional trocars were introduced under direct vision with an 11 mm trocar in the epigastrium and two  5 mm trocars in the right upper quadrant. All skin incisions were infiltrated with a local anesthetic agent before making the incision and placing the trocars.   The gallbladder was identified, the fundus grasped and retracted cephalad. Adhesions were lysed bluntly and with the electrocautery where indicated, taking care not to injure any adjacent organs or viscus. The infundibulum was grasped and retracted laterally, exposing the peritoneum overlying the triangle of Calot. This was then divided and exposed in a blunt fashion. The cystic duct was clearly identified and bluntly dissected circumferentially. The junctions of the gallbladder, cystic duct and common bile duct were clearly identified prior to the division of any linear structure.   The critical view was obtained.  The cystic duct was quite small and wound not accommodate a catheter.   The cystic duct was then  ligated with surgical clips  on the patient side and  clipped on the gallbladder side and divided. The cystic artery was  identified, dissected free, ligated  with clips and divided as well. Posterior cystic artery clipped and divided.  The gallbladder was dissected from the liver bed in retrograde fashion with the electrocautery. The gallbladder was removed. The liver bed was irrigated and inspected. Hemostasis was achieved with the electrocautery. Copious irrigation was utilized and was repeatedly aspirated until clear all particulate matter. Hemostasis was achieved with no signs  Of bleeding or bile leakage.  Pneumoperitoneum was completely reduced after viewing removal of the trocars under direct vision. The wound was thoroughly irrigated and the fascia was then closed with a figure of eight suture; the skin was then closed with 4 0  and a sterile dressing was applied.  Instrument, sponge, and needle counts were correct at closure and at the conclusion of the case.   Findings:   Cholelithiasis  Estimated Blood Loss: Minimal         Drains: none          Total IV Fluids: per record         Specimens: Gallbladder           Complications: None; patient tolerated the procedure well.         Disposition: PACU - hemodynamically stable.         Condition: stable

## 2020-02-14 NOTE — Anesthesia Procedure Notes (Signed)
Procedure Name: Intubation Date/Time: 02/14/2020 2:59 PM Performed by: Mayer Camel, CRNA Pre-anesthesia Checklist: Patient identified, Emergency Drugs available, Suction available and Patient being monitored Patient Re-evaluated:Patient Re-evaluated prior to induction Oxygen Delivery Method: Circle System Utilized Preoxygenation: Pre-oxygenation with 100% oxygen Induction Type: IV induction Ventilation: Mask ventilation without difficulty Laryngoscope Size: Miller and 2 Tube type: Oral Tube size: 7.5 mm Number of attempts: 1 Airway Equipment and Method: Stylet Placement Confirmation: ETT inserted through vocal cords under direct vision,  positive ETCO2 and breath sounds checked- equal and bilateral Secured at: 22 cm Tube secured with: Tape Dental Injury: Teeth and Oropharynx as per pre-operative assessment

## 2020-02-16 NOTE — Anesthesia Postprocedure Evaluation (Signed)
Anesthesia Post Note  Patient: Kenneth Brandt  Procedure(s) Performed: LAPAROSCOPIC CHOLECYSTECTOMY (N/A Abdomen)     Patient location during evaluation: PACU Anesthesia Type: General Level of consciousness: patient cooperative and awake Pain management: pain level controlled Vital Signs Assessment: post-procedure vital signs reviewed and stable Respiratory status: spontaneous breathing, nonlabored ventilation, respiratory function stable and patient connected to nasal cannula oxygen Cardiovascular status: blood pressure returned to baseline and stable Postop Assessment: no apparent nausea or vomiting Anesthetic complications: no   No complications documented.  Last Vitals:  Vitals:   02/14/20 1744 02/14/20 1745  BP: 98/63   Pulse: 60   Resp: 12   Temp:  (!) 36.3 C  SpO2: 98%     Last Pain:  Vitals:   02/14/20 1745  TempSrc:   PainSc: 0-No pain                 Ivan Lacher

## 2020-02-18 LAB — SURGICAL PATHOLOGY

## 2020-04-04 ENCOUNTER — Other Ambulatory Visit: Payer: Self-pay

## 2020-04-04 ENCOUNTER — Encounter: Payer: Self-pay | Admitting: Podiatry

## 2020-04-04 ENCOUNTER — Ambulatory Visit (INDEPENDENT_AMBULATORY_CARE_PROVIDER_SITE_OTHER): Payer: Medicare (Managed Care) | Admitting: Podiatry

## 2020-04-04 DIAGNOSIS — Q828 Other specified congenital malformations of skin: Secondary | ICD-10-CM | POA: Insufficient documentation

## 2020-04-04 DIAGNOSIS — M1712 Unilateral primary osteoarthritis, left knee: Secondary | ICD-10-CM | POA: Diagnosis not present

## 2020-04-04 DIAGNOSIS — B351 Tinea unguium: Secondary | ICD-10-CM | POA: Diagnosis not present

## 2020-04-04 DIAGNOSIS — M79675 Pain in left toe(s): Secondary | ICD-10-CM | POA: Diagnosis not present

## 2020-04-04 DIAGNOSIS — M79674 Pain in right toe(s): Secondary | ICD-10-CM | POA: Diagnosis not present

## 2020-04-04 NOTE — Progress Notes (Addendum)
This patient returns to my office for at risk foot care.  This patient requires this care by a professional since this patient will be at risk due to having  PAD and dementia.  This patient is unable to cut nails himself since the patient cannot reach his nails.These nails are painful walking and wearing shoes.  This patient presents for at risk foot care today.  General Appearance  Alert, conversant and in no acute stress.  Vascular  Dorsalis pedis and posterior tibial  pulses are palpable  bilaterally.  Capillary return is within normal limits  bilaterally. Temperature is within normal limits  bilaterally.  Neurologic  Senn-Weinstein monofilament wire test within normal limits  bilaterally. Muscle power within normal limits bilaterally.  Nails Thick disfigured discolored nails with subungual debris  Hallux nails  bilaterally. No evidence of bacterial infection or drainage bilaterally.  Orthopedic  No limitations of motion  feet .  No crepitus or effusions noted.  No bony pathology or digital deformities noted.  Skin  normotropic skin with no porokeratosis noted bilaterally.  No signs of infections or ulcers noted.   Symptomatic porokeratosis left foot.  Onychomycosis  Pain in right toes  Pain in left toes  Consent was obtained for treatment procedures.   Mechanical debridement of hallux nails  bilaterally performed with a nail nipper.  Filed with dremel without incident.  Debride porokeratosis left foot with # 15 blade.   Return office visit   prn                  Told patient to return for periodic foot care and evaluation due to potential at risk complications.   Helane Gunther DPM

## 2020-08-02 ENCOUNTER — Inpatient Hospital Stay (HOSPITAL_BASED_OUTPATIENT_CLINIC_OR_DEPARTMENT_OTHER)
Admission: EM | Admit: 2020-08-02 | Discharge: 2020-08-04 | DRG: 193 | Disposition: A | Discharge status: Home Health | Payer: Medicare (Managed Care) | Attending: Internal Medicine | Admitting: Internal Medicine

## 2020-08-02 ENCOUNTER — Emergency Department (HOSPITAL_BASED_OUTPATIENT_CLINIC_OR_DEPARTMENT_OTHER): Payer: Medicare (Managed Care)

## 2020-08-02 ENCOUNTER — Other Ambulatory Visit: Payer: Self-pay

## 2020-08-02 ENCOUNTER — Encounter (HOSPITAL_BASED_OUTPATIENT_CLINIC_OR_DEPARTMENT_OTHER): Payer: Self-pay

## 2020-08-02 DIAGNOSIS — R531 Weakness: Secondary | ICD-10-CM

## 2020-08-02 DIAGNOSIS — J9601 Acute respiratory failure with hypoxia: Secondary | ICD-10-CM | POA: Diagnosis present

## 2020-08-02 DIAGNOSIS — I739 Peripheral vascular disease, unspecified: Secondary | ICD-10-CM | POA: Diagnosis present

## 2020-08-02 DIAGNOSIS — I10 Essential (primary) hypertension: Secondary | ICD-10-CM | POA: Diagnosis present

## 2020-08-02 DIAGNOSIS — E785 Hyperlipidemia, unspecified: Secondary | ICD-10-CM | POA: Diagnosis present

## 2020-08-02 DIAGNOSIS — R52 Pain, unspecified: Secondary | ICD-10-CM | POA: Diagnosis not present

## 2020-08-02 DIAGNOSIS — G20 Parkinson's disease: Secondary | ICD-10-CM | POA: Diagnosis present

## 2020-08-02 DIAGNOSIS — M545 Low back pain, unspecified: Secondary | ICD-10-CM | POA: Diagnosis present

## 2020-08-02 DIAGNOSIS — G894 Chronic pain syndrome: Secondary | ICD-10-CM | POA: Diagnosis present

## 2020-08-02 DIAGNOSIS — E871 Hypo-osmolality and hyponatremia: Secondary | ICD-10-CM | POA: Diagnosis present

## 2020-08-02 DIAGNOSIS — Z79890 Hormone replacement therapy (postmenopausal): Secondary | ICD-10-CM

## 2020-08-02 DIAGNOSIS — G8929 Other chronic pain: Secondary | ICD-10-CM | POA: Diagnosis present

## 2020-08-02 DIAGNOSIS — E7800 Pure hypercholesterolemia, unspecified: Secondary | ICD-10-CM | POA: Diagnosis present

## 2020-08-02 DIAGNOSIS — J189 Pneumonia, unspecified organism: Secondary | ICD-10-CM | POA: Diagnosis not present

## 2020-08-02 DIAGNOSIS — E739 Lactose intolerance, unspecified: Secondary | ICD-10-CM | POA: Diagnosis present

## 2020-08-02 DIAGNOSIS — Z7982 Long term (current) use of aspirin: Secondary | ICD-10-CM

## 2020-08-02 DIAGNOSIS — F0280 Dementia in other diseases classified elsewhere without behavioral disturbance: Secondary | ICD-10-CM | POA: Diagnosis present

## 2020-08-02 DIAGNOSIS — F03918 Unspecified dementia, unspecified severity, with other behavioral disturbance: Secondary | ICD-10-CM | POA: Diagnosis present

## 2020-08-02 DIAGNOSIS — N179 Acute kidney failure, unspecified: Secondary | ICD-10-CM | POA: Diagnosis present

## 2020-08-02 DIAGNOSIS — F0391 Unspecified dementia with behavioral disturbance: Secondary | ICD-10-CM | POA: Diagnosis present

## 2020-08-02 DIAGNOSIS — Z79899 Other long term (current) drug therapy: Secondary | ICD-10-CM

## 2020-08-02 DIAGNOSIS — M1990 Unspecified osteoarthritis, unspecified site: Secondary | ICD-10-CM | POA: Diagnosis present

## 2020-08-02 DIAGNOSIS — E039 Hypothyroidism, unspecified: Secondary | ICD-10-CM | POA: Diagnosis present

## 2020-08-02 DIAGNOSIS — Z20822 Contact with and (suspected) exposure to covid-19: Secondary | ICD-10-CM | POA: Diagnosis present

## 2020-08-02 DIAGNOSIS — Z972 Presence of dental prosthetic device (complete) (partial): Secondary | ICD-10-CM

## 2020-08-02 DIAGNOSIS — R0902 Hypoxemia: Secondary | ICD-10-CM

## 2020-08-02 DIAGNOSIS — E559 Vitamin D deficiency, unspecified: Secondary | ICD-10-CM | POA: Diagnosis present

## 2020-08-02 DIAGNOSIS — Z8249 Family history of ischemic heart disease and other diseases of the circulatory system: Secondary | ICD-10-CM

## 2020-08-02 DIAGNOSIS — K219 Gastro-esophageal reflux disease without esophagitis: Secondary | ICD-10-CM | POA: Diagnosis present

## 2020-08-02 HISTORY — DX: Essential tremor: G25.0

## 2020-08-02 LAB — URINALYSIS, MICROSCOPIC (REFLEX)

## 2020-08-02 LAB — BASIC METABOLIC PANEL
Anion gap: 9 (ref 5–15)
BUN: 25 mg/dL — ABNORMAL HIGH (ref 8–23)
CO2: 26 mmol/L (ref 22–32)
Calcium: 8.7 mg/dL — ABNORMAL LOW (ref 8.9–10.3)
Chloride: 99 mmol/L (ref 98–111)
Creatinine, Ser: 1.57 mg/dL — ABNORMAL HIGH (ref 0.61–1.24)
GFR, Estimated: 43 mL/min — ABNORMAL LOW (ref >60)
Glucose, Bld: 123 mg/dL — ABNORMAL HIGH (ref 70–99)
Potassium: 3.7 mmol/L (ref 3.5–5.1)
Sodium: 134 mmol/L — ABNORMAL LOW (ref 135–145)

## 2020-08-02 LAB — I-STAT VENOUS BLOOD GAS, ED
Acid-Base Excess: 3 mmol/L — ABNORMAL HIGH (ref 0.0–2.0)
Bicarbonate: 29 mmol/L — ABNORMAL HIGH (ref 20.0–28.0)
Calcium, Ion: 1.21 mmol/L (ref 1.15–1.40)
HCT: 31 % — ABNORMAL LOW (ref 39.0–52.0)
Hemoglobin: 10.5 g/dL — ABNORMAL LOW (ref 13.0–17.0)
O2 Saturation: 45 %
Potassium: 4.3 mmol/L (ref 3.5–5.1)
Sodium: 132 mmol/L — ABNORMAL LOW (ref 135–145)
TCO2: 30 mmol/L (ref 22–32)
pCO2, Ven: 47.9 mmHg (ref 44.0–60.0)
pH, Ven: 7.39 (ref 7.250–7.430)
pO2, Ven: 25 mmHg — CL (ref 32.0–45.0)

## 2020-08-02 LAB — CBC
HCT: 38.4 % — ABNORMAL LOW (ref 39.0–52.0)
Hemoglobin: 13.1 g/dL (ref 13.0–17.0)
MCH: 33.8 pg (ref 26.0–34.0)
MCHC: 34.1 g/dL (ref 30.0–36.0)
MCV: 99 fL (ref 80.0–100.0)
Platelets: 179 10*3/uL (ref 150–400)
RBC: 3.88 MIL/uL — ABNORMAL LOW (ref 4.22–5.81)
RDW: 14.7 % (ref 11.5–15.5)
WBC: 12.3 10*3/uL — ABNORMAL HIGH (ref 4.0–10.5)
nRBC: 0 % (ref 0.0–0.2)

## 2020-08-02 LAB — HEPATIC FUNCTION PANEL
ALT: 29 U/L (ref 0–44)
AST: 55 U/L — ABNORMAL HIGH (ref 15–41)
Albumin: 3.4 g/dL — ABNORMAL LOW (ref 3.5–5.0)
Alkaline Phosphatase: 77 U/L (ref 38–126)
Bilirubin, Direct: 0.2 mg/dL (ref 0.0–0.2)
Indirect Bilirubin: 0.8 mg/dL (ref 0.3–0.9)
Total Bilirubin: 1 mg/dL (ref 0.3–1.2)
Total Protein: 6.4 g/dL — ABNORMAL LOW (ref 6.5–8.1)

## 2020-08-02 LAB — URINALYSIS, ROUTINE W REFLEX MICROSCOPIC
Bilirubin Urine: NEGATIVE
Glucose, UA: NEGATIVE mg/dL
Ketones, ur: NEGATIVE mg/dL
Leukocytes,Ua: NEGATIVE
Nitrite: NEGATIVE
Protein, ur: NEGATIVE mg/dL
Specific Gravity, Urine: 1.015 (ref 1.005–1.030)
pH: 5.5 (ref 5.0–8.0)

## 2020-08-02 LAB — BRAIN NATRIURETIC PEPTIDE: B Natriuretic Peptide: 44.8 pg/mL (ref 0.0–100.0)

## 2020-08-02 LAB — LIPASE, BLOOD: Lipase: 17 U/L (ref 11–51)

## 2020-08-02 LAB — TSH: TSH: 0.75 u[IU]/mL (ref 0.350–4.500)

## 2020-08-02 LAB — RESP PANEL BY RT-PCR (FLU A&B, COVID) ARPGX2
Influenza A by PCR: NEGATIVE
Influenza B by PCR: NEGATIVE
SARS Coronavirus 2 by RT PCR: NEGATIVE

## 2020-08-02 LAB — LACTIC ACID, PLASMA: Lactic Acid, Venous: 1.4 mmol/L (ref 0.5–1.9)

## 2020-08-02 MED ORDER — LEVOTHYROXINE SODIUM 75 MCG PO TABS
75.0000 ug | ORAL_TABLET | Freq: Every day | ORAL | Status: DC
  Administered 2020-08-03 – 2020-08-04 (×2): 75 ug via ORAL
  Filled 2020-08-02 (×2): qty 1

## 2020-08-02 MED ORDER — ATORVASTATIN CALCIUM 20 MG PO TABS
20.0000 mg | ORAL_TABLET | Freq: Every day | ORAL | Status: DC
  Administered 2020-08-03 – 2020-08-04 (×2): 20 mg via ORAL
  Filled 2020-08-02 (×2): qty 1

## 2020-08-02 MED ORDER — DULOXETINE HCL 60 MG PO CPEP
60.0000 mg | ORAL_CAPSULE | Freq: Every day | ORAL | Status: DC
  Administered 2020-08-03 – 2020-08-04 (×2): 60 mg via ORAL
  Filled 2020-08-02 (×3): qty 1

## 2020-08-02 MED ORDER — IOHEXOL 300 MG/ML  SOLN
100.0000 mL | Freq: Once | INTRAMUSCULAR | Status: AC
  Administered 2020-08-02: 80 mL via INTRAVENOUS

## 2020-08-02 MED ORDER — ACETAMINOPHEN 325 MG PO TABS
650.0000 mg | ORAL_TABLET | Freq: Once | ORAL | Status: AC
  Administered 2020-08-02: 650 mg via ORAL
  Filled 2020-08-02: qty 2

## 2020-08-02 MED ORDER — SODIUM CHLORIDE 0.9 % IV SOLN
1.0000 g | Freq: Once | INTRAVENOUS | Status: AC
  Administered 2020-08-02: 1 g via INTRAVENOUS
  Filled 2020-08-02: qty 10

## 2020-08-02 MED ORDER — LORAZEPAM 0.5 MG PO TABS
0.5000 mg | ORAL_TABLET | Freq: Every day | ORAL | Status: DC
  Administered 2020-08-02 – 2020-08-03 (×2): 0.5 mg via ORAL
  Filled 2020-08-02 (×2): qty 1

## 2020-08-02 MED ORDER — SODIUM CHLORIDE 0.9 % IV SOLN
500.0000 mg | Freq: Once | INTRAVENOUS | Status: AC
  Administered 2020-08-02: 500 mg via INTRAVENOUS
  Filled 2020-08-02: qty 500

## 2020-08-02 MED ORDER — PREGABALIN 75 MG PO CAPS: 75.0000 mg | ORAL_CAPSULE | Freq: Every day | ORAL | Status: DC

## 2020-08-02 MED ORDER — PANTOPRAZOLE SODIUM 40 MG PO TBEC
40.0000 mg | DELAYED_RELEASE_TABLET | Freq: Every day | ORAL | Status: DC
  Administered 2020-08-03 – 2020-08-04 (×2): 40 mg via ORAL
  Filled 2020-08-02 (×2): qty 1

## 2020-08-02 MED ORDER — ASPIRIN EC 81 MG PO TBEC
81.0000 mg | DELAYED_RELEASE_TABLET | Freq: Every day | ORAL | Status: DC
  Administered 2020-08-03 – 2020-08-04 (×2): 81 mg via ORAL
  Filled 2020-08-02 (×2): qty 1

## 2020-08-02 MED ORDER — LACTATED RINGERS IV BOLUS
500.0000 mL | Freq: Once | INTRAVENOUS | Status: AC
  Administered 2020-08-02: 500 mL via INTRAVENOUS

## 2020-08-02 MED ORDER — SODIUM CHLORIDE 0.9 % IV SOLN: INTRAVENOUS | Status: DC

## 2020-08-02 NOTE — Progress Notes (Signed)
Received page to transfer patient to  for acute respiratory failure secondary to pneumonia. Came in for weakness and satting 85% on room air. Found to have pneumonia. covid and flu negative.  Requiring 2L of oxygen, but maintaining saturation well. Also AKI. Given IVF bolus and starting on azithromycin. Lactic acid wnl. CT scan shows right middle lobe pneumonia.  Will be transferring to [facility] for admit by hospitalist team on arrival.  Vitals stable and patient stable. Labs and imaging reviewed.   Orland Mustard, MD [facility].

## 2020-08-02 NOTE — ED Provider Notes (Signed)
[facility] EMERGENCY DEPARTMENT Provider Note   CSN: [id number] Arrival date & time: 08/02/20  1244     History Chief Complaint  Patient presents with  . Fatigue    Kenneth Brandt is a 85 y.o. male.  HPI      Kenneth Brandt is a 85 y.o. male, with a history of dementia, GERD, HTN, hypercholesterolemia, Parkinson's, PAD, presenting to the ED with complaint of generalized weakness for the last several days.  He states he has not felt himself for about 12 days, but today he was unable to get out of bed. He also endorses poor appetite and poor oral intake.    He lives at home with his wife and typically has no difficulty ambulating.   He has been vaccinated against Covid.  He states his last bowel movement was about 3 days ago, which is unusual for him as he typically has a bowel movement daily. Denies known fever, shortness of breath, cough, chest pain, abdominal pain, urinary symptoms, acute lower extremity edema, rashes/wounds, syncope, falls, trauma, or any other complaints.    Past Medical History:  Diagnosis Date  . Anxiety   . Arthritis   . Chronic back pain   . Dementia (HCC)   . Depression   . Dyspnea    occasionally  . GERD (gastroesophageal reflux disease)   . Hepatitis    A -treated  . HSV infection   . Hypercholesteremia   . Hypertension   . Hypothyroid   . Neuromuscular disorder (HCC)   . PAD (peripheral artery disease) (HCC)   . Parkinson disease (HCC)   . Seasonal allergies   . Wears glasses   . Wears partial dentures    upper and lower    Patient Active Problem List   Diagnosis Date Noted  . CAP (community acquired pneumonia) 08/02/2020  . Pain due to onychomycosis of toenails of both feet 04/04/2020  . Porokeratosis 04/04/2020  . Primary osteoarthritis of left knee 02/22/2017  . Hyperlipidemia 02/01/2017  . Vitamin D deficiency 02/01/2017  . Peripheral artery disease (HCC) 02/01/2017  . Chronic diarrhea 02/01/2017  .  Hypothyroidism 02/01/2017  . Chronic low back pain 02/01/2017  . Chronic pain 02/01/2017  . Facial cellulitis 02/08/2016  . Dental abscess 02/08/2016  . Dementia with behavioral disturbance (HCC) 02/08/2016  . Parkinson disease (HCC) 02/08/2016  . Essential hypertension 02/08/2016    Past Surgical History:  Procedure Laterality Date  . ANKLE FRACTURE SURGERY Right   . APPENDECTOMY     remote  . BACK SURGERY  2009, 2011   laminectomies  . BIOPSY  11/21/2019   Procedure: BIOPSY;  Surgeon: Graylin Shiver, MD;  Location: [facility];  Service: Endoscopy;;  . CARDIOVASCULAR STRESS TEST     04/25/15 Nuclear stress test [facility]): No definite evidence for inducible ischemia or infarct, EF 74%  . CATARACT EXTRACTION Bilateral   . CHOLECYSTECTOMY N/A 02/14/2020   Procedure: LAPAROSCOPIC CHOLECYSTECTOMY;  Surgeon: Harriette Bouillon, MD;  Location: [facility] OR;  Service: General;  Laterality: N/A;  . ESOPHAGOGASTRODUODENOSCOPY (EGD) WITH PROPOFOL N/A 11/21/2019   Procedure: ESOPHAGOGASTRODUODENOSCOPY (EGD) WITH PROPOFOL;  Surgeon: Graylin Shiver, MD;  Location: [facility];  Service: Endoscopy;  Laterality: N/A;  . IR KYPHO LUMBAR INC FX REDUCE BONE BX UNI/BIL CANNULATION INC/IMAGING  03/14/2018  . IR SACROPLASTY BILATERAL  08/18/2018  . REPLACEMENT TOTAL KNEE Right 2012  . RETINAL DETACHMENT SURGERY Right   . TONSILLECTOMY    . TOTAL KNEE ARTHROPLASTY Left  02/22/2017  . TOTAL KNEE ARTHROPLASTY Left 02/22/2017   Procedure: LEFT TOTAL KNEE ARTHROPLASTY;  Surgeon: Sheral Apley, MD;  Location: [facility];  Service: Orthopedics;  Laterality: Left;       Family History  Problem Relation Age of Onset  . Cancer Mother   . Pneumonia Mother   . CAD Father   . CAD Brother   . Cancer Other   . Stroke Neg Hx     Social History   Tobacco Use  . Smoking status: Never Smoker  . Smokeless tobacco: Never Used  Vaping Use  . Vaping Use: Never used  Substance Use Topics  . Alcohol use: No  .  Drug use: No    Home Medications Prior to Admission medications   Medication Sig Start Date End Date Taking? Authorizing Provider  aspirin EC 81 MG tablet Take 81 mg by mouth daily. Swallow whole.    [provider]  atorvastatin (LIPITOR) 20 MG tablet Take 20 mg by mouth daily.    [provider]  DULoxetine (CYMBALTA) 60 MG capsule Take 60 mg by mouth daily.     [provider]  ibandronate (BONIVA) 150 MG tablet Take 150 mg by mouth every 30 (thirty) days. Take in the morning with a full glass of water, on an empty stomach, and do not take anything else by mouth or lie down for the next 30 min.    [provider]  levothyroxine (SYNTHROID, LEVOTHROID) 75 MCG tablet Take 75 mcg by mouth daily before breakfast.    [provider]  LORazepam (ATIVAN) 0.5 MG tablet Take 0.5 mg by mouth at bedtime. sleep    [provider]  losartan (COZAAR) 100 MG tablet Take 100 mg by mouth daily.     [provider]  morphine (MS CONTIN) 30 MG 12 hr tablet Take 30 mg by mouth every 12 (twelve) hours.    [provider]  morphine (MSIR) 15 MG tablet Take 15 mg by mouth every 4 (four) hours as needed for severe pain.    [provider]  pantoprazole (PROTONIX) 40 MG tablet Take 40 mg by mouth daily.    [provider]  pregabalin (LYRICA) 75 MG capsule Take by mouth.    [provider]  torsemide (DEMADEX) 20 MG tablet Take by mouth.    [provider]  traZODone (DESYREL) 100 MG tablet Take by mouth.    [provider]  Vitamin D, Cholecalciferol, 10 MCG (400 UNIT) CAPS Take 400 Units by mouth daily.    [provider]    Allergies    Lactose intolerance (gi)  Review of Systems   Review of Systems  Constitutional: Negative for chills and fever.  Respiratory: Negative for cough and shortness of breath.   Cardiovascular: Negative for chest pain.  Gastrointestinal: Negative for  abdominal pain, blood in stool, diarrhea, nausea and vomiting.  Genitourinary: Negative for dysuria, flank pain, hematuria, penile swelling, scrotal swelling and testicular pain.  Musculoskeletal: Negative for back pain, neck pain and neck stiffness.  Skin: Negative for rash and wound.  Neurological: Positive for weakness. Negative for dizziness, syncope, numbness and headaches.  All other systems reviewed and are negative.   Physical Exam Updated Vital Signs BP (!) 102/47   Pulse 92   Temp 100 F (37.8 C) (Oral)   Resp 13   Ht  (1.702 m)   Wt 108 kg   SpO2 96%   BMI 37.29 kg/m  Physical Exam Vitals and nursing note reviewed.  Constitutional:      General: He is not in acute distress.    Appearance: He is well-developed. He is obese. He is ill-appearing. He is not diaphoretic.  HENT:     Head: Normocephalic and atraumatic.     Mouth/Throat:     Mouth: Mucous membranes are dry.     Pharynx: Oropharynx is clear.  Eyes:     Conjunctiva/sclera: Conjunctivae normal.  Cardiovascular:     Rate and Rhythm: Normal rate and regular rhythm.     Pulses: Normal pulses.          Radial pulses are 2+ on the right side and 2+ on the left side.       Posterior tibial pulses are 2+ on the right side and 2+ on the left side.     Heart sounds: Normal heart sounds.     Comments: Tactile temperature in the extremities appropriate and equal bilaterally. Pulmonary:     Effort: Pulmonary effort is normal. No respiratory distress.     Breath sounds: Normal breath sounds.     Comments: SPO2 down to 85% on room air, requiring 2 to 3 L supplemental O2 to maintain 96%. Some increased work of breathing with any movement or change in position. Abdominal:     Palpations: Abdomen is soft.     Tenderness: There is abdominal tenderness. There is no guarding.    Genitourinary:    Comments: Genital Exam: Penis, scrotum, and testicles without swelling, lesions, or tenderness. No penile discharge.    No inguinal lymphadenopathy.  Overall normal male genitalia.  Musculoskeletal:     Cervical back: Neck supple.     Right lower leg: 1+ Pitting Edema present.     Left lower leg: 1+ Pitting Edema present.     Comments: Bilateral, pitting lower extremity edema.  Patient states this is not acute.  Lymphadenopathy:     Cervical: No cervical adenopathy.  Skin:    General: Skin is warm and dry.     Comments: Hot to touch. Overall skin exam performed without noted lesions, skin discoloration, or wounds.  Neurological:     Mental Status: He is alert and oriented to person, place, and time.     Comments: Sensation grossly intact to light touch in the extremities.   Grip strengths equal bilaterally.   Strength 5/5 in all extremities.  Cranial nerves III-XII grossly intact.  Handles oral secretions without noted difficulty.  No noted phonation or speech deficit. No facial droop.   Psychiatric:        Mood and Affect: Mood and affect normal.        Speech: Speech normal.     ED Results / Procedures / Treatments   Labs (all labs ordered are listed, but only abnormal results are displayed) Labs Reviewed  BASIC METABOLIC PANEL - Abnormal; Notable for the following components:      Result Value   Sodium 134 (*)    Glucose, Bld 123 (*)    BUN 25 (*)    Creatinine, Ser 1.57 (*)    Calcium 8.7 (*)    GFR, Estimated 43 (*)    All other components within normal limits  CBC - Abnormal; Notable for the following components:   WBC 12.3 (*)    RBC 3.88 (*)    HCT 38.4 (*)    All other components within normal limits  URINALYSIS, ROUTINE W REFLEX MICROSCOPIC - Abnormal; Notable for the following  components:   Hgb urine dipstick TRACE (*)    All other components within normal limits  HEPATIC FUNCTION PANEL - Abnormal; Notable for the following components:   Total Protein 6.4 (*)    Albumin 3.4 (*)    AST 55 (*)    All other components within normal limits  URINALYSIS, MICROSCOPIC (REFLEX)  - Abnormal; Notable for the following components:   Bacteria, UA MANY (*)    All other components within normal limits  I-STAT VENOUS BLOOD GAS, ED - Abnormal; Notable for the following components:   pO2, Ven 25.0 (*)    Bicarbonate 29.0 (*)    Acid-Base Excess 3.0 (*)    Sodium 132 (*)    HCT 31.0 (*)    Hemoglobin 10.5 (*)    All other components within normal limits  RESP PANEL BY RT-PCR (FLU A&B, COVID) ARPGX2  CULTURE, BLOOD (ROUTINE X 2)  CULTURE, BLOOD (ROUTINE X 2)  URINE CULTURE  LACTIC ACID, PLASMA  BRAIN NATRIURETIC PEPTIDE  LIPASE, BLOOD  TSH  LACTIC ACID, PLASMA   BUN  Date Value Ref Range Status  08/02/2020 25 (H) 8 - 23 mg/dL Final  66/01/3015 19 8 - 23 mg/dL Final  09/07/3233 10 8 - 23 mg/dL Final  57/32/2025 13 8 - 23 mg/dL Final   Creatinine, Ser  Date Value Ref Range Status  08/02/2020 1.57 (H) 0.61 - 1.24 mg/dL Final  42/70/6237 6.28 0.61 - 1.24 mg/dL Final  31/51/7616 0.73 0.61 - 1.24 mg/dL Final  71/01/2693 8.54 0.61 - 1.24 mg/dL Final      EKG EKG Interpretation  Date/Time:  Saturday August 02 2020 13:10:59 EST Ventricular Rate:  97 PR Interval:    QRS Duration: 76 QT Interval:  315 QTC Calculation: 401 R Axis:   -27 Text Interpretation: Sinus or ectopic atrial rhythm Prolonged PR interval Borderline left axis deviation Low voltage, precordial leads Abnormal R-wave progression, early transition No STEMI Confirmed by Alvester Chou [phone]) on 08/02/2020 2:39:31 PM   Radiology CT CHEST ABDOMEN PELVIS W CONTRAST  Result Date: 08/02/2020 CLINICAL DATA:  Generalized weakness, decreased appetite, low-grade fever EXAM: CT CHEST, ABDOMEN, AND PELVIS WITH CONTRAST TECHNIQUE: Multidetector CT imaging of the chest, abdomen and pelvis was performed following the standard protocol during bolus administration of intravenous contrast. CONTRAST:  68mL OMNIPAQUE IOHEXOL 300 MG/ML  SOLN COMPARISON:  08/28/2018, 11/21/2019 FINDINGS: CT CHEST FINDINGS  Cardiovascular: The heart is unremarkable without pericardial effusion. Mild atherosclerosis and ectasia of the thoracic aorta. No evidence of dissection. There is extensive atherosclerosis of the coronary vasculature. Mediastinum/Nodes: No enlarged mediastinal, hilar, or axillary lymph nodes. Thyroid gland, trachea, and esophagus demonstrate no significant findings. Lungs/Pleura: Scattered areas of bibasilar scarring are noted. There is superimposed airspace disease within the lateral segment of the right middle lobe consistent with bronchopneumonia. No effusion or pneumothorax. The central airways are patent. Musculoskeletal: No acute or destructive bony lesions. Reconstructed images demonstrate no additional findings. CT ABDOMEN PELVIS FINDINGS Hepatobiliary: No focal liver abnormality is seen. Status post cholecystectomy. No biliary dilatation. Pancreas: Unremarkable. No pancreatic ductal dilatation or surrounding inflammatory changes. Spleen: Normal in size without focal abnormality. Adrenals/Urinary Tract: Stable right renal cyst. The kidneys otherwise enhance normally and symmetrically. The adrenals and bladder are unremarkable. Stomach/Bowel: No bowel obstruction or ileus. No bowel wall thickening or inflammatory change. Vascular/Lymphatic: Aortic atherosclerosis. No enlarged abdominal or pelvic lymph nodes. Reproductive: Prostate is unremarkable. Other: No free fluid or free gas. No abdominal wall hernia. Musculoskeletal: No acute or  destructive bony lesions. Previous augmentation of the L1 and S1 vertebral bodies. Grade 1 anterolisthesis of L4 on L5 unchanged. Reconstructed images demonstrate no additional findings. IMPRESSION: 1. Right middle lobe bronchopneumonia. 2. No acute intra-abdominal or intrapelvic process. 3. Aortic Atherosclerosis (ICD10-I70.0). Electronically Signed   By: Sharlet Salina M.D.   On: 08/02/2020 16:36    Procedures Procedures (including critical care time)  Medications  Ordered in ED Medications  0.9 %  sodium chloride infusion (has no administration in time range)  lactated ringers bolus 500 mL (0 mLs Intravenous Stopped 08/02/20 1841)  iohexol (OMNIPAQUE) 300 MG/ML solution 100 mL (80 mLs Intravenous Contrast Given 08/02/20 1604)  acetaminophen (TYLENOL) tablet 650 mg (650 mg Oral Given 08/02/20 1656)  cefTRIAXone (ROCEPHIN) 1 g in sodium chloride 0.9 % 100 mL IVPB (0 g Intravenous Stopped 08/02/20 1735)  azithromycin (ZITHROMAX) 500 mg in sodium chloride 0.9 % 250 mL IVPB (0 mg Intravenous Stopped 08/02/20 1846)  lactated ringers bolus 500 mL (0 mLs Intravenous Stopped 08/02/20 2005)    ED Course  I have reviewed the triage vital signs and the nursing notes.  Pertinent labs & imaging results that were available during my care of the patient were reviewed by me and considered in my medical decision making (see chart for details).  Clinical Course as of Aug 02 2058  Sat Aug 02, 2020  1516 85 yo male presenting to ED with fatigue for 3 days, reporting he had no energy to get out of the bed today.  He lives with his wife and has home health aides in their house.  He reports coughing for a few days.  Denies n/v/diarrhea.  Reports diminished urination.  He is on lasix for diuresis and has been compliant.  On exam he is obese.  Some fine crackles on lung exam.  No abd ttp.  +Pitting edema of extremities.  He was reported as hypoxic on arrival but on my exam he had a poor pleth on his SpO2 monitor with cold fingers.  With readjustment and good pleth he maintained 98-100% O2 on room air.     [MT]  1518 Temp 100F oral and WBC 12.3.  Plan for sepsis /infection w/u including UA and CT scans, as ddx also includes PNA or intraabdominal source of infection.  BNP is pending as there may be a mild CHF component to this.  500 cc bolus ordered initially, with plan to be judicious w/ fluids.   [MT]  1610 Spoke with Dr. Artis Flock, hospitalist.  Agrees to admit the patient to [facility].   [facility] informs [place] there are no beds available currently.   [SJ]    Clinical Course User Index [MT] Trifan, Kermit Balo, MD [SJ] Concepcion Living   MDM Rules/Calculators/A&P                          Patient presents with complaint of generalized weakness. Initially afebrile orally, however, found to be febrile rectally.  Mild/borderline hypotension, improved with IV fluids.  Patient also stated he felt much better with IV fluids. Also noted some hypoxia, but no increased work of breathing while at rest. I personally reviewed and interpreted the patient's labs and imaging studies. Mild leukocytosis present. UA with many bacteria, trace hemoglobin, however, no WBCs or other abnormalities.  Urine culture pending.  No lactic acidosis. Elevation in creatinine and BUN, suspect AKI.  Additionally, patient dry on exam. CT with evidence of right sided pneumonia.  Covid negative.  Patient admitted for community-acquired pneumonia with hypoxia and AKI.  Findings and plan of care discussed with Marguarite Arbour, MD. Dr. Renaye Rakers personally evaluated and examined this patient.   Unfortunately, due to a lack of available inpatient beds at the accepting facility, patient was to be held in this facility in the meantime. A number of his home medications were ordered, however, some of these were held due to the patient's blood pressure around 100 systolic multiple times during his ED course.  These included medications such as morphine, losartan, torsemide, to name a few.     Vitals:   08/02/20 1831 08/02/20 1852 08/02/20 2000 08/02/20 2100  BP: (!) 90/58 (!) 110/55 (!) 137/96 (!) 106/52  Pulse: 71 74 76 62  Resp: 18 18 (!) 28 14  Temp:      TempSrc:      SpO2: 100% 97% 92% 98%  Weight:      Height:         Final Clinical Impression(s) / ED Diagnoses Final diagnoses:  Generalized weakness  AKI (acute kidney injury) [facility])    Rx / DC Orders ED Discharge Orders    None       Concepcion Living 08/02/20 2149    Terald Sleeper, MD 08/03/20 [phone]

## 2020-08-02 NOTE — ED Triage Notes (Signed)
Pt reports generalized weakness since thanksgiving, decreased appetite, low grade fever.  Difficulty with ambulation, no falls.  Does report mild nausea, some alternating constipation and loose stool.

## 2020-08-02 NOTE — ED Notes (Signed)
Per daughter, pt has been having digestive issues for past 2 weeks, increased weakness, coughing.  Participates in [facility] program for primary care.  Saw them Tuesday.  Awaiting referral to GI doctor.

## 2020-08-02 NOTE — ED Notes (Signed)
Unable to obtain iv access and remaining labwork with multiple attempts by RN x3  With u/s.  Pt to CT scan, provider to attempt upon return

## 2020-08-02 NOTE — ED Notes (Signed)
Daughter Jethro Bolus called to provide contact info if any questions [phone]

## 2020-08-02 NOTE — ED Notes (Signed)
EKG given to Dr Renaye Rakers

## 2020-08-02 NOTE — ED Notes (Signed)
IV attempt x2 without success, able to obtain VBG only.

## 2020-08-02 NOTE — ED Notes (Signed)
RECTAL TEMP 101.7

## 2020-08-02 NOTE — ED Notes (Signed)
Oxygen sat 91% room air, arrived via ems with oxygen 4L in place, no previous oxygen need prior to arrival.  When coached to take deep breaths, sat improves to 94% room air.  At rest stays steady at 91%.  Oxygen at bedside available if needed.

## 2020-08-02 NOTE — ED Notes (Signed)
Patient transported to CT 

## 2020-08-03 ENCOUNTER — Encounter (HOSPITAL_COMMUNITY): Payer: Self-pay | Admitting: Family Medicine

## 2020-08-03 DIAGNOSIS — R52 Pain, unspecified: Secondary | ICD-10-CM | POA: Diagnosis present

## 2020-08-03 DIAGNOSIS — Z79899 Other long term (current) drug therapy: Secondary | ICD-10-CM | POA: Diagnosis not present

## 2020-08-03 DIAGNOSIS — I10 Essential (primary) hypertension: Secondary | ICD-10-CM

## 2020-08-03 DIAGNOSIS — M1990 Unspecified osteoarthritis, unspecified site: Secondary | ICD-10-CM | POA: Diagnosis present

## 2020-08-03 DIAGNOSIS — E559 Vitamin D deficiency, unspecified: Secondary | ICD-10-CM | POA: Diagnosis present

## 2020-08-03 DIAGNOSIS — N179 Acute kidney failure, unspecified: Secondary | ICD-10-CM | POA: Diagnosis present

## 2020-08-03 DIAGNOSIS — Z8249 Family history of ischemic heart disease and other diseases of the circulatory system: Secondary | ICD-10-CM | POA: Diagnosis not present

## 2020-08-03 DIAGNOSIS — Z7982 Long term (current) use of aspirin: Secondary | ICD-10-CM | POA: Diagnosis not present

## 2020-08-03 DIAGNOSIS — E785 Hyperlipidemia, unspecified: Secondary | ICD-10-CM | POA: Diagnosis present

## 2020-08-03 DIAGNOSIS — F0280 Dementia in other diseases classified elsewhere without behavioral disturbance: Secondary | ICD-10-CM | POA: Diagnosis present

## 2020-08-03 DIAGNOSIS — M545 Low back pain, unspecified: Secondary | ICD-10-CM | POA: Diagnosis present

## 2020-08-03 DIAGNOSIS — J189 Pneumonia, unspecified organism: Secondary | ICD-10-CM | POA: Diagnosis present

## 2020-08-03 DIAGNOSIS — I739 Peripheral vascular disease, unspecified: Secondary | ICD-10-CM | POA: Diagnosis present

## 2020-08-03 DIAGNOSIS — F0391 Unspecified dementia with behavioral disturbance: Secondary | ICD-10-CM | POA: Diagnosis present

## 2020-08-03 DIAGNOSIS — J9601 Acute respiratory failure with hypoxia: Secondary | ICD-10-CM | POA: Diagnosis present

## 2020-08-03 DIAGNOSIS — Z972 Presence of dental prosthetic device (complete) (partial): Secondary | ICD-10-CM | POA: Diagnosis not present

## 2020-08-03 DIAGNOSIS — K219 Gastro-esophageal reflux disease without esophagitis: Secondary | ICD-10-CM | POA: Diagnosis present

## 2020-08-03 DIAGNOSIS — E7800 Pure hypercholesterolemia, unspecified: Secondary | ICD-10-CM | POA: Diagnosis present

## 2020-08-03 DIAGNOSIS — E871 Hypo-osmolality and hyponatremia: Secondary | ICD-10-CM | POA: Diagnosis present

## 2020-08-03 DIAGNOSIS — Z20822 Contact with and (suspected) exposure to covid-19: Secondary | ICD-10-CM | POA: Diagnosis present

## 2020-08-03 DIAGNOSIS — E739 Lactose intolerance, unspecified: Secondary | ICD-10-CM | POA: Diagnosis present

## 2020-08-03 DIAGNOSIS — G20 Parkinson's disease: Secondary | ICD-10-CM | POA: Diagnosis present

## 2020-08-03 DIAGNOSIS — Z79890 Hormone replacement therapy (postmenopausal): Secondary | ICD-10-CM | POA: Diagnosis not present

## 2020-08-03 DIAGNOSIS — E039 Hypothyroidism, unspecified: Secondary | ICD-10-CM | POA: Diagnosis present

## 2020-08-03 DIAGNOSIS — G8929 Other chronic pain: Secondary | ICD-10-CM

## 2020-08-03 DIAGNOSIS — G894 Chronic pain syndrome: Secondary | ICD-10-CM | POA: Diagnosis present

## 2020-08-03 LAB — BASIC METABOLIC PANEL
Anion gap: 8 (ref 5–15)
BUN: 13 mg/dL (ref 8–23)
CO2: 24 mmol/L (ref 22–32)
Calcium: 8.1 mg/dL — ABNORMAL LOW (ref 8.9–10.3)
Chloride: 106 mmol/L (ref 98–111)
Creatinine, Ser: 0.81 mg/dL (ref 0.61–1.24)
GFR, Estimated: >60 mL/min (ref >60)
Glucose, Bld: 119 mg/dL — ABNORMAL HIGH (ref 70–99)
Potassium: 3.6 mmol/L (ref 3.5–5.1)
Sodium: 138 mmol/L (ref 135–145)

## 2020-08-03 LAB — BLOOD CULTURE ID PANEL (REFLEXED) - BCID2

## 2020-08-03 LAB — CBC WITH DIFFERENTIAL/PLATELET
Abs Immature Granulocytes: 0.03 10*3/uL (ref 0.00–0.07)
Basophils Absolute: 0 10*3/uL (ref 0.0–0.1)
Basophils Relative: 0 %
Eosinophils Absolute: 0.2 10*3/uL (ref 0.0–0.5)
Eosinophils Relative: 3 %
HCT: 34.5 % — ABNORMAL LOW (ref 39.0–52.0)
Hemoglobin: 11.3 g/dL — ABNORMAL LOW (ref 13.0–17.0)
Immature Granulocytes: 0 %
Lymphocytes Relative: 25 %
Lymphs Abs: 1.9 10*3/uL (ref 0.7–4.0)
MCH: 33.5 pg (ref 26.0–34.0)
MCHC: 32.8 g/dL (ref 30.0–36.0)
MCV: 102.4 fL — ABNORMAL HIGH (ref 80.0–100.0)
Monocytes Absolute: 0.8 10*3/uL (ref 0.1–1.0)
Monocytes Relative: 10 %
Neutro Abs: 4.9 10*3/uL (ref 1.7–7.7)
Neutrophils Relative %: 62 %
Platelets: 146 10*3/uL — ABNORMAL LOW (ref 150–400)
RBC: 3.37 MIL/uL — ABNORMAL LOW (ref 4.22–5.81)
RDW: 15 % (ref 11.5–15.5)
WBC: 7.8 10*3/uL (ref 4.0–10.5)
nRBC: 0 % (ref 0.0–0.2)

## 2020-08-03 LAB — LACTIC ACID, PLASMA: Lactic Acid, Venous: 1.6 mmol/L (ref 0.5–1.9)

## 2020-08-03 LAB — HIV ANTIBODY (ROUTINE TESTING W REFLEX): HIV Screen 4th Generation wRfx: NONREACTIVE

## 2020-08-03 MED ORDER — PREGABALIN 75 MG PO CAPS
75.0000 mg | ORAL_CAPSULE | Freq: Every day | ORAL | Status: DC
  Administered 2020-08-03 (×2): 75 mg via ORAL
  Filled 2020-08-03 (×2): qty 1

## 2020-08-03 MED ORDER — SODIUM CHLORIDE 0.9 % IV SOLN
2.0000 g | INTRAVENOUS | Status: DC
  Administered 2020-08-03: 2 g via INTRAVENOUS
  Filled 2020-08-03: qty 2
  Filled 2020-08-03: qty 20

## 2020-08-03 MED ORDER — LACTATED RINGERS IV SOLN: INTRAVENOUS | Status: DC

## 2020-08-03 MED ORDER — BISACODYL 5 MG PO TBEC: 5.0000 mg | DELAYED_RELEASE_TABLET | Freq: Every day | ORAL | Status: DC | PRN

## 2020-08-03 MED ORDER — POLYETHYLENE GLYCOL 3350 17 G PO PACK
17.0000 g | PACK | Freq: Every day | ORAL | Status: DC
  Administered 2020-08-04: 17 g via ORAL
  Filled 2020-08-03: qty 1

## 2020-08-03 MED ORDER — MORPHINE SULFATE ER 30 MG PO TBCR
30.0000 mg | EXTENDED_RELEASE_TABLET | Freq: Two times a day (BID) | ORAL | Status: DC
  Administered 2020-08-03 – 2020-08-04 (×4): 30 mg via ORAL
  Filled 2020-08-03 (×4): qty 2

## 2020-08-03 MED ORDER — AZITHROMYCIN 250 MG PO TABS
500.0000 mg | ORAL_TABLET | Freq: Every evening | ORAL | Status: DC
  Administered 2020-08-03: 500 mg via ORAL
  Filled 2020-08-03: qty 2

## 2020-08-03 MED ORDER — ENOXAPARIN SODIUM 40 MG/0.4ML ~~LOC~~ SOLN
40.0000 mg | SUBCUTANEOUS | Status: DC
  Administered 2020-08-03 – 2020-08-04 (×2): 40 mg via SUBCUTANEOUS
  Filled 2020-08-03 (×2): qty 0.4

## 2020-08-03 MED ORDER — MORPHINE SULFATE 15 MG PO TABS
15.0000 mg | ORAL_TABLET | ORAL | Status: DC | PRN
  Administered 2020-08-03: 15 mg via ORAL
  Filled 2020-08-03: qty 1

## 2020-08-03 NOTE — ED Notes (Signed)
Spoke to pt daughter per pt request; updated on pt transfer and given information for pt location and number of RN; pt and pt daughter gave verbal consent for transfer as signature pad not available

## 2020-08-03 NOTE — Progress Notes (Signed)
PHARMACY - PHYSICIAN COMMUNICATION CRITICAL VALUE ALERT - BLOOD CULTURE IDENTIFICATION (BCID)  Kenneth Brandt is an 85 y.o. male who presented to [facility] on 08/02/2020 with a chief complaint of generalized weakness, poor PO intake.   Assessment: community acquired pneumonia   Name of physician (or Provider) Contacted: Audrea Muscat, NP  Current antibiotics: Ceftriaxone and Azithromycin  Changes to prescribed antibiotics recommended:  Patient is on recommended antibiotics - No changes needed  Presume staph species in 1/4 bottles likely a contaminant.   Results for orders placed or performed during the hospital encounter of 08/02/20  Blood Culture ID Panel (Reflexed) (Collected: 08/02/2020  1:42 PM)  Result Value Ref Range   Enterococcus faecalis NOT DETECTED NOT DETECTED   Enterococcus Faecium NOT DETECTED NOT DETECTED   Listeria monocytogenes NOT DETECTED NOT DETECTED   Staphylococcus species DETECTED (A) NOT DETECTED   Staphylococcus aureus (BCID) NOT DETECTED NOT DETECTED   Staphylococcus epidermidis NOT DETECTED NOT DETECTED   Staphylococcus lugdunensis NOT DETECTED NOT DETECTED   Streptococcus species NOT DETECTED NOT DETECTED   Streptococcus agalactiae NOT DETECTED NOT DETECTED   Streptococcus pneumoniae NOT DETECTED NOT DETECTED   Streptococcus pyogenes NOT DETECTED NOT DETECTED   A.calcoaceticus-baumannii NOT DETECTED NOT DETECTED   Bacteroides fragilis NOT DETECTED NOT DETECTED   Enterobacterales NOT DETECTED NOT DETECTED   Enterobacter cloacae complex NOT DETECTED NOT DETECTED   Escherichia coli NOT DETECTED NOT DETECTED   Klebsiella aerogenes NOT DETECTED NOT DETECTED   Klebsiella oxytoca NOT DETECTED NOT DETECTED   Klebsiella pneumoniae NOT DETECTED NOT DETECTED   Proteus species NOT DETECTED NOT DETECTED   Salmonella species NOT DETECTED NOT DETECTED   Serratia marcescens NOT DETECTED NOT DETECTED   Haemophilus influenzae NOT DETECTED NOT DETECTED   Neisseria  meningitidis NOT DETECTED NOT DETECTED   Pseudomonas aeruginosa NOT DETECTED NOT DETECTED   Stenotrophomonas maltophilia NOT DETECTED NOT DETECTED   Candida albicans NOT DETECTED NOT DETECTED   Candida auris NOT DETECTED NOT DETECTED   Candida glabrata NOT DETECTED NOT DETECTED   Candida krusei NOT DETECTED NOT DETECTED   Candida parapsilosis NOT DETECTED NOT DETECTED   Candida tropicalis NOT DETECTED NOT DETECTED   Cryptococcus neoformans/gattii NOT DETECTED NOT DETECTED    Jamse Mead 08/03/2020  8:03 PM

## 2020-08-03 NOTE — H&P (Signed)
History and Physical    Kenneth Brandt OZH:[id number] DOB: 11-05-1934 DOA: 08/02/2020  PCP: Pcp, No  Patient coming from: Home  I have personally briefly reviewed patient's old medical records in [facility]  Chief Complaint: Fatigue  HPI: Kenneth Brandt is a 85 y.o. male with medical history significant of chronic back pain, dementia, HTN, PAD.  Pt presents to the ED with generalized weakness for past several days.  Poor appetite and PO intake.  Symptoms onset about 3 days ago, persistent, unchanged, nothing makes better or worse.  No known fevers, SOB, cough, CP, abd pain.  Has had COVID vaccine.   ED Course: Pt with new 2L O2 requirement, mild AKI creat 1.5 up from 1.0 baseline.  CT chest/abd/pelvis reveals RML PNA.  Started on rocephin + azithromycin and transferred to [facility] for admission.   Review of Systems: As per HPI, otherwise all review of systems negative.  Past Medical History:  Diagnosis Date  . Anxiety   . Arthritis   . Chronic back pain   . Dementia (HCC)   . Depression   . Dyspnea    occasionally  . Essential tremor   . GERD (gastroesophageal reflux disease)   . Hepatitis    A -treated  . HSV infection   . Hypercholesteremia   . Hypertension   . Hypothyroid   . Neuromuscular disorder (HCC)   . PAD (peripheral artery disease) (HCC)   . Seasonal allergies   . Wears glasses   . Wears partial dentures    upper and lower    Past Surgical History:  Procedure Laterality Date  . ANKLE FRACTURE SURGERY Right   . APPENDECTOMY     remote  . BACK SURGERY  2009, 2011   laminectomies  . BIOPSY  11/21/2019   Procedure: BIOPSY;  Surgeon: Graylin Shiver, MD;  Location: [facility] ENDOSCOPY;  Service: Endoscopy;;  . CARDIOVASCULAR STRESS TEST     04/25/15 Nuclear stress test [facility]): No definite evidence for inducible ischemia or infarct, EF 74%  . CATARACT EXTRACTION Bilateral   . CHOLECYSTECTOMY N/A 02/14/2020   Procedure: LAPAROSCOPIC  CHOLECYSTECTOMY;  Surgeon: Harriette Bouillon, MD;  Location: [facility] OR;  Service: General;  Laterality: N/A;  . ESOPHAGOGASTRODUODENOSCOPY (EGD) WITH PROPOFOL N/A 11/21/2019   Procedure: ESOPHAGOGASTRODUODENOSCOPY (EGD) WITH PROPOFOL;  Surgeon: Graylin Shiver, MD;  Location: [facility] ENDOSCOPY;  Service: Endoscopy;  Laterality: N/A;  . IR KYPHO LUMBAR INC FX REDUCE BONE BX UNI/BIL CANNULATION INC/IMAGING  03/14/2018  . IR SACROPLASTY BILATERAL  08/18/2018  . REPLACEMENT TOTAL KNEE Right 2012  . RETINAL DETACHMENT SURGERY Right   . TONSILLECTOMY    . TOTAL KNEE ARTHROPLASTY Left 02/22/2017  . TOTAL KNEE ARTHROPLASTY Left 02/22/2017   Procedure: LEFT TOTAL KNEE ARTHROPLASTY;  Surgeon: Sheral Apley, MD;  Location: [facility] OR;  Service: Orthopedics;  Laterality: Left;     reports that he has never smoked. He has never used smokeless tobacco. He reports that he does not drink alcohol and does not use drugs.  Allergies  Allergen Reactions  . Lactose Intolerance (Gi) Other (See Comments)    Gi upset    Family History  Problem Relation Age of Onset  . Cancer Mother   . Pneumonia Mother   . CAD Father   . CAD Brother   . Cancer Other   . Stroke Neg Hx      Prior to Admission medications   Medication Sig Start Date End Date Taking? Authorizing Provider  aspirin  EC 81 MG tablet Take 81 mg by mouth daily. Swallow whole.    [provider]  atorvastatin (LIPITOR) 20 MG tablet Take 20 mg by mouth daily.    [provider]  DULoxetine (CYMBALTA) 60 MG capsule Take 60 mg by mouth daily.     [provider]  ibandronate (BONIVA) 150 MG tablet Take 150 mg by mouth every 30 (thirty) days. Take in the morning with a full glass of water, on an empty stomach, and do not take anything else by mouth or lie down for the next 30 min.    [provider]  levothyroxine (SYNTHROID, LEVOTHROID) 75 MCG tablet Take 75 mcg by mouth daily before breakfast.    [provider]   LORazepam (ATIVAN) 0.5 MG tablet Take 0.5 mg by mouth at bedtime. sleep    [provider]  losartan (COZAAR) 100 MG tablet Take 100 mg by mouth daily.     [provider]  morphine (MS CONTIN) 30 MG 12 hr tablet Take 30 mg by mouth every 12 (twelve) hours.    [provider]  morphine (MSIR) 15 MG tablet Take 15 mg by mouth every 4 (four) hours as needed for severe pain.    [provider]  pantoprazole (PROTONIX) 40 MG tablet Take 40 mg by mouth daily.    [provider]  pregabalin (LYRICA) 75 MG capsule Take by mouth.    [provider]  torsemide (DEMADEX) 20 MG tablet Take by mouth.    [provider]  traZODone (DESYREL) 100 MG tablet Take by mouth.    [provider]  Vitamin D, Cholecalciferol, 10 MCG (400 UNIT) CAPS Take 400 Units by mouth daily.    [provider]    Physical Exam: Vitals:   08/02/20 2200 08/02/20 2300 08/02/20 2340 08/03/20 0108  BP: (!) 120/51 (!) 109/52  112/66  Pulse: 63 62  64  Resp: 14 17  18   Temp:   97.8 F (36.6 C) 98.5 F (36.9 C)  TempSrc:   Oral Oral  SpO2: 100% 97%  99%  Weight:    107.2 kg  Height:        Constitutional: NAD, calm, comfortable Eyes: PERRL, lids and conjunctivae normal ENMT: Mucous membranes are moist. Posterior pharynx clear of any exudate or lesions.Normal dentition.  Neck: normal, supple, no masses, no thyromegaly Respiratory: clear to auscultation bilaterally, no wheezing, no crackles. Normal respiratory effort. No accessory muscle use.  Cardiovascular: Regular rate and rhythm, no murmurs / rubs / gallops. No extremity edema. 2+ pedal pulses. No carotid bruits.  Abdomen: no tenderness, no masses palpated. No hepatosplenomegaly. Bowel sounds positive.  Musculoskeletal: no clubbing / cyanosis. No joint deformity upper and lower extremities. Good ROM, no contractures. Normal muscle tone.  Skin: no rashes, lesions, ulcers. No  induration Neurologic: CN 2-12 grossly intact. Sensation intact, DTR normal. Strength 5/5 in all 4.  Psychiatric: Normal judgment and insight. Alert and oriented x 3. Normal mood.    Labs on Admission: I have personally reviewed following labs and imaging studies  CBC: Recent Labs  Lab 08/02/20 1342 08/02/20 1610  WBC 12.3*  --   HGB 13.1 10.5*  HCT 38.4* 31.0*  MCV 99.0  --   PLT 179  --    Basic Metabolic Panel: Recent Labs  Lab 08/02/20 1342 08/02/20 1610  NA 134* 132*  K 3.7 4.3  CL 99  --   CO2 26  --   GLUCOSE  123*  --   BUN 25*  --   CREATININE 1.57*  --   CALCIUM 8.7*  --    GFR: Estimated Creatinine Clearance: 41.6 mL/min (A) (by C-G formula based on SCr of 1.57 mg/dL (H)). Liver Function Tests: Recent Labs  Lab 08/02/20 1342  AST 55*  ALT 29  ALKPHOS 77  BILITOT 1.0  PROT 6.4*  ALBUMIN 3.4*   Recent Labs  Lab 08/02/20 1352  LIPASE 17   No results for input(s): AMMONIA in the last 168 hours. Coagulation Profile: No results for input(s): INR, PROTIME in the last 168 hours. Cardiac Enzymes: No results for input(s): CKTOTAL, CKMB, CKMBINDEX, TROPONINI in the last 168 hours. BNP (last 3 results) No results for input(s): PROBNP in the last 8760 hours. HbA1C: No results for input(s): HGBA1C in the last 72 hours. CBG: No results for input(s): GLUCAP in the last 168 hours. Lipid Profile: No results for input(s): CHOL, HDL, LDLCALC, TRIG, CHOLHDL, LDLDIRECT in the last 72 hours. Thyroid Function Tests: Recent Labs    08/02/20 1630  TSH 0.750   Anemia Panel: No results for input(s): VITAMINB12, FOLATE, FERRITIN, TIBC, IRON, RETICCTPCT in the last 72 hours. Urine analysis:    Component Value Date/Time   COLORURINE YELLOW 08/02/2020 1645   APPEARANCEUR CLEAR 08/02/2020 1645   LABSPEC 1.015 08/02/2020 1645   PHURINE 5.5 08/02/2020 1645   GLUCOSEU NEGATIVE 08/02/2020 1645   HGBUR TRACE (A) 08/02/2020 1645   BILIRUBINUR NEGATIVE 08/02/2020  1645   KETONESUR NEGATIVE 08/02/2020 1645   PROTEINUR NEGATIVE 08/02/2020 1645   NITRITE NEGATIVE 08/02/2020 1645   LEUKOCYTESUR NEGATIVE 08/02/2020 1645    Radiological Exams on Admission: CT CHEST ABDOMEN PELVIS W CONTRAST  Result Date: 08/02/2020 CLINICAL DATA:  Generalized weakness, decreased appetite, low-grade fever EXAM: CT CHEST, ABDOMEN, AND PELVIS WITH CONTRAST TECHNIQUE: Multidetector CT imaging of the chest, abdomen and pelvis was performed following the standard protocol during bolus administration of intravenous contrast. CONTRAST:  34mL OMNIPAQUE IOHEXOL 300 MG/ML  SOLN COMPARISON:  08/28/2018, 11/21/2019 FINDINGS: CT CHEST FINDINGS Cardiovascular: The heart is unremarkable without pericardial effusion. Mild atherosclerosis and ectasia of the thoracic aorta. No evidence of dissection. There is extensive atherosclerosis of the coronary vasculature. Mediastinum/Nodes: No enlarged mediastinal, hilar, or axillary lymph nodes. Thyroid gland, trachea, and esophagus demonstrate no significant findings. Lungs/Pleura: Scattered areas of bibasilar scarring are noted. There is superimposed airspace disease within the lateral segment of the right middle lobe consistent with bronchopneumonia. No effusion or pneumothorax. The central airways are patent. Musculoskeletal: No acute or destructive bony lesions. Reconstructed images demonstrate no additional findings. CT ABDOMEN PELVIS FINDINGS Hepatobiliary: No focal liver abnormality is seen. Status post cholecystectomy. No biliary dilatation. Pancreas: Unremarkable. No pancreatic ductal dilatation or surrounding inflammatory changes. Spleen: Normal in size without focal abnormality. Adrenals/Urinary Tract: Stable right renal cyst. The kidneys otherwise enhance normally and symmetrically. The adrenals and bladder are unremarkable. Stomach/Bowel: No bowel obstruction or ileus. No bowel wall thickening or inflammatory change. Vascular/Lymphatic: Aortic  atherosclerosis. No enlarged abdominal or pelvic lymph nodes. Reproductive: Prostate is unremarkable. Other: No free fluid or free gas. No abdominal wall hernia. Musculoskeletal: No acute or destructive bony lesions. Previous augmentation of the L1 and S1 vertebral bodies. Grade 1 anterolisthesis of L4 on L5 unchanged. Reconstructed images demonstrate no additional findings. IMPRESSION: 1. Right middle lobe bronchopneumonia. 2. No acute intra-abdominal or intrapelvic process. 3. Aortic Atherosclerosis (ICD10-I70.0). Electronically Signed   By: Sharlet Salina M.D.   On: 08/02/2020 16:36  EKG: Independently reviewed.  Assessment/Plan Principal Problem:   Community acquired pneumonia of right middle lobe of lung Active Problems:   Dementia with behavioral disturbance (HCC)   Essential hypertension   Chronic pain   Acute respiratory failure with hypoxia (HCC)   AKI (acute kidney injury) (HCC)    1. RML CAP - 1. PNA pathway 2. Rocephin + azithro 3. Cultures pending 2. Acute resp failure with hypoxia - 1. New 2L O2 requirement 2. Cont pulse ox 3. AKI - 1. IVF: 500cc bolus and 100cc/hr continuous 2. Hold diuretics and ARB 3. Strict intake and output 4. Repeat BMP in AM 4. HTN - 1. Hold ARB and diuretic 5. Chronic pain - 1. Cont home MS Contin and MSIR 2. Cont home lyrica 6. Dementia - 1. Seems really mild, pt seems very with it, able to tell me his home meds which match what we have on file. 2. Cont cymbalta, QHS Ativan  DVT prophylaxis: Lovenox Code Status: Full Family Communication: No family in room Disposition Plan: Home after O2 requirement improved Consults called: None Admission status: Admit to inpatient  Severity of Illness: The appropriate patient status for this patient is INPATIENT. Inpatient status is judged to be reasonable and necessary in order to provide the required intensity of service to ensure the patient's safety. The patient's presenting symptoms,  physical exam findings, and initial radiographic and laboratory data in the context of their chronic comorbidities is felt to place them at high risk for further clinical deterioration. Furthermore, it is not anticipated that the patient will be medically stable for discharge from the hospital within 2 midnights of admission. The following factors support the patient status of inpatient.   IP status due to new O2 requirement associated with RML PNA.  Also has mild AKI.   * I certify that at the point of admission it is my clinical judgment that the patient will require inpatient hospital care spanning beyond 2 midnights from the point of admission due to high intensity of service, high risk for further deterioration and high frequency of surveillance required.*    Hafsa Lohn M. DO [facility]  How to contact the [facility] Attending or Consulting provider 7A - 7P or covering provider during after hours 7P -7A, for this patient?  1. Check the care team in [facility] and look for a) attending/consulting [facility] provider listed and b) the [facility] team listed 2. Log into [email or web address]  Amion Physician Scheduling and messaging for groups and whole hospitals  On call and physician scheduling software for group practices, residents, hospitalists and other medical providers for call, clinic, rotation and shift schedules. On[employer] is a hospital-wide system for scheduling doctors and paging doctors on call. EasyPlot is for scientific plotting and data analysis.  [email or web address]  and use [place]'s universal password to access. If you do not have the password, please contact the hospital operator.  3. Locate the [facility] provider you are looking for under [facility] and page to a number that you can be directly reached. 4. If you still have difficulty reaching the provider, please page the [facility] (Director on Call) for the Hospitalists listed on amion for assistance.  08/03/2020, 1:35 AM

## 2020-08-03 NOTE — Progress Notes (Signed)
Patient is 85 year old male with history of chronic back pain, dementia, hypertension, peripheral vascular disease who presents to the emergency room with complaints of general weakness, poor oral intake.  He lives with his wife, ambulates without any problems.  On presentation, he was found to have AKI with creatinine of 1.5, he had leukocytosis.  CT imaging showed right middle lobe pneumonia.  Patient was admitted for community acquired pneumonia management. He was started on ceftriaxone and azithromycin. Patient seen and examined at the bedside this morning.  Today he remains hemodynamically stable.  His lungs are clear on auscultation.  He denies any shortness of breath or cough.  His leukocytosis has resolved.  We have plan to monitor him on room air today.  We have requested for physical therapy evaluation. Patient seen by Dr. Julian Reil this morning.  I agree with assessment and plan. If he remains medically stable, we might discharge him tomorrow with oral antibiotics

## 2020-08-04 LAB — CULTURE, BLOOD (ROUTINE X 2)

## 2020-08-04 LAB — URINE CULTURE

## 2020-08-04 MED ORDER — TORSEMIDE 20 MG PO TABS: 40.0000 mg | ORAL_TABLET | Freq: Every day | ORAL | Status: AC

## 2020-08-04 MED ORDER — AZITHROMYCIN 500 MG PO TABS: 500.0000 mg | ORAL_TABLET | Freq: Every day | ORAL | 0 refills | Status: AC

## 2020-08-04 MED ORDER — CEFDINIR 300 MG PO CAPS: 300.0000 mg | ORAL_CAPSULE | Freq: Two times a day (BID) | ORAL | 0 refills | Status: AC

## 2020-08-04 MED ORDER — GUAIFENESIN-DM 100-10 MG/5ML PO SYRP: 5.0000 mL | ORAL_SOLUTION | ORAL | 1 refills | Status: DC | PRN

## 2020-08-04 MED ORDER — LOSARTAN POTASSIUM 50 MG PO TABS
50.0000 mg | ORAL_TABLET | Freq: Every evening | ORAL | Status: DC
Start: 2020-08-04 — End: 2021-12-25

## 2020-08-04 MED ORDER — GUAIFENESIN-DM 100-10 MG/5ML PO SYRP
5.0000 mL | ORAL_SOLUTION | ORAL | Status: DC | PRN
  Administered 2020-08-04: 5 mL via ORAL
  Filled 2020-08-04: qty 10

## 2020-08-04 NOTE — Progress Notes (Signed)
Reviewed Discharged Instructions with Patient; Peripheral IV discontinued.  Patient verbalized understanding.  Will Review instructions with Wife or daughter.

## 2020-08-04 NOTE — TOC Transition Note (Signed)
Transition of Care [facility]) - CM/SW Discharge Note   Patient Details  Name: Kenneth Brandt MRN: [id number] Date of Birth: 10/20/1934  Transition of Care [facility]) CM/SW Contact:  Ida Rogue, LCSW Phone Number: 08/04/2020, 1:56 PM   Clinical Narrative:   Patient who is scheduled for d/c today is a client of [facility].  Spoke with Marylu Lund, [phone], who is checking on arranging transportation for patient.  I alerted her that MD put in order for [facility] PT, OT.  She will call me back to let me know if she needs my help for arranging transportation. I gave her the number for [employer] in case all she needs to do is coordinate pick up time. TOC will continue to follow during the course of hospitalization.     Final next level of care: Home w Home Health Services Barriers to Discharge: No Barriers Identified   Patient Goals and CMS Choice        Discharge Placement                       Discharge Plan and Services                                     Social Determinants of Health (SDOH) Interventions     Readmission Risk Interventions No flowsheet data found.

## 2020-08-04 NOTE — Discharge Summary (Signed)
Physician Discharge Summary  Kenneth Brandt MVH:[id number] DOB: Sep 13, 1934 DOA: 08/02/2020  PCP: Oneita Hurt, No  Admit date: 08/02/2020 Discharge date: 08/04/2020  Admitted From: Home Disposition:  Home  Discharge Condition:Stable CODE STATUS:FULL Diet recommendation: Heart Healthy   Brief/Interim Summary:  Patient is 85 year old male with history of chronic back pain, dementia, hypertension, peripheral vascular disease who presents to the emergency room with complaints of general weakness, poor oral intake.  He lives with his wife, ambulates without any problems.  On presentation, he was found to have AKI with creatinine of 1.5, he had leukocytosis.  CT imaging showed right middle lobe pneumonia.  Patient was admitted for community acquired pneumonia management. He was started on ceftriaxone and azithromycin. Patient seen and examined at the bedside this morning.  Today he remains hemodynamically stable.  His lungs are clear on auscultation.  He denies any shortness of breath or cough.  His leukocytosis has resolved.  Currently he is on room air.  Patient seen by physical therapy/Occupational Therapy and recommended home health on discharge.  He is medically stable for discharge home with oral antibiotics.  Following problems were addressed during his hospitalization:   Committee acquired pneumonia/right middle lobe pneumonia: Initially required oxygen supplementation for maintenance of saturation.  Currently he is on room air.  Lungs are clear on auscultation.  Antibiotics changed to oral.  AKI: Was taking torsemide, ARB at home.  Kidney function has improved and is currently at baseline.  Continue torsemide and ARB at reduced doses at home  Hypertension: Currently blood stable.  Continue to monitor at home.  Chronic pain syndrome: Continue home medications  Dementia: Currently alert and oriented.  Continue supportive care.  On Cymbalta,  Ativan  Discharge Diagnoses:  Principal Problem:    Community acquired pneumonia of right middle lobe of lung Active Problems:   Dementia with behavioral disturbance (HCC)   Essential hypertension   Chronic pain   Acute respiratory failure with hypoxia (HCC)   AKI (acute kidney injury) [facility])    Discharge Instructions  Discharge Instructions    Diet - low sodium heart healthy   Complete by: As directed    Discharge instructions   Complete by: As directed    1)Please take prescribed medications as instructed 2)Follow up with your PCP in a week   Increase activity slowly   Complete by: As directed      Allergies as of 08/04/2020      Reactions   Lactose Intolerance (gi) Other (See Comments)   Gi upset      Medication List    TAKE these medications   aspirin EC 81 MG tablet Take 81 mg by mouth daily. Swallow whole.   atorvastatin 20 MG tablet Commonly known as: LIPITOR Take 20 mg by mouth daily.   azithromycin 500 MG tablet Commonly known as: Zithromax Take 1 tablet (500 mg total) by mouth daily for 1 day. Take 1 tablet daily for 3 days.   calcium-vitamin D 500-200 MG-UNIT Tabs tablet Commonly known as: OSCAL WITH D Take 1 tablet by mouth in the morning and at bedtime.   cefdinir 300 MG capsule Commonly known as: OMNICEF Take 1 capsule (300 mg total) by mouth 2 (two) times daily for 3 days.   DULoxetine 60 MG capsule Commonly known as: CYMBALTA Take 60 mg by mouth daily.   fexofenadine 180 MG tablet Commonly known as: ALLEGRA Take 180 mg by mouth daily.   guaiFENesin-dextromethorphan 100-10 MG/5ML syrup Commonly known as: ROBITUSSIN DM Take 5  mLs by mouth every 4 (four) hours as needed for cough.   ibandronate 150 MG tablet Commonly known as: BONIVA Take 150 mg by mouth every 30 (thirty) days. Take in the morning with a full glass of water, on an empty stomach, and do not take anything else by mouth or lie down for the next 30 min.   levothyroxine 75 MCG tablet Commonly known as: SYNTHROID Take 75 mcg by  mouth daily before breakfast.   LORazepam 0.5 MG tablet Commonly known as: ATIVAN Take 0.5 mg by mouth at bedtime. sleep   losartan 100 MG tablet Commonly known as: COZAAR Take 100 mg by mouth every evening.   morphine 30 MG 12 hr tablet Commonly known as: MS CONTIN Take 30 mg by mouth every 12 (twelve) hours.   morphine 15 MG tablet Commonly known as: MSIR Take 15 mg by mouth every 4 (four) hours as needed for severe pain.   pantoprazole 40 MG tablet Commonly known as: PROTONIX Take 40 mg by mouth daily.   polyethylene glycol 17 g packet Commonly known as: MIRALAX / GLYCOLAX Take 17 g by mouth daily as needed for mild constipation.   pregabalin 75 MG capsule Commonly known as: LYRICA Take 75 mg by mouth 2 (two) times daily.   torsemide 20 MG tablet Commonly known as: DEMADEX Take 80 mg by mouth daily.   traZODone 100 MG tablet Commonly known as: DESYREL Take 100 mg by mouth at bedtime.       Allergies  Allergen Reactions  . Lactose Intolerance (Gi) Other (See Comments)    Gi upset    Consultations:  None   Procedures/Studies: CT CHEST ABDOMEN PELVIS W CONTRAST  Result Date: 08/02/2020 CLINICAL DATA:  Generalized weakness, decreased appetite, low-grade fever EXAM: CT CHEST, ABDOMEN, AND PELVIS WITH CONTRAST TECHNIQUE: Multidetector CT imaging of the chest, abdomen and pelvis was performed following the standard protocol during bolus administration of intravenous contrast. CONTRAST:  82mL OMNIPAQUE IOHEXOL 300 MG/ML  SOLN COMPARISON:  08/28/2018, 11/21/2019 FINDINGS: CT CHEST FINDINGS Cardiovascular: The heart is unremarkable without pericardial effusion. Mild atherosclerosis and ectasia of the thoracic aorta. No evidence of dissection. There is extensive atherosclerosis of the coronary vasculature. Mediastinum/Nodes: No enlarged mediastinal, hilar, or axillary lymph nodes. Thyroid gland, trachea, and esophagus demonstrate no significant findings. Lungs/Pleura:  Scattered areas of bibasilar scarring are noted. There is superimposed airspace disease within the lateral segment of the right middle lobe consistent with bronchopneumonia. No effusion or pneumothorax. The central airways are patent. Musculoskeletal: No acute or destructive bony lesions. Reconstructed images demonstrate no additional findings. CT ABDOMEN PELVIS FINDINGS Hepatobiliary: No focal liver abnormality is seen. Status post cholecystectomy. No biliary dilatation. Pancreas: Unremarkable. No pancreatic ductal dilatation or surrounding inflammatory changes. Spleen: Normal in size without focal abnormality. Adrenals/Urinary Tract: Stable right renal cyst. The kidneys otherwise enhance normally and symmetrically. The adrenals and bladder are unremarkable. Stomach/Bowel: No bowel obstruction or ileus. No bowel wall thickening or inflammatory change. Vascular/Lymphatic: Aortic atherosclerosis. No enlarged abdominal or pelvic lymph nodes. Reproductive: Prostate is unremarkable. Other: No free fluid or free gas. No abdominal wall hernia. Musculoskeletal: No acute or destructive bony lesions. Previous augmentation of the L1 and S1 vertebral bodies. Grade 1 anterolisthesis of L4 on L5 unchanged. Reconstructed images demonstrate no additional findings. IMPRESSION: 1. Right middle lobe bronchopneumonia. 2. No acute intra-abdominal or intrapelvic process. 3. Aortic Atherosclerosis (ICD10-I70.0). Electronically Signed   By: Sharlet Salina M.D.   On: 08/02/2020 16:36  Subjective:  Patient seen and examined at the bedside this morning.  Hemodynamically stable for discharge today.  Discharge Exam: Vitals:   08/04/20 0451 08/04/20 1313  BP: (!) 144/76 133/74  Pulse: 66 80  Resp: (!) 21 16  Temp: 98 F (36.7 C) 99 F (37.2 C)  SpO2: 90% 100%   Vitals:   08/03/20 1412 08/03/20 2033 08/04/20 0451 08/04/20 1313  BP: 131/71 (!) 143/66 (!) 144/76 133/74  Pulse: (!) 51 78 66 80  Resp: 16 18 (!) 21 16   Temp: 99.1 F (37.3 C) 98.4 F (36.9 C) 98 F (36.7 C) 99 F (37.2 C)  TempSrc:  Oral Oral Oral  SpO2: 96% 94% 90% 100%  Weight:      Height:        General: Pt is alert, awake, not in acute distress Cardiovascular: RRR, S1/S2 +, no rubs, no gallops Respiratory: CTA bilaterally, no wheezing, no rhonchi Abdominal: Soft, NT, ND, bowel sounds + Extremities: no edema, no cyanosis    The results of significant diagnostics from this hospitalization (including imaging, microbiology, ancillary and laboratory) are listed below for reference.     Microbiology: Recent Results (from the past 240 hour(s))  Culture, blood (routine x 2)     Status: Abnormal   Collection Time: 08/02/20  1:42 PM   Specimen: BLOOD RIGHT ARM  Result Value Ref Range Status   Specimen Description   Final    BLOOD RIGHT ARM Performed at [facility], [address] [place], [place] [address]    Special Requests   Final    BOTTLES DRAWN AEROBIC AND ANAEROBIC Blood Culture results may not be optimal due to an inadequate volume of blood received in culture bottles Performed at [facility], [address] [place], [place] [address]    Culture  Setup Time   Final    GRAM POSITIVE COCCI IN CLUSTERS AEROBIC BOTTLE ONLY CRITICAL RESULT CALLED TO, READ BACK BY AND VERIFIED WITH: PHARM D J.GADHIA AT 2000 ON 08/03/2020 BY T.SAAD    Culture (A)  Final    STAPHYLOCOCCUS HOMINIS THE SIGNIFICANCE OF ISOLATING THIS ORGANISM FROM A SINGLE SET OF BLOOD CULTURES WHEN MULTIPLE SETS ARE DRAWN IS UNCERTAIN. PLEASE NOTIFY THE MICROBIOLOGY DEPARTMENT WITHIN ONE WEEK IF SPECIATION AND SENSITIVITIES ARE REQUIRED. Performed at [facility], [address], [place], [place] [address]    Report Status 08/04/2020 FINAL  Final  Blood Culture ID Panel (Reflexed)     Status: Abnormal   Collection Time: 08/02/20  1:42 PM  Result Value Ref Range Status   Enterococcus faecalis NOT DETECTED NOT DETECTED Final    Enterococcus Faecium NOT DETECTED NOT DETECTED Final   Listeria monocytogenes NOT DETECTED NOT DETECTED Final   Staphylococcus species DETECTED (A) NOT DETECTED Final    Comment: CRITICAL RESULT CALLED TO, READ BACK BY AND VERIFIED WITH: PHARM D J.JADHIA AT 2000 ON 08/03/2020 BY T.SAAD    Staphylococcus aureus (BCID) NOT DETECTED NOT DETECTED Final   Staphylococcus epidermidis NOT DETECTED NOT DETECTED Final   Staphylococcus lugdunensis NOT DETECTED NOT DETECTED Final   Streptococcus species NOT DETECTED NOT DETECTED Final   Streptococcus agalactiae NOT DETECTED NOT DETECTED Final   Streptococcus pneumoniae NOT DETECTED NOT DETECTED Final   Streptococcus pyogenes NOT DETECTED NOT DETECTED Final   A.calcoaceticus-baumannii NOT DETECTED NOT DETECTED Final   Bacteroides fragilis NOT DETECTED NOT DETECTED Final   Enterobacterales NOT DETECTED NOT DETECTED Final   Enterobacter  cloacae complex NOT DETECTED NOT DETECTED Final   Escherichia coli NOT DETECTED NOT DETECTED Final   Klebsiella aerogenes NOT DETECTED NOT DETECTED Final   Klebsiella oxytoca NOT DETECTED NOT DETECTED Final   Klebsiella pneumoniae NOT DETECTED NOT DETECTED Final   Proteus species NOT DETECTED NOT DETECTED Final   Salmonella species NOT DETECTED NOT DETECTED Final   Serratia marcescens NOT DETECTED NOT DETECTED Final   Haemophilus influenzae NOT DETECTED NOT DETECTED Final   Neisseria meningitidis NOT DETECTED NOT DETECTED Final   Pseudomonas aeruginosa NOT DETECTED NOT DETECTED Final   Stenotrophomonas maltophilia NOT DETECTED NOT DETECTED Final   Candida albicans NOT DETECTED NOT DETECTED Final   Candida auris NOT DETECTED NOT DETECTED Final   Candida glabrata NOT DETECTED NOT DETECTED Final   Candida krusei NOT DETECTED NOT DETECTED Final   Candida parapsilosis NOT DETECTED NOT DETECTED Final   Candida tropicalis NOT DETECTED NOT DETECTED Final   Cryptococcus neoformans/gattii NOT DETECTED NOT DETECTED Final     Comment: Performed at [facility], [address], [place], [place] [address]  Culture, blood (routine x 2)     Status: None (Preliminary result)   Collection Time: 08/02/20  4:30 PM   Specimen: BLOOD LEFT ARM  Result Value Ref Range Status   Specimen Description   Final    BLOOD LEFT ARM Performed at [facility], [address] [place], [place] [address]    Special Requests   Final    BOTTLES DRAWN AEROBIC AND ANAEROBIC Blood Culture adequate volume Performed at [facility], [address] [place], [place] [address]    Culture   Final    NO GROWTH 2 DAYS Performed at [facility], [address] [place], [place] [address]    Report Status PENDING  Incomplete  Urine culture     Status: Abnormal   Collection Time: 08/02/20  4:45 PM   Specimen: Urine, Random  Result Value Ref Range Status   Specimen Description   Final    URINE, RANDOM Performed at [facility], [address] [place], [place] [address]    Special Requests   Final    NONE Performed at [facility], [address] [place], [place] [address]    Culture MULTIPLE SPECIES PRESENT, SUGGEST RECOLLECTION (A)  Final   Report Status 08/04/2020 FINAL  Final  Resp Panel by RT-PCR (Flu A&B, Covid) Nasopharyngeal Swab     Status: None   Collection Time: 08/02/20  5:07 PM   Specimen: Nasopharyngeal Swab; Nasopharyngeal(NP) swabs in vial transport medium  Result Value Ref Range Status   SARS Coronavirus 2 by RT PCR NEGATIVE NEGATIVE Final    Comment: (NOTE) SARS-CoV-2 target nucleic acids are NOT DETECTED.  The SARS-CoV-2 RNA is generally detectable in upper respiratory specimens during the acute phase of infection. The lowest concentration of SARS-CoV-2 viral copies this assay can detect is 138 copies/mL. A negative result does not preclude SARS-Cov-2 infection and should not be used as the sole basis for treatment or other patient management decisions. A  negative result may occur with  improper specimen collection/handling, submission of specimen other than nasopharyngeal swab, presence of viral mutation(s) within the areas targeted by this assay, and inadequate number of viral copies(<138 copies/mL). A negative result must be combined with clinical observations, patient history, and epidemiological information. The expected result is Negative.  Fact Sheet for Patients:  BloggerCourse.com  Fact  Sheet for Healthcare Providers:  SeriousBroker.ithttps://www.fda.gov/media/152162/download  This test is no t yet approved or cleared by the [place] FDA and  has been authorized for detection and/or diagnosis of SARS-CoV-2 by FDA under an Emergency Use Authorization (EUA). This EUA will remain  in effect (meaning this test can be used) for the duration of the COVID-19 declaration under Section 564(b)(1) of the Act, 21 U.S.C.section 360bbb-3(b)(1), unless the authorization is terminated  or revoked sooner.       Influenza A by PCR NEGATIVE NEGATIVE Final   Influenza B by PCR NEGATIVE NEGATIVE Final    Comment: (NOTE) The Xpert Xpress SARS-CoV-2/FLU/RSV plus assay is intended as an aid in the diagnosis of influenza from Nasopharyngeal swab specimens and should not be used as a sole basis for treatment. Nasal washings and aspirates are unacceptable for Xpert Xpress SARS-CoV-2/FLU/RSV testing.  Fact Sheet for Patients: BloggerCourse.comhttps://www.fda.gov/media/152166/download  Fact Sheet for Healthcare Providers: SeriousBroker.ithttps://www.fda.gov/media/152162/download  This test is not yet approved or cleared by the [place] FDA and has been authorized for detection and/or diagnosis of SARS-CoV-2 by FDA under an Emergency Use Authorization (EUA). This EUA will remain in effect (meaning this test can be used) for the duration of the COVID-19 declaration under Section 564(b)(1) of the Act, 21 U.S.C. section 360bbb-3(b)(1), unless the authorization  is terminated or revoked.  Performed at [facility], [address] [place], [place] [address]      Labs: BNP (last 3 results) Recent Labs    08/02/20 1352  BNP 44.8   Basic Metabolic Panel: Recent Labs  Lab 08/02/20 1342 08/02/20 1610 08/03/20 1247  NA 134* 132* 138  K 3.7 4.3 3.6  CL 99  --  106  CO2 26  --  24  GLUCOSE 123*  --  119*  BUN 25*  --  13  CREATININE 1.57*  --  0.81  CALCIUM 8.7*  --  8.1*   Liver Function Tests: Recent Labs  Lab 08/02/20 1342  AST 55*  ALT 29  ALKPHOS 77  BILITOT 1.0  PROT 6.4*  ALBUMIN 3.4*   Recent Labs  Lab 08/02/20 1352  LIPASE 17   No results for input(s): AMMONIA in the last 168 hours. CBC: Recent Labs  Lab 08/02/20 1342 08/02/20 1610 08/03/20 1247  WBC 12.3*  --  7.8  NEUTROABS  --   --  4.9  HGB 13.1 10.5* 11.3*  HCT 38.4* 31.0* 34.5*  MCV 99.0  --  102.4*  PLT 179  --  146*   Cardiac Enzymes: No results for input(s): CKTOTAL, CKMB, CKMBINDEX, TROPONINI in the last 168 hours. BNP: Invalid input(s): POCBNP CBG: No results for input(s): GLUCAP in the last 168 hours. D-Dimer No results for input(s): DDIMER in the last 72 hours. Hgb A1c No results for input(s): HGBA1C in the last 72 hours. Lipid Profile No results for input(s): CHOL, HDL, LDLCALC, TRIG, CHOLHDL, LDLDIRECT in the last 72 hours. Thyroid function studies Recent Labs    08/02/20 1630  TSH 0.750   Anemia work up No results for input(s): VITAMINB12, FOLATE, FERRITIN, TIBC, IRON, RETICCTPCT in the last 72 hours. Urinalysis    Component Value Date/Time   COLORURINE YELLOW 08/02/2020 1645   APPEARANCEUR CLEAR 08/02/2020 1645   LABSPEC 1.015 08/02/2020 1645   PHURINE 5.5 08/02/2020 1645   GLUCOSEU NEGATIVE 08/02/2020 1645   HGBUR TRACE (A) 08/02/2020 1645   BILIRUBINUR NEGATIVE 08/02/2020 1645   KETONESUR NEGATIVE 08/02/2020 1645   PROTEINUR NEGATIVE 08/02/2020  1645   NITRITE NEGATIVE 08/02/2020 1645   LEUKOCYTESUR  NEGATIVE 08/02/2020 1645   Sepsis Labs Invalid input(s): PROCALCITONIN,  WBC,  LACTICIDVEN Microbiology Recent Results (from the past 240 hour(s))  Culture, blood (routine x 2)     Status: Abnormal   Collection Time: 08/02/20  1:42 PM   Specimen: BLOOD RIGHT ARM  Result Value Ref Range Status   Specimen Description   Final    BLOOD RIGHT ARM Performed at [facility], [address] [place], [place] [address]    Special Requests   Final    BOTTLES DRAWN AEROBIC AND ANAEROBIC Blood Culture results may not be optimal due to an inadequate volume of blood received in culture bottles Performed at [facility], [address] [place], [place] [address]    Culture  Setup Time   Final    GRAM POSITIVE COCCI IN CLUSTERS AEROBIC BOTTLE ONLY CRITICAL RESULT CALLED TO, READ BACK BY AND VERIFIED WITH: PHARM D J.GADHIA AT 2000 ON 08/03/2020 BY T.SAAD    Culture (A)  Final    STAPHYLOCOCCUS HOMINIS THE SIGNIFICANCE OF ISOLATING THIS ORGANISM FROM A SINGLE SET OF BLOOD CULTURES WHEN MULTIPLE SETS ARE DRAWN IS UNCERTAIN. PLEASE NOTIFY THE MICROBIOLOGY DEPARTMENT WITHIN ONE WEEK IF SPECIATION AND SENSITIVITIES ARE REQUIRED. Performed at [facility], [address] [place], [place] [address]    Report Status 08/04/2020 FINAL  Final  Blood Culture ID Panel (Reflexed)     Status: Abnormal   Collection Time: 08/02/20  1:42 PM  Result Value Ref Range Status   Enterococcus faecalis NOT DETECTED NOT DETECTED Final   Enterococcus Faecium NOT DETECTED NOT DETECTED Final   Listeria monocytogenes NOT DETECTED NOT DETECTED Final   Staphylococcus species DETECTED (A) NOT DETECTED Final    Comment: CRITICAL RESULT CALLED TO, READ BACK BY AND VERIFIED WITH: PHARM D J.JADHIA AT 2000 ON 08/03/2020 BY T.SAAD    Staphylococcus aureus (BCID) NOT DETECTED NOT DETECTED Final   Staphylococcus epidermidis NOT DETECTED NOT DETECTED Final   Staphylococcus lugdunensis NOT DETECTED NOT  DETECTED Final   Streptococcus species NOT DETECTED NOT DETECTED Final   Streptococcus agalactiae NOT DETECTED NOT DETECTED Final   Streptococcus pneumoniae NOT DETECTED NOT DETECTED Final   Streptococcus pyogenes NOT DETECTED NOT DETECTED Final   A.calcoaceticus-baumannii NOT DETECTED NOT DETECTED Final   Bacteroides fragilis NOT DETECTED NOT DETECTED Final   Enterobacterales NOT DETECTED NOT DETECTED Final   Enterobacter cloacae complex NOT DETECTED NOT DETECTED Final   Escherichia coli NOT DETECTED NOT DETECTED Final   Klebsiella aerogenes NOT DETECTED NOT DETECTED Final   Klebsiella oxytoca NOT DETECTED NOT DETECTED Final   Klebsiella pneumoniae NOT DETECTED NOT DETECTED Final   Proteus species NOT DETECTED NOT DETECTED Final   Salmonella species NOT DETECTED NOT DETECTED Final   Serratia marcescens NOT DETECTED NOT DETECTED Final   Haemophilus influenzae NOT DETECTED NOT DETECTED Final   Neisseria meningitidis NOT DETECTED NOT DETECTED Final   Pseudomonas aeruginosa NOT DETECTED NOT DETECTED Final   Stenotrophomonas maltophilia NOT DETECTED NOT DETECTED Final   Candida albicans NOT DETECTED NOT DETECTED Final   Candida auris NOT DETECTED NOT DETECTED Final   Candida glabrata NOT DETECTED NOT DETECTED Final   Candida krusei NOT DETECTED NOT DETECTED Final   Candida parapsilosis NOT DETECTED NOT DETECTED Final   Candida tropicalis NOT DETECTED NOT DETECTED Final   Cryptococcus neoformans/gattii NOT DETECTED NOT DETECTED Final    Comment:  Performed at [facility], [address] [place], [place] [address]  Culture, blood (routine x 2)     Status: None (Preliminary result)   Collection Time: 08/02/20  4:30 PM   Specimen: BLOOD LEFT ARM  Result Value Ref Range Status   Specimen Description   Final    BLOOD LEFT ARM Performed at [facility], [address] [place], [place] [address]    Special Requests   Final    BOTTLES DRAWN AEROBIC AND ANAEROBIC Blood  Culture adequate volume Performed at [facility], [address] [place], [place] [address]    Culture   Final    NO GROWTH 2 DAYS Performed at [facility], [address] [place], [place] [address]    Report Status PENDING  Incomplete  Urine culture     Status: Abnormal   Collection Time: 08/02/20  4:45 PM   Specimen: Urine, Random  Result Value Ref Range Status   Specimen Description   Final    URINE, RANDOM Performed at [facility], [address] [place], [place] [address]    Special Requests   Final    NONE Performed at [facility], [address] [place], [place] [address]    Culture MULTIPLE SPECIES PRESENT, SUGGEST RECOLLECTION (A)  Final   Report Status 08/04/2020 FINAL  Final  Resp Panel by RT-PCR (Flu A&B, Covid) Nasopharyngeal Swab     Status: None   Collection Time: 08/02/20  5:07 PM   Specimen: Nasopharyngeal Swab; Nasopharyngeal(NP) swabs in vial transport medium  Result Value Ref Range Status   SARS Coronavirus 2 by RT PCR NEGATIVE NEGATIVE Final    Comment: (NOTE) SARS-CoV-2 target nucleic acids are NOT DETECTED.  The SARS-CoV-2 RNA is generally detectable in upper respiratory specimens during the acute phase of infection. The lowest concentration of SARS-CoV-2 viral copies this assay can detect is 138 copies/mL. A negative result does not preclude SARS-Cov-2 infection and should not be used as the sole basis for treatment or other patient management decisions. A negative result may occur with  improper specimen collection/handling, submission of specimen other than nasopharyngeal swab, presence of viral mutation(s) within the areas targeted by this assay, and inadequate number of viral copies(<138 copies/mL). A negative result must be combined with clinical observations, patient history, and epidemiological information. The expected result is Negative.  Fact Sheet for Patients:   BloggerCourse.com  Fact Sheet for Healthcare Providers:  SeriousBroker.it  This test is no t yet approved or cleared by the [place] FDA and  has been authorized for detection and/or diagnosis of SARS-CoV-2 by FDA under an Emergency Use Authorization (EUA). This EUA will remain  in effect (meaning this test can be used) for the duration of the COVID-19 declaration under Section 564(b)(1) of the Act, 21 U.S.C.section 360bbb-3(b)(1), unless the authorization is terminated  or revoked sooner.       Influenza A by PCR NEGATIVE NEGATIVE Final   Influenza B by PCR NEGATIVE NEGATIVE Final    Comment: (NOTE) The Xpert Xpress SARS-CoV-2/FLU/RSV plus assay is intended as an aid in the diagnosis of influenza from Nasopharyngeal swab specimens and should not be used as a sole basis for treatment. Nasal washings and aspirates are unacceptable for Xpert Xpress SARS-CoV-2/FLU/RSV testing.  Fact Sheet for Patients: BloggerCourse.com  Fact Sheet for Healthcare Providers: SeriousBroker.it  This test is not yet approved or cleared by the [place]  FDA and has been authorized for detection and/or diagnosis of SARS-CoV-2 by FDA under an Emergency Use Authorization (EUA). This EUA will remain in effect (meaning this test can be used) for the duration of the COVID-19 declaration under Section 564(b)(1) of the Act, 21 U.S.C. section 360bbb-3(b)(1), unless the authorization is terminated or revoked.  Performed at [facility], [address], [place], [place] [address]     Please note: You were cared for by a hospitalist during your hospital stay. Once you are discharged, your primary care physician will handle any further medical issues. Please note that NO REFILLS for any discharge medications will be authorized once you are discharged, as it is imperative that you return to  your primary care physician (or establish a relationship with a primary care physician if you do not have one) for your post hospital discharge needs so that they can reassess your need for medications and monitor your lab values.    Time coordinating discharge: 40 minutes  SIGNED:   Burnadette Pop, MD  [facility] 08/04/2020, 1:21 PM Pager [phone]  If 7PM-7AM, please contact night-coverage [email or web address] Password [email or web address]

## 2020-08-04 NOTE — Plan of Care (Signed)
[facility]) is involved with the patient's care.  This program provides DME, insurance, PCP, Transportation, etc.  Janet(with [facility]) is attempting to reach patient's daughter to transport patient from hospital to home.

## 2020-08-04 NOTE — Evaluation (Signed)
Occupational Therapy Evaluation Patient Details Name: Kenneth Brandt MRN: [id number] DOB: 04-16-1935 Today's Date: 08/04/2020    History of Present Illness 85 y.o. male with medical history significant of chronic back pain, dementia, HTN, PAD.  Pt presents to the ED with generalized weakness for past several days. CT imaging showed right middle lobe pneumonia.  Patient was admitted for community acquired pneumonia management.   Clinical Impression   Pt admitted with the above diagnoses and presents with below problem list. Pt will benefit from continued acute OT to address the below listed deficits and maximize independence with basic ADLs prior to d/c home. PTA pt reports he was independent with basic ADLs (no family present to confirm), [facility] aide provides house keeping assistance. Pt currently min guard A with LB ADLs and functional transfers/mobility. VSS on RA. Tolerated session well.      Follow Up Recommendations  Home health OT;Supervision - Intermittent (OOB/mobility)    Equipment Recommendations  None recommended by OT    Recommendations for Other Services       Precautions / Restrictions Restrictions Weight Bearing Restrictions: No      Mobility Bed Mobility Overal bed mobility: Needs Assistance Bed Mobility: Supine to Sit     Supine to sit: Supervision;HOB elevated     General bed mobility comments: extra time and effort, no physical assist    Transfers Overall transfer level: Needs assistance Equipment used: None Transfers: Sit to/from Stand Sit to Stand: Min guard         General transfer comment: to/from EOB and recliner    Balance Overall balance assessment: Needs assistance Sitting-balance support: No upper extremity supported;Feet supported Sitting balance-Leahy Scale: Good     Standing balance support: No upper extremity supported Standing balance-Leahy Scale: Fair Standing balance comment: single extremity support with higher level balance  challenge (ex. sharp turns).                           ADL either performed or assessed with clinical judgement   ADL Overall ADL's : Needs assistance/impaired Eating/Feeding: Set up;Sitting   Grooming: Min guard;Standing   Upper Body Bathing: Set up;Sitting   Lower Body Bathing: Min guard;Sit to/from stand   Upper Body Dressing : Set up;Sitting   Lower Body Dressing: Min guard;Sit to/from stand   Toilet Transfer: Min guard;Ambulation   Toileting- Clothing Manipulation and Hygiene: Min guard;Sit to/from stand   Tub/ Shower Transfer: Tub transfer;Min guard;Ambulation;Shower seat;Grab bars   Functional mobility during ADLs: Min guard General ADL Comments: Pt completed in room functional mobility at min guard level. VSS on RA.      Vision         Perception     Praxis      Pertinent Vitals/Pain Pain Assessment: Faces Faces Pain Scale: Hurts a little bit Pain Location: flat affect. unspecified Pain Intervention(s): Monitored during session     Hand Dominance     Extremity/Trunk Assessment Upper Extremity Assessment Upper Extremity Assessment: Generalized weakness;Overall [facility] for tasks assessed   Lower Extremity Assessment Lower Extremity Assessment: Defer to PT evaluation       Communication Communication Communication: No difficulties   Cognition Arousal/Alertness: Awake/alert Behavior During Therapy: Flat affect Overall Cognitive Status: History of cognitive impairments - at baseline                                 General Comments:  h/o dementia per chart review. Pt able to answer questions appropriately. No family present to confirm though.   General Comments  VSS on RA    Exercises     Shoulder Instructions      Home Living Family/patient expects to be discharged to:: Private residence Living Arrangements: Spouse/significant other Available Help at Discharge: Family;Personal care attendant;Available 24 hours/day Type  of Home: House Home Access: Level entry     Home Layout: One level     Bathroom Shower/Tub: Tub/shower unit         Home Equipment: Walker - 4 wheels;Cane - single point;Shower seat;Grab bars - tub/shower   Additional Comments: lives with spouse whose health is often poorly per pt report. HH aide 5 days a week "between the 2 of [place]." Aide provides housekeeping assistance. Pt reports he and spouse take turns with meal prep depending on how they're feeling, groceries are delivered.       Prior Functioning/Environment Level of Independence: Needs assistance  Gait / Transfers Assistance Needed: per reports he does not currently use AD ADL's / Homemaking Assistance Needed: Aide assist with housekeeping. pt reports he is independent with basic ADLs.    Comments: noted h/o dementia, no family present to confirm PLOF.         OT Problem List: Decreased activity tolerance;Impaired balance (sitting and/or standing);Decreased knowledge of use of DME or AE;Decreased knowledge of precautions;Pain      OT Treatment/Interventions: Self-care/ADL training;Therapeutic exercise;Energy conservation;DME and/or AE instruction;Therapeutic activities;Patient/family education;Balance training    OT Goals(Current goals can be found in the care plan section) Acute Rehab OT Goals Patient Stated Goal: not stated OT Goal Formulation: With patient Time For Goal Achievement: 08/18/20 Potential to Achieve Goals: Good ADL Goals Pt Will Perform Grooming: with modified independence;standing Pt Will Transfer to Toilet: with modified independence;ambulating Pt Will Perform Toileting - Clothing Manipulation and hygiene: with modified independence;sit to/from stand Pt Will Perform Tub/Shower Transfer: Tub transfer;with modified independence;ambulating;shower seat;grab bars  OT Frequency: Min 2X/week   Barriers to D/C:            Co-evaluation              AM-PAC OT "6 Clicks" Daily Activity      Outcome Measure Help from another person eating meals?: None Help from another person taking care of personal grooming?: None Help from another person toileting, which includes using toliet, bedpan, or urinal?: None Help from another person bathing (including washing, rinsing, drying)?: A Little Help from another person to put on and taking off regular upper body clothing?: None Help from another person to put on and taking off regular lower body clothing?: None 6 Click Score: 23   End of Session Nurse Communication: Mobility status;Other (comment) (NT: pt up in recliner, mobilized well in room, VSS on RA)  Activity Tolerance: Patient tolerated treatment well Patient left: in chair;with call bell/phone within reach  OT Visit Diagnosis: Unsteadiness on feet (R26.81);Muscle weakness (generalized) (M62.81)                Time: 8366-2947 OT Time Calculation (min): 19 min Charges:  OT General Charges $OT Visit: 1 Visit OT Evaluation $OT Eval Low Complexity: 1 Low  Raynald Kemp, OT Acute Rehabilitation Services Pager: [phone] Office: [phone]   Pilar Grammes 08/04/2020, 12:51 PM

## 2020-08-04 NOTE — Progress Notes (Signed)
Patient has orders for Discharge.  Received an Order from Dr. Renford Dills that he made a couple attempts to contact the patient's daughter but was unsuccessful. (Per Patient's Request).  Marylu Lund from [facility]) spoke with this Clinical research associate and informed this Clinical research associate that the program sets up DME, rides, PCP, insurance and other needs for the elderly.  Marylu Lund asked if there were needs that the patient needed at this time.  Informed her that we were attempting to reach the daughter.  Marylu Lund stated that she would reach out to the daughter and notify me.  Will review discharge instructions with the patient and will reiterate them with the daughter and/or spouse.

## 2020-08-06 NOTE — Progress Notes (Signed)
Received a call from microbiology regarding blood culture obtained from December 4 which showed 1 out of 4 bottles growing gram variable rods, case discussed with infectious disease Dr. Luciana Axe who advise this is likely contamination.

## 2020-08-07 LAB — BLOOD CULTURE ID PANEL (REFLEXED) - BCID2

## 2020-08-11 LAB — CULTURE, BLOOD (ROUTINE X 2): Special Requests: ADEQUATE

## 2020-09-11 ENCOUNTER — Encounter: Payer: Self-pay | Admitting: Internal Medicine

## 2020-09-11 ENCOUNTER — Ambulatory Visit (INDEPENDENT_AMBULATORY_CARE_PROVIDER_SITE_OTHER): Payer: Medicare (Managed Care) | Admitting: Internal Medicine

## 2020-09-11 ENCOUNTER — Telehealth: Payer: Self-pay | Admitting: Internal Medicine

## 2020-09-11 VITALS — BP 142/70 | HR 66 | Ht 67.0 in | Wt 230.8 lb

## 2020-09-11 DIAGNOSIS — K224 Dyskinesia of esophagus: Secondary | ICD-10-CM | POA: Diagnosis not present

## 2020-09-11 DIAGNOSIS — K5901 Slow transit constipation: Secondary | ICD-10-CM | POA: Diagnosis not present

## 2020-09-11 DIAGNOSIS — R1319 Other dysphagia: Secondary | ICD-10-CM

## 2020-09-11 DIAGNOSIS — R112 Nausea with vomiting, unspecified: Secondary | ICD-10-CM

## 2020-09-11 MED ORDER — PROMETHAZINE HCL 25 MG RE SUPP: 25.0000 mg | Freq: Four times a day (QID) | RECTAL | 0 refills | Status: DC | PRN

## 2020-09-11 NOTE — Patient Instructions (Addendum)
We have sent the following medications to your pharmacy for you to pick up at your convenience: Phenergan suppositories.  Faxed to [facility].   Dr. Marina Goodell has recommended a Gastric emptying scan.  I left a message for [facility] to call me back to arrange and I will call you with the details once they return my call.   You have been scheduled for a gastric emptying scan at [facility] on  at . Please arrive at least 15 minutes prior to your appointment for registration. Please make certain not to have anything to eat or drink after midnight the night before your test. Hold all stomach medications (ex: Zofran, phenergan, Reglan) 48 hours prior to your test. If you need to reschedule your appointment, please contact radiology scheduling at [phone]. _____________________________________________________________________ A gastric-emptying study measures how long it takes for food to move through your stomach. There are several ways to measure stomach emptying. In the most common test, you eat food that contains a small amount of radioactive material. A scanner that detects the movement of the radioactive material is placed over your abdomen to monitor the rate at which food leaves your stomach. This test normally takes about 4 hours to complete. _____________________________________________________________________     If you are age 84 or older, your body mass index should be between 23-30. Your Body mass index is 36.15 kg/m. If this is out of the aforementioned range listed, please consider follow up with your Primary Care Provider.  If you are age 20 or younger, your body mass index should be between 19-25. Your Body mass index is 36.15 kg/m. If this is out of the aformentioned range listed, please consider follow up with your Primary Care Provider.    Due to recent changes in healthcare laws, you may see the results of your imaging and laboratory studies on MyChart before your  provider has had a chance to review them.  We understand that in some cases there may be results that are confusing or concerning to you. Not all laboratory results come back in the same time frame and the provider may be waiting for multiple results in order to interpret others.  Please give [place] 48 hours in order for your provider to thoroughly review all the results before contacting the office for clarification of your results.

## 2020-09-11 NOTE — Telephone Encounter (Signed)
Patient has been scheduled for a GES on 10/01/20 at [facility] hospital.  Patient to arrive at 9:30 for a 10:00.  Will need to be 6 hours NPO and hold all GI meds after midnight.   Left message for patient to call back   I updated Lillia Abed with [facility] with appointment details.

## 2020-09-11 NOTE — Telephone Encounter (Signed)
Inbound call from [facility] returning your call in regards to scheduling patient.  Also left fax number [phone] for orders to be faxed to.

## 2020-09-11 NOTE — Progress Notes (Signed)
HISTORY OF PRESENT ILLNESS:  Kenneth Brandt is a 86 y.o. 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  He is accompanied by his daughter.  He reports several year history of intermittent problems with dysphagia to liquids and solids.  He did undergo evaluation in the form of barium esophagram which revealed dysmotility and decreased distensibility of the distal esophagus which did not allow passage of a barium tablet.  Thus, he subsequently underwent upper endoscopy with Dr. Evette Cristal of [facility] November 21, 2019.  Examination was entirely normal.  He also reports episodic problems with nausea and vomiting.  Often dry heaves.  May occur once every 1 to 2 months.  6 or 8 episodes per year.  Last a day or 2.  Has been given antiemetics in the past with variable success.  These have included Phenergan and Zofran.  In between episodes, he seems to do well.  He does have chronic constipation for which he takes MiraLAX.  Of importance, he has been on chronic narcotics in the form of sustained-release morphine as well as as needed morphine for chronic back pain.  Patient was diagnosed with pneumonia in December.  This was noted on CT scan of the abdomen and chest.  Blood work from December reveals hemoglobin 11.3.  Does take chronic pantoprazole for history of GERD.  This helps.  He has completed his COVID vaccination series  REVIEW OF SYSTEMS:  All non-GI ROS negative unless otherwise stated in the HPI except for arthritis, back pain, anxiety  Past Medical History:  Diagnosis Date  . Anxiety   . Arthritis   . Chronic back pain   . Dementia (HCC)   . Depression   . Dyspnea    occasionally  . Essential tremor   . GERD (gastroesophageal reflux disease)   . Hepatitis    A -treated  . HSV infection   . Hypercholesteremia   . Hypertension   . Hypothyroid   .  Neuromuscular disorder (HCC)   . PAD (peripheral artery disease) (HCC)   . Seasonal allergies   . Wears glasses   . Wears partial dentures    upper and lower    Past Surgical History:  Procedure Laterality Date  . ANKLE FRACTURE SURGERY Right   . APPENDECTOMY     remote  . BACK SURGERY  2009, 2011   laminectomies  . BIOPSY  11/21/2019   Procedure: BIOPSY;  Surgeon: Graylin Shiver, MD;  Location: [facility];  Service: Endoscopy;;  . CARDIOVASCULAR STRESS TEST     04/25/15 Nuclear stress test [facility]): No definite evidence for inducible ischemia or infarct, EF 74%  . CATARACT EXTRACTION Bilateral   . CHOLECYSTECTOMY N/A 02/14/2020   Procedure: LAPAROSCOPIC CHOLECYSTECTOMY;  Surgeon: Harriette Bouillon, MD;  Location: [facility] OR;  Service: General;  Laterality: N/A;  . ESOPHAGOGASTRODUODENOSCOPY (EGD) WITH PROPOFOL N/A 11/21/2019   Procedure: ESOPHAGOGASTRODUODENOSCOPY (EGD) WITH PROPOFOL;  Surgeon: Graylin Shiver, MD;  Location: [facility];  Service: Endoscopy;  Laterality: N/A;  . IR KYPHO LUMBAR INC FX REDUCE BONE BX UNI/BIL CANNULATION INC/IMAGING  03/14/2018  . IR SACROPLASTY BILATERAL  08/18/2018  . REPLACEMENT TOTAL KNEE Right 2012  . RETINAL DETACHMENT SURGERY Right   . TONSILLECTOMY    . TOTAL KNEE ARTHROPLASTY Left 02/22/2017  . TOTAL KNEE ARTHROPLASTY Left 02/22/2017   Procedure: LEFT TOTAL KNEE ARTHROPLASTY;  Surgeon: [name],  Jewel Baize, MD;  Location: [facility] OR;  Service: Orthopedics;  Laterality: Left;    Social History [employer]  reports that he has never smoked. He has never used smokeless tobacco. He reports that he does not drink alcohol and does not use drugs.  family history includes CAD in his brother and father; Cancer in his mother and another family member; Pneumonia in his mother.  Allergies  Allergen Reactions  . Lactose Intolerance (Gi) Other (See Comments)    Gi upset        PHYSICAL EXAMINATION: Vital signs: BP (!) 142/70 (BP Location: Left  Arm, Patient Position: Sitting)   Pulse 66   Ht 5\' 7"  (1.702 m)   Wt 230 lb 12.8 oz (104.7 kg)   SpO2 91%   BMI 36.15 kg/m   Constitutional: generally well-appearing, no acute distress Psychiatric: alert and oriented x3, cooperative Eyes: extraocular movements intact, anicteric, conjunctiva pink Mouth: oral pharynx moist, no lesions Neck: supple no lymphadenopathy Cardiovascular: heart regular rate and rhythm, no murmur Lungs: clear to auscultation bilaterally Abdomen: soft, obese, nontender, nondistended, no obvious ascites, no peritoneal signs, normal bowel sounds, no organomegaly Rectal: Omitted Extremities: no clubbing, cyanosis, or lower extremity edema bilaterally Skin: no lesions on visible extremities Neuro: No focal deficits.  Cranial nerves intact  ASSESSMENT:  1.  Chronic dysphagia.  This is secondary to esophageal dysmotility.  The dysmotility secondary to chronic narcotics, almost certainly.  Normal upper endoscopy for the same March 2021 2.  Intermittent nausea with vomiting.  Again, I feel this is secondary to products with associated GI disturbance 3.  Chronic constipation.  Also psychoeducation narcotics 4.  Medical problems   PLAN:  1.  Detailed discussion today reviewing the effects of narcotics on GI motility how this may explain his symptom complex. 2.  Prescribe Phenergan suppositories to be used as needed for problems with episodic nausea vomiting.  Medication risks reviewed 3.  Schedule solid-phase gastric emptying scan to assess for any significant element of gastroparesis.  Apparently this needs to be scheduled by his healthcare provider group (PATIENT). 4.  Continue PPI 5.  Resume general medical care with PCP A total time of 45 minutes was spent preparing to see the patient, reviewing x-rays, test, blood work, endoscopy report.  Also obtaining comprehensive history and performing comprehensive physical exam.  Counseling the patient and his daughter  regarding his above listed issues.  Ordering medications and advanced radiology test.  Documenting clinical information in the health record.

## 2020-09-11 NOTE — Telephone Encounter (Signed)
Attempted to reach Kenneth Brandt.  Orders faxed. I asked that she call me back with instructions or the authorization to schedule GES with the patient.

## 2020-09-12 NOTE — Telephone Encounter (Signed)
Patient notified of the scan details. Records and instructions mailed to the patient and a copy sent to Dr. Dorothe Pea and Lillia Abed at [facility] to [phone]

## 2020-10-01 ENCOUNTER — Ambulatory Visit (HOSPITAL_COMMUNITY)
Admission: RE | Admit: 2020-10-01 | Discharge: 2020-10-01 | Disposition: A | Discharge status: Home / Self Care | Payer: Medicare (Managed Care) | Source: Ambulatory Visit | Attending: Internal Medicine | Admitting: Internal Medicine

## 2020-10-01 ENCOUNTER — Encounter (HOSPITAL_COMMUNITY): Payer: Self-pay

## 2020-10-01 ENCOUNTER — Other Ambulatory Visit: Payer: Self-pay

## 2020-10-01 DIAGNOSIS — R112 Nausea with vomiting, unspecified: Secondary | ICD-10-CM | POA: Insufficient documentation

## 2020-10-01 MED ORDER — TECHNETIUM TC 99M SULFUR COLLOID: 2.0000 | Freq: Once | INTRAVENOUS | Status: DC | PRN

## 2020-11-05 ENCOUNTER — Other Ambulatory Visit: Payer: Self-pay

## 2020-11-05 ENCOUNTER — Encounter (HOSPITAL_COMMUNITY)
Admission: RE | Admit: 2020-11-05 | Discharge: 2020-11-05 | Disposition: A | Discharge status: Home / Self Care | Payer: Medicare (Managed Care) | Source: Ambulatory Visit | Attending: Internal Medicine | Admitting: Internal Medicine

## 2020-11-05 DIAGNOSIS — R112 Nausea with vomiting, unspecified: Secondary | ICD-10-CM | POA: Insufficient documentation

## 2020-11-05 MED ORDER — TECHNETIUM TC 99M SULFUR COLLOID
2.2000 | Freq: Once | INTRAVENOUS | Status: AC | PRN
  Administered 2020-11-05: 2.2 via INTRAVENOUS

## 2021-04-23 ENCOUNTER — Observation Stay (HOSPITAL_COMMUNITY)
Admission: EM | Admit: 2021-04-23 | Discharge: 2021-04-24 | Disposition: A | Discharge status: Home / Self Care | Payer: Medicare (Managed Care) | Attending: Internal Medicine | Admitting: Internal Medicine

## 2021-04-23 ENCOUNTER — Other Ambulatory Visit: Payer: Self-pay

## 2021-04-23 ENCOUNTER — Emergency Department (HOSPITAL_COMMUNITY): Payer: Medicare (Managed Care)

## 2021-04-23 ENCOUNTER — Encounter (HOSPITAL_COMMUNITY): Payer: Self-pay

## 2021-04-23 DIAGNOSIS — Z96653 Presence of artificial knee joint, bilateral: Secondary | ICD-10-CM | POA: Diagnosis not present

## 2021-04-23 DIAGNOSIS — Z7982 Long term (current) use of aspirin: Secondary | ICD-10-CM | POA: Diagnosis not present

## 2021-04-23 DIAGNOSIS — I10 Essential (primary) hypertension: Secondary | ICD-10-CM | POA: Diagnosis not present

## 2021-04-23 DIAGNOSIS — R778 Other specified abnormalities of plasma proteins: Secondary | ICD-10-CM | POA: Diagnosis not present

## 2021-04-23 DIAGNOSIS — Z20822 Contact with and (suspected) exposure to covid-19: Secondary | ICD-10-CM | POA: Diagnosis not present

## 2021-04-23 DIAGNOSIS — E039 Hypothyroidism, unspecified: Secondary | ICD-10-CM | POA: Diagnosis not present

## 2021-04-23 DIAGNOSIS — Z79899 Other long term (current) drug therapy: Secondary | ICD-10-CM | POA: Insufficient documentation

## 2021-04-23 DIAGNOSIS — F0391 Unspecified dementia with behavioral disturbance: Secondary | ICD-10-CM | POA: Diagnosis not present

## 2021-04-23 DIAGNOSIS — I4581 Long QT syndrome: Secondary | ICD-10-CM | POA: Diagnosis not present

## 2021-04-23 DIAGNOSIS — R7989 Other specified abnormal findings of blood chemistry: Secondary | ICD-10-CM

## 2021-04-23 DIAGNOSIS — R296 Repeated falls: Secondary | ICD-10-CM

## 2021-04-23 DIAGNOSIS — R531 Weakness: Secondary | ICD-10-CM | POA: Diagnosis present

## 2021-04-23 DIAGNOSIS — I214 Non-ST elevation (NSTEMI) myocardial infarction: Principal | ICD-10-CM | POA: Insufficient documentation

## 2021-04-23 DIAGNOSIS — I2699 Other pulmonary embolism without acute cor pulmonale: Secondary | ICD-10-CM | POA: Diagnosis not present

## 2021-04-23 LAB — COMPREHENSIVE METABOLIC PANEL
ALT: 12 U/L (ref 0–44)
AST: 33 U/L (ref 15–41)
Albumin: 3 g/dL — ABNORMAL LOW (ref 3.5–5.0)
Alkaline Phosphatase: 75 U/L (ref 38–126)
Anion gap: 6 (ref 5–15)
BUN: 12 mg/dL (ref 8–23)
CO2: 29 mmol/L (ref 22–32)
Calcium: 8.4 mg/dL — ABNORMAL LOW (ref 8.9–10.3)
Chloride: 102 mmol/L (ref 98–111)
Creatinine, Ser: 1.16 mg/dL (ref 0.61–1.24)
GFR, Estimated: >60 mL/min (ref >60)
Glucose, Bld: 109 mg/dL — ABNORMAL HIGH (ref 70–99)
Potassium: 4.7 mmol/L (ref 3.5–5.1)
Sodium: 137 mmol/L (ref 135–145)
Total Bilirubin: 1 mg/dL (ref 0.3–1.2)
Total Protein: 5.5 g/dL — ABNORMAL LOW (ref 6.5–8.1)

## 2021-04-23 LAB — CBC WITH DIFFERENTIAL/PLATELET
Abs Immature Granulocytes: 0.03 10*3/uL (ref 0.00–0.07)
Basophils Absolute: 0 10*3/uL (ref 0.0–0.1)
Basophils Relative: 0 %
Eosinophils Absolute: 0.5 10*3/uL (ref 0.0–0.5)
Eosinophils Relative: 6 %
HCT: 37.1 % — ABNORMAL LOW (ref 39.0–52.0)
Hemoglobin: 12.1 g/dL — ABNORMAL LOW (ref 13.0–17.0)
Immature Granulocytes: 0 %
Lymphocytes Relative: 25 %
Lymphs Abs: 2.2 10*3/uL (ref 0.7–4.0)
MCH: 33 pg (ref 26.0–34.0)
MCHC: 32.6 g/dL (ref 30.0–36.0)
MCV: 101.1 fL — ABNORMAL HIGH (ref 80.0–100.0)
Monocytes Absolute: 0.9 10*3/uL (ref 0.1–1.0)
Monocytes Relative: 11 %
Neutro Abs: 4.9 10*3/uL (ref 1.7–7.7)
Neutrophils Relative %: 58 %
Platelets: 184 10*3/uL (ref 150–400)
RBC: 3.67 MIL/uL — ABNORMAL LOW (ref 4.22–5.81)
RDW: 13.3 % (ref 11.5–15.5)
WBC: 8.5 10*3/uL (ref 4.0–10.5)
nRBC: 0 % (ref 0.0–0.2)

## 2021-04-23 LAB — TROPONIN I (HIGH SENSITIVITY)
Troponin I (High Sensitivity): 226 ng/L (ref <18)
Troponin I (High Sensitivity): 198 ng/L (ref <18)

## 2021-04-23 LAB — D-DIMER, QUANTITATIVE: D-Dimer, Quant: 2.27 ug/mL-FEU — ABNORMAL HIGH (ref 0.00–0.50)

## 2021-04-23 LAB — RESP PANEL BY RT-PCR (FLU A&B, COVID) ARPGX2
Influenza A by PCR: NEGATIVE
Influenza B by PCR: NEGATIVE
SARS Coronavirus 2 by RT PCR: NEGATIVE

## 2021-04-23 LAB — PROTIME-INR
INR: 1.1 (ref 0.8–1.2)
Prothrombin Time: 13.8 seconds (ref 11.4–15.2)

## 2021-04-23 MED ORDER — HEPARIN (PORCINE) 25000 UT/250ML-% IV SOLN
1400.0000 [IU]/h | INTRAVENOUS | Status: DC
  Administered 2021-04-23: 1400 [IU]/h via INTRAVENOUS
  Filled 2021-04-23: qty 250

## 2021-04-23 MED ORDER — IOHEXOL 350 MG/ML SOLN
75.0000 mL | Freq: Once | INTRAVENOUS | Status: AC | PRN
  Administered 2021-04-23: 75 mL via INTRAVENOUS

## 2021-04-23 MED ORDER — SODIUM CHLORIDE 0.9 % IV BOLUS
500.0000 mL | Freq: Once | INTRAVENOUS | Status: AC
  Administered 2021-04-23: 500 mL via INTRAVENOUS

## 2021-04-23 NOTE — ED Provider Notes (Signed)
[facility] EMERGENCY DEPARTMENT Provider Note   CSN: [id number] Arrival date & time: 04/23/21  1709     History Chief Complaint  Patient presents with   Weakness    Kenneth Brandt is a 86 y.o. male.  THORN DEMAS was found on the floor by his wife.  He was in bed at 9 AM, and at 2 PM he was found on the floor.  Per EMS, the patient had an O2 sat of 90% on arrival, and he was placed on 4 L of oxygen.  He does not use oxygen typically.  He states that he has no pain and feels fine aside from struggling with chronic constipation secondary to opioid use.  He did recently travel to [place], and he returned 2 nights ago.  Since arriving back home, he has complaint of feeling weak and lethargic.  He tells me that he thinks he might have passed out today.  Otherwise, he has no recollection of the event.    He is vaccinated for COVID-19 x2 and boosted x1.  The history is provided by the patient and the EMS personnel. History limited by: dementia- mild.  Fall This is a new problem. Episode onset: sometime between 9 am and 2 pm. Episode frequency: once. The problem has been resolved. Pertinent negatives include no chest pain, no abdominal pain, no headaches and no shortness of breath. Nothing aggravates the symptoms. Nothing relieves the symptoms. He has tried nothing for the symptoms. The treatment provided no relief.      Past Medical History:  Diagnosis Date   Anxiety    Arthritis    Chronic back pain    Dementia (HCC)    Depression    Dyspnea    occasionally   Essential tremor    GERD (gastroesophageal reflux disease)    Hepatitis    A -treated   HSV infection    Hypercholesteremia    Hypertension    Hypothyroid    Neuromuscular disorder (HCC)    PAD (peripheral artery disease) (HCC)    Seasonal allergies    Wears glasses    Wears partial dentures    upper and lower    Patient Active Problem List   Diagnosis Date Noted   Acute respiratory failure  with hypoxia (HCC) 08/03/2020   AKI (acute kidney injury) (HCC) 08/03/2020   Community acquired pneumonia of right middle lobe of lung 08/02/2020   Pain due to onychomycosis of toenails of both feet 04/04/2020   Porokeratosis 04/04/2020   Primary osteoarthritis of left knee 02/22/2017   Hyperlipidemia 02/01/2017   Vitamin D deficiency 02/01/2017   Peripheral artery disease (HCC) 02/01/2017   Chronic diarrhea 02/01/2017   Hypothyroidism 02/01/2017   Chronic low back pain 02/01/2017   Chronic pain 02/01/2017   Facial cellulitis 02/08/2016   Dental abscess 02/08/2016   Dementia with behavioral disturbance (HCC) 02/08/2016   Essential tremor 02/08/2016   Essential hypertension 02/08/2016    Past Surgical History:  Procedure Laterality Date   ANKLE FRACTURE SURGERY Right    APPENDECTOMY     remote   BACK SURGERY  2009, 2011   laminectomies   BIOPSY  11/21/2019   Procedure: BIOPSY;  Surgeon: Graylin Shiver, MD;  Location: [facility] ENDOSCOPY;  Service: Endoscopy;;   CARDIOVASCULAR STRESS TEST     04/25/15 Nuclear stress test [facility]): No definite evidence for inducible ischemia or infarct, EF 74%   CATARACT EXTRACTION Bilateral    CHOLECYSTECTOMY N/A 02/14/2020  Procedure: LAPAROSCOPIC CHOLECYSTECTOMY;  Surgeon: Harriette Bouillonornett, Thomas, MD;  Location: [facility] OR;  Service: General;  Laterality: N/A;   ESOPHAGOGASTRODUODENOSCOPY (EGD) WITH PROPOFOL N/A 11/21/2019   Procedure: ESOPHAGOGASTRODUODENOSCOPY (EGD) WITH PROPOFOL;  Surgeon: Graylin ShiverGanem, Salem F, MD;  Location: [facility] ENDOSCOPY;  Service: Endoscopy;  Laterality: N/A;   IR KYPHO LUMBAR INC FX REDUCE BONE BX UNI/BIL CANNULATION INC/IMAGING  03/14/2018   IR SACROPLASTY BILATERAL  08/18/2018   REPLACEMENT TOTAL KNEE Right 2012   RETINAL DETACHMENT SURGERY Right    TONSILLECTOMY     TOTAL KNEE ARTHROPLASTY Left 02/22/2017   TOTAL KNEE ARTHROPLASTY Left 02/22/2017   Procedure: LEFT TOTAL KNEE ARTHROPLASTY;  Surgeon: Sheral ApleyMurphy, Timothy D, MD;  Location: [facility]  OR;  Service: Orthopedics;  Laterality: Left;       Family History  Problem Relation Age of Onset   Cancer Mother    Pneumonia Mother    CAD Father    CAD Brother    Cancer Other    Stroke Neg Hx     Social History   Tobacco Use   Smoking status: Never   Smokeless tobacco: Never  Vaping Use   Vaping Use: Never used  Substance Use Topics   Alcohol use: No   Drug use: No    Home Medications Prior to Admission medications   Medication Sig Start Date End Date Taking? Authorizing Provider  aspirin EC 81 MG tablet Take 81 mg by mouth daily. Swallow whole.    [provider]  atorvastatin (LIPITOR) 20 MG tablet Take 20 mg by mouth daily.    [provider]  calcium-vitamin D (OSCAL WITH D) 500-200 MG-UNIT TABS tablet Take 1 tablet by mouth in the morning and at bedtime.    [provider]  DULoxetine (CYMBALTA) 60 MG capsule Take 60 mg by mouth daily.     [provider]  fexofenadine (ALLEGRA) 180 MG tablet Take 180 mg by mouth daily.    [provider]  guaiFENesin-dextromethorphan (ROBITUSSIN DM) 100-10 MG/5ML syrup Take 5 mLs by mouth every 4 (four) hours as needed for cough. 08/04/20   Burnadette PopAdhikari, Amrit, MD  ibandronate (BONIVA) 150 MG tablet Take 150 mg by mouth every 30 (thirty) days. Take in the morning with a full glass of water, on an empty stomach, and do not take anything else by mouth or lie down for the next 30 min.    [provider]  levothyroxine (SYNTHROID, LEVOTHROID) 75 MCG tablet Take 75 mcg by mouth daily before breakfast.    [provider]  LORazepam (ATIVAN) 0.5 MG tablet Take 0.5 mg by mouth at bedtime. sleep    [provider]  losartan (COZAAR) 50 MG tablet Take 1 tablet (50 mg total) by mouth every evening. 08/04/20   Burnadette PopAdhikari, Amrit, MD  morphine (MS CONTIN) 30 MG 12 hr tablet Take 30 mg by mouth every 12 (twelve) hours.    [provider]  morphine (MSIR) 15 MG tablet Take 15  mg by mouth every 4 (four) hours as needed for severe pain.    [provider]  pantoprazole (PROTONIX) 40 MG tablet Take 40 mg by mouth daily.    [provider]  polyethylene glycol (MIRALAX / GLYCOLAX) 17 g packet Take 17 g by mouth daily as needed for mild constipation.    [provider]  pregabalin (LYRICA) 75 MG capsule Take 75 mg by mouth 2 (two) times daily.     [provider]  promethazine (PHENERGAN) 25 MG suppository  Place 1 suppository (25 mg total) rectally every 6 (six) hours as needed for nausea or vomiting. 09/11/20   Hilarie Fredrickson, MD  torsemide (DEMADEX) 20 MG tablet Take 2 tablets (40 mg total) by mouth daily. 08/04/20   Burnadette Pop, MD  traZODone (DESYREL) 100 MG tablet Take 100 mg by mouth at bedtime.     [provider]    Allergies    Lactose intolerance (gi)  Review of Systems   Review of Systems  Constitutional:  Positive for fatigue. Negative for chills and fever.  HENT:  Negative for ear pain and sore throat.   Eyes:  Negative for pain and visual disturbance.  Respiratory:  Negative for cough and shortness of breath.   Cardiovascular:  Negative for chest pain and palpitations.  Gastrointestinal:  Negative for abdominal pain and vomiting.  Genitourinary:  Negative for dysuria and hematuria.  Musculoskeletal:  Negative for arthralgias and back pain.  Skin:  Negative for color change and rash.  Neurological:  Positive for weakness. Negative for seizures, syncope and headaches.  All other systems reviewed and are negative.  Physical Exam Updated Vital Signs BP (!) 99/50   Pulse 74   Temp 99.8 F (37.7 C) (Oral)   Resp 12   Ht 5\' 7"  (1.702 m)   Wt 104.7 kg   SpO2 100%   BMI 36.15 kg/m   Physical Exam Vitals and nursing note reviewed.  Constitutional:      Appearance: Normal appearance.  HENT:     Head: Normocephalic and atraumatic.  Eyes:     Extraocular Movements: Extraocular movements intact.      Conjunctiva/sclera: Conjunctivae normal.     Pupils: Pupils are equal, round, and reactive to light.  Cardiovascular:     Rate and Rhythm: Normal rate and regular rhythm.     Heart sounds: Normal heart sounds.  Pulmonary:     Effort: Pulmonary effort is normal.     Breath sounds: Rhonchi (mild, bases bilaterally) present.  Abdominal:     General: There is no distension.     Tenderness: There is no abdominal tenderness. There is no guarding.  Musculoskeletal:     Cervical back: Normal range of motion.     Right lower leg: Edema present.     Left lower leg: Edema present.     Comments: 2+ pitting edema to shins bilaterally  Skin:    General: Skin is warm and dry.  Neurological:     General: No focal deficit present.     Mental Status: He is alert and oriented to person, place, and time.     Cranial Nerves: No cranial nerve deficit.     Sensory: No sensory deficit.     Motor: No weakness.     Coordination: Coordination normal.  Psychiatric:        Mood and Affect: Mood normal.        Behavior: Behavior normal.    ED Results / Procedures / Treatments   Labs (all labs ordered are listed, but only abnormal results are displayed) Labs Reviewed  RESP PANEL BY RT-PCR (FLU A&B, COVID) ARPGX2  CBC WITH DIFFERENTIAL/PLATELET  BRAIN NATRIURETIC PEPTIDE  D-DIMER, QUANTITATIVE  COMPREHENSIVE METABOLIC PANEL  TROPONIN I (HIGH SENSITIVITY)    EKG EKG Interpretation  Date/Time:  Thursday April 23 2021 17:12:45 EDT Ventricular Rate:  77 PR Interval:    QRS Duration: 72 QT Interval:  442 QTC Calculation: 501 R Axis:   2 Text Interpretation: Atrial fibrillation Low  voltage, precordial leads Abnormal R-wave progression, early transition Prolonged QT interval A fib not noted on prior EKGs Confirmed by Pieter Partridge ([id number]) on 04/23/2021 5:15:24 PM  Radiology No results found.  Procedures .Critical Care  Date/Time: 04/24/2021 12:15 AM Performed by: Koleen Distance, MD Authorized by:  Koleen Distance, MD   Critical care provider statement:    Critical care time (minutes):  30   Critical care time was exclusive of:  Separately billable procedures and treating other patients and teaching time   Critical care was necessary to treat or prevent imminent or life-threatening deterioration of the following conditions:  Cardiac failure and respiratory failure   Critical care was time spent personally by me on the following activities:  Blood draw for specimens, development of treatment plan with patient or surrogate, discussions with consultants, evaluation of patient's response to treatment, examination of patient, obtaining history from patient or surrogate, ordering and performing treatments and interventions, ordering and review of laboratory studies, ordering and review of radiographic studies, pulse oximetry, re-evaluation of patient's condition and review of old charts   I assumed direction of critical care for this patient from another provider in my specialty: no     Care discussed with: admitting provider     Medications Ordered in ED Medications  heparin ADULT infusion 100 units/mL (25000 units/228mL) (1,400 Units/hr Intravenous New Bag/Given 04/23/21 2209)  sodium chloride 0.9 % bolus 500 mL (500 mLs Intravenous Bolus 04/23/21 1804)  iohexol (OMNIPAQUE) 350 MG/ML injection 75 mL (75 mLs Intravenous Contrast Given 04/23/21 2234)    ED Course  I have reviewed the triage vital signs and the nursing notes.  Pertinent labs & imaging results that were available during my care of the patient were reviewed by me and considered in my medical decision making (see chart for details).  Clinical Course as of 04/24/21 0015  Thu Apr 23, 2021  2328 I spoke with cardiology (Dr. Noemi Chapel). Recommends med admit. [AW]  Fri Apr 24, 2021  0013 I spoke with IM on call ([place]) who will admit the patient. [AW]    Clinical Course User Index [AW] Koleen Distance, MD   MDM  Rules/Calculators/A&P                           Buren Kos presented after being found on the floor near his bed.  He had an unknown downtime, and he was unable to recall anything other than thinking maybe he passed out.  He was evaluated for evidence of cardiopulmonary pathology because he has a new oxygen requirement of 4 L.  I considered COVID-19 or complications related to COVID-19 including myocarditis or pericarditis.  I considered pneumonia, ACS, CHF.  His troponins were slightly elevated but downtrending.  He was started on heparin IV, and I reached out to cardiology.  Ultimately, the decision was made to admit him to the medicine service given that the differential was wider than simply a cardiac process. Final Clinical Impression(s) / ED Diagnoses Final diagnoses:  NSTEMI (non-ST elevated myocardial infarction) [facility])    Rx / DC Orders ED Discharge Orders     None        Koleen Distance, MD 04/24/21 [phone]

## 2021-04-23 NOTE — Consult Note (Signed)
Cardiology Consultation:   Patient ID: Kenneth Brandt MRN: [id number]; DOB: [birth year]  Admit date: 04/23/2021 Date of Consult: 04/23/2021  PCP:  Oneita HurtPcp, No   [facility] Providers Cardiologist:  None        Patient Profile:   Kenneth Brandt is a 86 y.o. male with a hx of dementia, anxiety, HTN, dyslipidemia, PAD, hypothyroidism but no prior h/o CAD/HF or documented arrhythmia who is being seen 04/23/2021 for the evaluation of abnormal HS trop at the request of Dr. Pieter PartridgeAnna Brandt.  History of Present Illness:   Kenneth Brandt has the above hx and was brought to the ED earlier this evening after an episode of being found down this morning at home. Pt is not very clear on what happened, but his daughter is present to help with the history. Pt states he and his wife recently traveled to [place] for several weeks and returned home yesterday. They have been quite tired and slept in this AM. Pt's wife got up around 9am and pt was still asleep in bed. She came back to check on him around 2pm and found him in the floor asleep. Not clear how long he had been in the floor. Pt says he had not gotten up prior to the episode. He is not sure whether he tried to get up from bed and fell in the floor, or if he fell out of bed. He is not really sure what happened. It is not clear whether this represents a syncopal episode. Pt's daughter states that her mother did say he seemed a little lethargic the night before but w/o focal neuro complaints. Daughter states he seemed sleepy and confused when she arrived at the home today.  He has no cardiac complaints. He has not had any rest or exertional CP, SOB/DOE, palpitations, or any other significant cardiac issues or complaints. CT PE protocol was done and was negative for PE. No head CT was obtained by the ED. HS trop was checked and was minimally elevated at 226-->198.   Past Medical History:  Diagnosis Date   Anxiety    Arthritis    Chronic back pain     Dementia (HCC)    Depression    Dyspnea    occasionally   Essential tremor    GERD (gastroesophageal reflux disease)    Hepatitis    A -treated   HSV infection    Hypercholesteremia    Hypertension    Hypothyroid    Neuromuscular disorder (HCC)    PAD (peripheral artery disease) (HCC)    Seasonal allergies    Wears glasses    Wears partial dentures    upper and lower    Past Surgical History:  Procedure Laterality Date   ANKLE FRACTURE SURGERY Right    APPENDECTOMY     remote   BACK SURGERY  2009, 2011   laminectomies   BIOPSY  11/21/2019   Procedure: BIOPSY;  Surgeon: Graylin ShiverGanem, Salem F, MD;  Location: [facility];  Service: Endoscopy;;   CARDIOVASCULAR STRESS TEST     04/25/15 Nuclear stress test [facility]): No definite evidence for inducible ischemia or infarct, EF 74%   CATARACT EXTRACTION Bilateral    CHOLECYSTECTOMY N/A 02/14/2020   Procedure: LAPAROSCOPIC CHOLECYSTECTOMY;  Surgeon: Harriette Bouillonornett, Thomas, MD;  Location: [facility] OR;  Service: General;  Laterality: N/A;   ESOPHAGOGASTRODUODENOSCOPY (EGD) WITH PROPOFOL N/A 11/21/2019   Procedure: ESOPHAGOGASTRODUODENOSCOPY (EGD) WITH PROPOFOL;  Surgeon: Graylin ShiverGanem, Salem F, MD;  Location: [facility];  Service: Endoscopy;  Laterality:  N/A;   IR KYPHO LUMBAR INC FX REDUCE BONE BX UNI/BIL CANNULATION INC/IMAGING  03/14/2018   IR SACROPLASTY BILATERAL  08/18/2018   REPLACEMENT TOTAL KNEE Right 2012   RETINAL DETACHMENT SURGERY Right    TONSILLECTOMY     TOTAL KNEE ARTHROPLASTY Left 02/22/2017   TOTAL KNEE ARTHROPLASTY Left 02/22/2017   Procedure: LEFT TOTAL KNEE ARTHROPLASTY;  Surgeon: Sheral Apley, MD;  Location: [facility];  Service: Orthopedics;  Laterality: Left;     Home Medications:  Prior to Admission medications   Medication Sig Start Date End Date Taking? Authorizing Provider  aspirin EC 81 MG tablet Take 81 mg by mouth daily. Swallow whole.    [provider]  atorvastatin (LIPITOR) 20 MG tablet Take 20 mg by mouth  daily.    [provider]  calcium-vitamin D (OSCAL WITH D) 500-200 MG-UNIT TABS tablet Take 1 tablet by mouth in the morning and at bedtime.    [provider]  DULoxetine (CYMBALTA) 60 MG capsule Take 60 mg by mouth daily.     [provider]  fexofenadine (ALLEGRA) 180 MG tablet Take 180 mg by mouth daily.    [provider]  guaiFENesin-dextromethorphan (ROBITUSSIN DM) 100-10 MG/5ML syrup Take 5 mLs by mouth every 4 (four) hours as needed for cough. 08/04/20   Kenneth Pop, MD  ibandronate (BONIVA) 150 MG tablet Take 150 mg by mouth every 30 (thirty) days. Take in the morning with a full glass of water, on an empty stomach, and do not take anything else by mouth or lie down for the next 30 min.    [provider]  levothyroxine (SYNTHROID, LEVOTHROID) 75 MCG tablet Take 75 mcg by mouth daily before breakfast.    [provider]  LORazepam (ATIVAN) 0.5 MG tablet Take 0.5 mg by mouth at bedtime. sleep    [provider]  losartan (COZAAR) 50 MG tablet Take 1 tablet (50 mg total) by mouth every evening. 08/04/20   Kenneth Pop, MD  morphine (MS CONTIN) 30 MG 12 hr tablet Take 30 mg by mouth every 12 (twelve) hours.    [provider]  morphine (MSIR) 15 MG tablet Take 15 mg by mouth every 4 (four) hours as needed for severe pain.    [provider]  pantoprazole (PROTONIX) 40 MG tablet Take 40 mg by mouth daily.    [provider]  polyethylene glycol (MIRALAX / GLYCOLAX) 17 g packet Take 17 g by mouth daily as needed for mild constipation.    [provider]  pregabalin (LYRICA) 75 MG capsule Take 75 mg by mouth 2 (two) times daily.     [provider]  promethazine (PHENERGAN) 25 MG suppository Place 1 suppository (25 mg total) rectally every 6 (six) hours as needed for nausea or vomiting. 09/11/20   Hilarie Fredrickson, MD  torsemide (DEMADEX) 20 MG tablet Take 2 tablets (40 mg total) by mouth  daily. 08/04/20   Kenneth Pop, MD  traZODone (DESYREL) 100 MG tablet Take 100 mg by mouth at bedtime.     [provider]    Inpatient Medications: Scheduled Meds:  Continuous Infusions:  heparin 1,400 Units/hr (04/23/21 2209)   PRN Meds:   Allergies:    Allergies  Allergen Reactions   Lactose Intolerance (Gi) Other (See Comments)    Gi upset    Social History:   Social History   Socioeconomic History   Marital status: Married    Spouse name: Beecher Mcardle  Number of children: 3   Years of education: college   Highest education level: Not on file  Occupational History   Occupation: retired    Comment: former Education officer, environmental, missionary  Tobacco Use   Smoking status: Never   Smokeless tobacco: Never  Vaping Use   Vaping Use: Never used  Substance and Sexual Activity   Alcohol use: No   Drug use: No   Sexual activity: Not Currently  Other Topics Concern   Not on file  Social History Narrative   Lives at home with wife, caregiver   Caffeine- coffee, 1 cup daily, coke occasionally   Social Determinants of Health   Financial Resource Strain: Not on file  Food Insecurity: Not on file  Transportation Needs: Not on file  Physical Activity: Not on file  Stress: Not on file  Social Connections: Not on file  Intimate Partner Violence: Not on file    Family History:    Family History  Problem Relation Age of Onset   Cancer Mother    Pneumonia Mother    CAD Father    CAD Brother    Cancer Other    Stroke Neg Hx      ROS:  Please see the history of present illness.   All other ROS reviewed and negative.     Physical Exam/Data:   Vitals:   04/23/21 1930 04/23/21 2000 04/23/21 2130 04/23/21 2200  BP: (!) 112/55 (!) 115/55 (!) 112/59 105/61  Pulse: 65 75 68 70  Resp: 14 13 13 13   Temp: 98.9 F (37.2 C)     TempSrc: Oral     SpO2: 98% 91% 91% 93%  Weight:      Height:       No intake or output data in the 24 hours ending 04/23/21 2354 Last 3 Weights  04/23/2021 09/11/2020 08/03/2020  Weight (lbs) 230 lb 13.2 oz 230 lb 12.8 oz 236 lb 6.4 oz  Weight (kg) 104.7 kg 104.69 kg 107.23 kg     Body mass index is 36.15 kg/m.  General:  Well nourished, well developed, in no acute distress HEENT: normal Lymph: no adenopathy Neck: no JVD Endocrine:  No thryomegaly Vascular: No carotid bruits; DP pulses 2+ bilaterally   Cardiac:  normal S1, S2; RRR; no murmur  Lungs:  clear to auscultation bilaterally, no wheezing, rhonchi or rales  Abd: soft, nontender, no hepatomegaly  Ext: no edema Musculoskeletal:  No deformities Skin: warm and dry  Neuro:  no obvious focal abnormalities noted Psych:  Normal affect   EKG:  The EKG was personally reviewed and demonstrates:  NSR with sinus arrhythmia, 1st deg AVB (computer says AF; this is not afib). QTc 14/12/2019 Telemetry:  Telemetry was personally reviewed and demonstrates:  NSR with sinus arrhythmia  Relevant CV Studies: TTE 12-15-17 Left ventricle: The cavity size was normal. Wall thickness was    increased in a pattern of mild LVH. Systolic function was normal.    The estimated ejection fraction was in the range of 60% to 65%.    Wall motion was normal; there were no regional wall motion    abnormalities. Doppler parameters are consistent with abnormal    left ventricular relaxation (grade 1 diastolic dysfunction).  - Aortic valve: There was no stenosis.  - Mitral valve: There was no significant regurgitation.  - Right ventricle: The cavity size was normal. Systolic function    was normal.  - Tricuspid valve: Peak RV-RA gradient (S): 31 mm Hg.  - Pulmonary  arteries: PA peak pressure: 34 mm Hg (S).  - Inferior vena cava: The vessel was normal in size. The    respirophasic diameter changes were in the normal range (>= 50%),    consistent with normal central venous pressure.   Impressions:   - Normal LV size with mild LV hypertrophy. EF 60-65%. Normal RV    size and systolic function. No significant  valvular    abnormalities.   Laboratory Data:  High Sensitivity Troponin:   Recent Labs  Lab 04/23/21 1727 04/23/21 1928  TROPONINIHS 226* 198*     Chemistry Recent Labs  Lab 04/23/21 1727  NA 137  K 4.7  CL 102  CO2 29  GLUCOSE 109*  BUN 12  CREATININE 1.16  CALCIUM 8.4*  GFRNONAA >60  ANIONGAP 6    Recent Labs  Lab 04/23/21 1727  PROT 5.5*  ALBUMIN 3.0*  AST 33  ALT 12  ALKPHOS 75  BILITOT 1.0   Hematology Recent Labs  Lab 04/23/21 1727  WBC 8.5  RBC 3.67*  HGB 12.1*  HCT 37.1*  MCV 101.1*  MCH 33.0  MCHC 32.6  RDW 13.3  PLT 184   BNPNo results for input(s): BNP, PROBNP in the last 168 hours.  DDimer  Recent Labs  Lab 04/23/21 1727  DDIMER 2.27*     Radiology/Studies:  CT Angio Chest PE W/Cm &/Or Wo Cm  Result Date: 04/23/2021 CLINICAL DATA:  Pulmonary embolus suspected with low to intermediate probability. Positive D-dimer. Recent travel from [place]. EXAM: CT ANGIOGRAPHY CHEST WITH CONTRAST TECHNIQUE: Multidetector CT imaging of the chest was performed using the standard protocol during bolus administration of intravenous contrast. Multiplanar CT image reconstructions and MIPs were obtained to evaluate the vascular anatomy. CONTRAST:  28mL OMNIPAQUE IOHEXOL 350 MG/ML SOLN COMPARISON:  08/02/2020 FINDINGS: Cardiovascular: Good opacification of the central and segmental pulmonary arteries. No focal filling defects. No evidence of significant pulmonary embolus. Normal heart size. No pericardial effusions. Coronary artery and aortic calcifications. No aortic aneurysm. Focal prominence of the inside the aortic arch probably representing residual infundibulum. No change. Mediastinum/Nodes: Residual material demonstrated in the esophagus without significant dilatation, likely dysmotility or reflux. Thyroid gland is unremarkable. No significant lymphadenopathy. Lungs/Pleura: Linear atelectasis in the lung bases. No focal consolidation. No pleural effusions. No  pneumothorax. Upper Abdomen: No acute abnormalities demonstrated in the visualized upper abdomen. Musculoskeletal: Old right rib fractures. Degenerative changes in the spine. Review of the MIP images confirms the above findings. IMPRESSION: 1. No evidence of significant pulmonary embolus. 2. Atelectasis in the lung bases. 3. Residual material in the esophagus likely indicating dysmotility or reflux. 4. Aortic atherosclerosis. Electronically Signed   By: Burman Nieves M.D.   On: 04/23/2021 22:49   DG Chest Port 1 View  Result Date: 04/23/2021 CLINICAL DATA:  New oxygen requirement, syncope EXAM: PORTABLE CHEST 1 VIEW COMPARISON:  08/28/2018 FINDINGS: Single frontal view of the chest demonstrates an unremarkable cardiac silhouette. Minimal scarring or subsegmental atelectasis within the right upper lobe abutting the minor fissure. No airspace disease, effusion, or pneumothorax. No acute bony abnormalities. IMPRESSION: 1. No acute intrathoracic process. Electronically Signed   By: Sharlet Salina M.D.   On: 04/23/2021 18:21     Assessment and Plan:   Abn HS trop: pt has no cardiac complaints or symptoms whatsoever. It is not clear whether he had true syncope. He has had no recent anginal sx and has no prior h/o CAD. Trop leak seems unlikely to be due to primary  plaque rupture; could consider non-invasive ischemia eval to rule it out if trop remains relatively flat. If trop rises significantly, could consider LHC. Would get updated TTE for further evaluation. Arrhythmia may be a playing some role; EKG read as AF, but actually shows sinus; NO AFIB has been documented. QTC however is prolonged, indicating elevated risk for other types of arrhythmia; see below. Prolonged QT interval: pt is on several medications that prolong QT interval. These may need to be adjusted or changed as QTc is >516ms, increasing risk for arrhythmia. Specific concern re: cymbalta, trazodone, and lyrica HTN/dyslipidemia: restart home  med regimen Non-cardiac problems: mgmt as per primary medical team   Risk Assessment/Risk Scores:     TIMI Risk Score for Unstable Angina or Non-ST Elevation MI:   The patient's TIMI risk score is 3, which indicates a 13% risk of all cause mortality, new or recurrent myocardial infarction or need for urgent revascularization in the next 14 days.          For questions or updates, please contact [facility] Please consult [email or web address] for contact info under    Signed, Precious Reel, MD, [facility]  04/23/2021 11:54 PM

## 2021-04-23 NOTE — ED Triage Notes (Signed)
Pt is alert and oriented x 4 walking talking continent baseline.  This morning pt was in bed at 9am and when wife woke up at 2pm the pt was on the floor.  Pt recently traveled to [place] for a month and returned Tuesday night since has had increased  weakness and lethargy per spouse.  Pt does not require O2 at baseline currently on 4L RA on EMS arrival 90%

## 2021-04-23 NOTE — ED Notes (Signed)
Nile Dear (Daughter) of [place] called for an update [phone]

## 2021-04-23 NOTE — ED Notes (Signed)
Patient back from CT.

## 2021-04-23 NOTE — Progress Notes (Signed)
ANTICOAGULATION CONSULT NOTE - Initial Consult  Pharmacy Consult for Heparin Indication: atrial fibrillation  Allergies  Allergen Reactions   Lactose Intolerance (Gi) Other (See Comments)    Gi upset    Patient Measurements: Height: 5\' 7"  (170.2 cm) Weight: 104.7 kg (230 lb 13.2 oz) IBW/kg (Calculated) : 66.1 Heparin Dosing Weight: 89.2 kg  Vital Signs: Temp: 98.9 F (37.2 C) (08/25 1930) Temp Source: Oral (08/25 1930) BP: 115/55 (08/25 2000) Pulse Rate: 75 (08/25 2000)  Labs: Recent Labs    04/23/21 1727  HGB 12.1*  HCT 37.1*  PLT 184  CREATININE 1.16  TROPONINIHS 226*    Estimated Creatinine Clearance: 54.6 mL/min (by C-G formula based on SCr of 1.16 mg/dL).   Medical History: Past Medical History:  Diagnosis Date   Anxiety    Arthritis    Chronic back pain    Dementia (HCC)    Depression    Dyspnea    occasionally   Essential tremor    GERD (gastroesophageal reflux disease)    Hepatitis    A -treated   HSV infection    Hypercholesteremia    Hypertension    Hypothyroid    Neuromuscular disorder (HCC)    PAD (peripheral artery disease) (HCC)    Seasonal allergies    Wears glasses    Wears partial dentures    upper and lower    Medications:  (Not in a hospital admission)  Scheduled:  Infusions:   Assessment: 59 yom with a history of anxiety, dementia, depression, essential tremor, GERD, HSV, hypercholesterolemia, HTN, PAD ,and hypothyroidism. Patient presenting with weakness/fall. Heparin per pharmacy placed for AF.  Patient not on anticoagulation prior to arrival. Hgb 12.1; plt 184  Goal of Therapy:  Heparin level 0.3-0.7 units/ml Monitor platelets by anticoagulation protocol: Yes   Plan:  No initial bolus  Start heparin infusion at 1400 units/hr Check anti-Xa level in 8 hours and daily while on heparin Continue to monitor H&H and platelets  97, PharmD, BCPS 04/23/2021 8:32 PM ED Clinical Pharmacist -   [phone]

## 2021-04-23 NOTE — ED Notes (Signed)
Patient transported to CT 

## 2021-04-24 ENCOUNTER — Ambulatory Visit (HOSPITAL_BASED_OUTPATIENT_CLINIC_OR_DEPARTMENT_OTHER): Payer: Medicare (Managed Care)

## 2021-04-24 ENCOUNTER — Observation Stay (HOSPITAL_COMMUNITY): Payer: Medicare (Managed Care)

## 2021-04-24 DIAGNOSIS — R296 Repeated falls: Secondary | ICD-10-CM

## 2021-04-24 DIAGNOSIS — Z20822 Contact with and (suspected) exposure to covid-19: Secondary | ICD-10-CM | POA: Diagnosis not present

## 2021-04-24 DIAGNOSIS — R778 Other specified abnormalities of plasma proteins: Secondary | ICD-10-CM | POA: Diagnosis not present

## 2021-04-24 DIAGNOSIS — I214 Non-ST elevation (NSTEMI) myocardial infarction: Secondary | ICD-10-CM | POA: Diagnosis not present

## 2021-04-24 DIAGNOSIS — R0609 Other forms of dyspnea: Secondary | ICD-10-CM

## 2021-04-24 DIAGNOSIS — E039 Hypothyroidism, unspecified: Secondary | ICD-10-CM | POA: Diagnosis not present

## 2021-04-24 DIAGNOSIS — I10 Essential (primary) hypertension: Secondary | ICD-10-CM | POA: Diagnosis not present

## 2021-04-24 LAB — ECHOCARDIOGRAM COMPLETE
AR max vel: 2.91 cm2
AV Area VTI: 2.89 cm2
AV Area mean vel: 2.97 cm2
AV Mean grad: 6 mmHg
AV Peak grad: 12.1 mmHg
Ao pk vel: 1.74 m/s
Area-P 1/2: 2.16 cm2
Height: 67 in
S' Lateral: 3.7 cm
Single Plane A4C EF: 66.9 %
Weight: 3693.15 oz

## 2021-04-24 LAB — LIPID PANEL
Cholesterol: 119 mg/dL (ref 0–200)
HDL: 28 mg/dL — ABNORMAL LOW (ref >40)
LDL Cholesterol: 75 mg/dL (ref 0–99)
Total CHOL/HDL Ratio: 4.3 RATIO
Triglycerides: 79 mg/dL (ref <150)
VLDL: 16 mg/dL (ref 0–40)

## 2021-04-24 LAB — VITAMIN D 25 HYDROXY (VIT D DEFICIENCY, FRACTURES): Vit D, 25-Hydroxy: 31.57 ng/mL (ref 30–100)

## 2021-04-24 LAB — CBC
HCT: 36.3 % — ABNORMAL LOW (ref 39.0–52.0)
Hemoglobin: 11.4 g/dL — ABNORMAL LOW (ref 13.0–17.0)
MCH: 32.5 pg (ref 26.0–34.0)
MCHC: 31.4 g/dL (ref 30.0–36.0)
MCV: 103.4 fL — ABNORMAL HIGH (ref 80.0–100.0)
Platelets: 169 10*3/uL (ref 150–400)
RBC: 3.51 MIL/uL — ABNORMAL LOW (ref 4.22–5.81)
RDW: 13.5 % (ref 11.5–15.5)
WBC: 9.1 10*3/uL (ref 4.0–10.5)
nRBC: 0 % (ref 0.0–0.2)

## 2021-04-24 LAB — BASIC METABOLIC PANEL
Anion gap: 7 (ref 5–15)
BUN: 9 mg/dL (ref 8–23)
CO2: 27 mmol/L (ref 22–32)
Calcium: 8.5 mg/dL — ABNORMAL LOW (ref 8.9–10.3)
Chloride: 104 mmol/L (ref 98–111)
Creatinine, Ser: 1.02 mg/dL (ref 0.61–1.24)
GFR, Estimated: >60 mL/min (ref >60)
Glucose, Bld: 104 mg/dL — ABNORMAL HIGH (ref 70–99)
Potassium: 3.7 mmol/L (ref 3.5–5.1)
Sodium: 138 mmol/L (ref 135–145)

## 2021-04-24 LAB — BRAIN NATRIURETIC PEPTIDE: B Natriuretic Peptide: 40.8 pg/mL (ref 0.0–100.0)

## 2021-04-24 LAB — TSH: TSH: 1.377 u[IU]/mL (ref 0.350–4.500)

## 2021-04-24 LAB — MAGNESIUM: Magnesium: 1.9 mg/dL (ref 1.7–2.4)

## 2021-04-24 LAB — TROPONIN I (HIGH SENSITIVITY)
Troponin I (High Sensitivity): 111 ng/L (ref <18)
Troponin I (High Sensitivity): 90 ng/L — ABNORMAL HIGH (ref <18)

## 2021-04-24 LAB — VITAMIN B12: Vitamin B-12: 240 pg/mL (ref 180–914)

## 2021-04-24 MED ORDER — SIMETHICONE 80 MG PO CHEW
40.0000 mg | CHEWABLE_TABLET | Freq: Once | ORAL | Status: AC
  Administered 2021-04-24: 40 mg via ORAL
  Filled 2021-04-24: qty 1

## 2021-04-24 MED ORDER — LOSARTAN POTASSIUM 50 MG PO TABS: 50.0000 mg | ORAL_TABLET | Freq: Every evening | ORAL | Status: DC

## 2021-04-24 MED ORDER — ASPIRIN EC 81 MG PO TBEC
81.0000 mg | DELAYED_RELEASE_TABLET | Freq: Every day | ORAL | Status: DC
  Administered 2021-04-24: 81 mg via ORAL
  Filled 2021-04-24: qty 1

## 2021-04-24 MED ORDER — SENNOSIDES-DOCUSATE SODIUM 8.6-50 MG PO TABS
1.0000 | ORAL_TABLET | Freq: Every evening | ORAL | Status: DC | PRN
Start: 2021-04-24 — End: 2021-04-24

## 2021-04-24 MED ORDER — LEVOTHYROXINE SODIUM 75 MCG PO TABS
75.0000 ug | ORAL_TABLET | Freq: Every day | ORAL | Status: DC
  Administered 2021-04-24: 75 ug via ORAL
  Filled 2021-04-24: qty 1

## 2021-04-24 MED ORDER — ATORVASTATIN CALCIUM 10 MG PO TABS
20.0000 mg | ORAL_TABLET | Freq: Every day | ORAL | Status: DC
  Administered 2021-04-24: 20 mg via ORAL
  Filled 2021-04-24: qty 2

## 2021-04-24 MED ORDER — HEPARIN SODIUM (PORCINE) 5000 UNIT/ML IJ SOLN: 5000.0000 [IU] | Freq: Three times a day (TID) | INTRAMUSCULAR | Status: DC

## 2021-04-24 MED ORDER — ACETAMINOPHEN 650 MG RE SUPP: 650.0000 mg | Freq: Four times a day (QID) | RECTAL | Status: DC | PRN

## 2021-04-24 MED ORDER — ACETAMINOPHEN 325 MG PO TABS: 650.0000 mg | ORAL_TABLET | Freq: Four times a day (QID) | ORAL | Status: DC | PRN

## 2021-04-24 MED ORDER — PANTOPRAZOLE SODIUM 40 MG PO TBEC
40.0000 mg | DELAYED_RELEASE_TABLET | Freq: Every day | ORAL | Status: DC
  Administered 2021-04-24: 40 mg via ORAL
  Filled 2021-04-24: qty 1

## 2021-04-24 NOTE — H&P (Addendum)
Date: 04/24/2021               Patient Name:  Kenneth Brandt MRN: [id number]  DOB: [birth year] Age / Sex: 86 y.o., male   PCP: Pcp, No         Medical Service: Internal Medicine Teaching Service         Attending Physician: Dr. Inez Catalina, MD    First Contact: Dr. Ned Card Pager: [phone]  Second Contact: Dr. Austin Miles Pager: [phone]       After Hours (After 5p/  First Contact Pager: [phone]  weekends / holidays): Second Contact Pager: [phone]   Chief Complaint: unwitnessed fall  History of Present Illness: Kenneth Brandt is a 86 y.o. male with a history of dementia, anxiety, HTN, dyslipidemia, PAD, and hypothyroidism who presents to the ED today after sustaining an unwitnessed fall earlier this morning.   History obtained from patient's daughter over the phone:  Per daughter, patient's wife found him on the floor beside the bed around 2 PM, and she was concerned because the patient was not speaking to her at first.  His wife then moved him into the kitchen, and when he was sitting down she noticed that he was leaning over the table and was speaking incoherently.  Wife called EMS because they were able to move him or get him to walk on his own. Called EMS because family was unable to move him or get him to walk on his own. When EMS picked him up seem he was having trouble getting a good breath but he was not in any pain.  Daughter states that he seemed weaker and more confused than usual; he mentioned to her that he did not remember falling out of his bed and onto the floor. He did appear more lucid this evening and seems to be close to or at his baseline now.  His daughter states that they recently traveled to [place] for about 3-4 weeks. He reported feeling a little "woozy" for the first few days that they were in [place], however this resolved and he has not felt this way since. She states that they were pretty exhausted from the trip but otherwise did not notice anything out of  the ordinary until this morning. Patient and his wife flew both ways and drove to multiple cities across [place] with other family members.  Additional history obtained from the patient at bedside:  Patient does not currently have any complaints. He confirmed his daughter's story as above but also adds that he got up around 10 AM with his wife to eat breakfast and felt like his usual self then before going back to sleep.    He said that he has felt woozy once before the episode he had in [place], stating that he went to urgent care and was told that he had a bowel infection.  Denies recent sick contacts.  Has some reflux, nausea, and constipation however these are chronic issues for him.  Endorses difficulty swallowing some solids, with the need to "push food toward the side to get it to go down."  He is able to drink liquids okay.  Unsure if he has a fever but denies chills, vomiting, vision changes, chest pain, shortness of breath, worsening BLE swelling, worsening abdominal pain  Of note, patient reports being on Lasix regimen at one point, but he states that it "did not help."  Meds:  Current Meds  Medication Sig   aspirin EC 81  MG tablet Take 81 mg by mouth daily. Swallow whole.   atorvastatin (LIPITOR) 20 MG tablet Take 20 mg by mouth daily.   calcium-vitamin D (OSCAL WITH D) 500-200 MG-UNIT TABS tablet Take 1 tablet by mouth in the morning and at bedtime.   DULoxetine (CYMBALTA) 60 MG capsule Take 60 mg by mouth daily.    fexofenadine (ALLEGRA) 180 MG tablet Take 180 mg by mouth daily.   guaiFENesin-dextromethorphan (ROBITUSSIN DM) 100-10 MG/5ML syrup Take 5 mLs by mouth every 4 (four) hours as needed for cough.   ibandronate (BONIVA) 150 MG tablet Take 150 mg by mouth every 30 (thirty) days. Take in the morning with a full glass of water, on an empty stomach, and do not take anything else by mouth or lie down for the next 30 min.   levothyroxine (SYNTHROID, LEVOTHROID) 75 MCG tablet Take 75  mcg by mouth daily before breakfast.   LORazepam (ATIVAN) 0.5 MG tablet Take 0.5 mg by mouth at bedtime. sleep   losartan (COZAAR) 50 MG tablet Take 1 tablet (50 mg total) by mouth every evening.   morphine (MS CONTIN) 30 MG 12 hr tablet Take 30 mg by mouth 2 (two) times daily as needed for pain.   morphine (MSIR) 15 MG tablet Take 15 mg by mouth every 4 (four) hours as needed for severe pain.   pantoprazole (PROTONIX) 40 MG tablet Take 40 mg by mouth daily.   polyethylene glycol (MIRALAX / GLYCOLAX) 17 g packet Take 17 g by mouth daily as needed for mild constipation.   pregabalin (LYRICA) 75 MG capsule Take 75 mg by mouth 2 (two) times daily.    torsemide (DEMADEX) 20 MG tablet Take 2 tablets (40 mg total) by mouth daily.   traZODone (DESYREL) 100 MG tablet Take 100 mg by mouth at bedtime.     Allergies: Allergies as of 04/23/2021 - Review Complete 04/23/2021  Allergen Reaction Noted   Lactose intolerance (gi) Other (See Comments) 03/14/2018   Past Medical History:  Diagnosis Date   Anxiety    Arthritis    Chronic back pain    Dementia (HCC)    Depression    Dyspnea    occasionally   Essential tremor    GERD (gastroesophageal reflux disease)    Hepatitis    A -treated   HSV infection    Hypercholesteremia    Hypertension    Hypothyroid    Neuromuscular disorder (HCC)    PAD (peripheral artery disease) (HCC)    Seasonal allergies    Wears glasses    Wears partial dentures    upper and lower   Family History: His father died of heart disease, his sister has had several heart attacks.  Mom with a history of uterine cancer   Social History: Lives at home with wife. Denies any tobacco, alcohol and illicit drug use. Walks unassisted normally.  Able to complete ADLs without issue.  Review of Systems: A complete ROS was negative except as per HPI.   Physical Exam: Blood pressure 113/63, pulse 79, temperature 98.9 F (37.2 C), temperature source Oral, resp. rate 17, height  5\' 7"  (1.702 m), weight 104.7 kg, SpO2 93 %. Physical Exam Constitutional:      General: He is not in acute distress.    Appearance: Normal appearance.  HENT:     Head: Normocephalic and atraumatic.     Mouth/Throat:     Mouth: Mucous membranes are moist.  Eyes:     Extraocular Movements: Extraocular  movements intact.     Conjunctiva/sclera: Conjunctivae normal.     Pupils: Pupils are equal, round, and reactive to light.  Cardiovascular:     Rate and Rhythm: Normal rate and regular rhythm.     Pulses: Normal pulses.     Heart sounds: No murmur heard.   No friction rub. No gallop.  Pulmonary:     Effort: Pulmonary effort is normal.     Comments: Bilateral bibasilar crackles L>R Abdominal:     General: There is no distension.     Palpations: Abdomen is soft.     Comments: Tenderness to deep palpation of the suprapubic area.  No rebound or guarding  Musculoskeletal:        General: Normal range of motion.     Comments: 1+ bilateral pitting edema.  Skin:    General: Skin is warm and dry.     Comments: LLE: Small eczematous rash over the left ankle  Neurological:     General: No focal deficit present.     Mental Status: He is alert and oriented to person, place, and time. Mental status is at baseline.     Cranial Nerves: No cranial nerve deficit.     Motor: No weakness.  Psychiatric:        Mood and Affect: Mood normal.        Behavior: Behavior normal.   EKG: personally reviewed my interpretation is sinus rhythm with discernible P waves before QRS.  CXR: personally reviewed my interpretation is no acute findings  CTA, 08/26: No evidence of significant pulmonary embolus.  Assessment & Plan by Problem: Active Problems:   Elevated troponin   #Fall, unwitnessed Pt has mild dementia and a history of multiple falls in the past. He sustained an unwitnessed fall on 08/26 that he does not remember and was on the bedroom floor for a few hours before EMS was called. Initial O2  sat of 90%, patient placed on 4 L of oxygen by EMS, but he has not required oxygen since. Has been afebrile and has not tachypneic or tachycardic since arrival.  He was hypotensive at 96/56, blood pressure improved to 110s/60s after given half liter fluid bolus. On exam today, the patient had 1+ bilateral pitting edema and distant bibasilar crackles were noted. Hgb 12.1, WBC 8.5, D-dimer 2.27, electrolytes WNL. Troponins 226 -> 198.  EKG was unremarkable with the exception of prolonged QT. Last echo in 2019 shows EF 60-65% and G1DD. Chest x-ray today is unremarkable for any acute cardiopulmonary processes. CTA did not show evidence of pulmonary embolus.   It is unclear how the patient fell or whether he had a true syncopal event. Could be orthostatic syncope, given that patient was mildly hypotensive on arrival with improvement of blood pressure to normal range after fluid bolus.  Could also be due to cardiogenic syncope, which we are working up with the cardiology team. Could also be due to a seizure (he was not talking and was confused for some time after his fall).  -Cardiology on board, greatly appreciate their recs -Encouraged PO intake -Vitamin D and B12 levels pending   #Elevated troponins #HFpEF Pt did not have CP but did have a new oxygen requirement on arrival. EKG did not show any ischemic changes but did show prolonged QT. Most likely causes of his elevated troponins are an arrhythmia or heart failure exacerbation, which we are currently working up with the assistance of the cardiology team. Per cards recommendations, if his troponins remain flat,  could consider noninvasive ischemia eval; however, if his troponins rise significantly, would consider LHC. Nevertheless, we will obtain an updated echocardiogram. he does not have any focal neurologic deficits on exam today, however, given that the fall was unwitnessed, we will obtain a noncontrast CT to rule out stroke as a potential cause.  Infection is less likely given that he is afebrile, normal WBC, unremarkable chest x-ray.  PE unlikely given negative CTA.   -Cardiology on board, greatly appreciate their recs -BNP pending -Troponins q2h -Repeat echo pending -CT without contrast pending  #Prolonged QT QTc of 501 ms on EKG today.  Patient is on Cymbalta, trazodone, and Lyrica, which can prolong QT.  We will need to consider adjusting or changing these medications moving forward. -Telemetry -Avoid QT prolonging medications -Holding cymbalta, trazodone, and lyrica  #HTN Pt briefly hypotensive on arrival to 96/56, BP now ranging 100s to 110s systolic status post half liter fluid bolus. -Continue losartan 50 mg p.o. daily  #HLD -Continue atorvastatin 20 mg p.o. daily -Lipid panel pending  #GERD -Continue Protonix 40 mg p.o. daily  #Hypothyroidism On levothyroxine at home. Last TSH in December 2021 was WNL.  -Continue Synthroid 75 mcg p.o. daily -Repeat TSH  Dispo: Admit patient to Observation with expected length of stay less than 2 midnights.  Signed: Andrey Campanile, MD 04/24/2021, 2:37 AM  Pager: [phone]  After 5pm on weekdays and 1pm on weekends: On Call pager: [phone]

## 2021-04-24 NOTE — Progress Notes (Addendum)
Patient ambulated in hall with walker. Slow steady gait. Denies any shortness of breath or pain with ambulation. O2 saturation 94-97% on room air with ambulation. Blood pressure 1245 126/69 with HR 74 before ambulation and 1255 blood pressure 155/75 with HR 85 after ambulation. Notified provider and CM.

## 2021-04-24 NOTE — ED Notes (Signed)
Pt back from CT

## 2021-04-24 NOTE — Hospital Course (Addendum)
Kenneth Brandt is a 86 y.o. male with a history of dementia, anxiety, HTN, dyslipidemia, PAD, and hypothyroidism who presents to the ED today after sustaining an unwitnessed fall earlier this morning and admitted for elevated troponins.  #Fall, unwitnessed Pt has mild dementia and a history of multiple falls in the past. He sustained an unwitnessed fall on 08/26 that he does not remember and was on the bedroom floor for a few hours before EMS was called. Initial O2 sat of 90%, patient placed on 4 L of oxygen by EMS, but he has not required oxygen since. Has been afebrile and has not tachypneic or tachycardic since arrival.  He was hypotensive at 96/56, blood pressure improved to 110s/60s after given half liter fluid bolus. On exam today, the patient had 1+ bilateral pitting edema and distant bibasilar crackles were noted. Hgb 12.1, WBC 8.5, D-dimer 2.27, electrolytes WNL. Troponins 226 -> 198 -->111.  EKG was unremarkable with the exception of prolonged QT. Last echo in 2019 shows EF 60-65% and G1DD. Chest x-ray today is unremarkable for any acute cardiopulmonary processes. CTA did not show evidence of pulmonary embolus. It is unclear how the patient fell or whether he had a true syncopal event. Could be orthostatic syncope, given that patient was mildly hypotensive on arrival with improvement of blood pressure to normal range after fluid bolus.  Unsure if this was true syncope, but patient has returned to his baseline and endorses no further lightheadedness. Recommended for home health PT. Patient did not require any supplemental O2 on discharge and his ambulatory pulse ox was within normal limits.   #Elevated troponins #HFpEF Pt did not have CP but did have a new oxygen requirement on arrival. EKG did not show any ischemic changes but did show prolonged QT likely secondary to psych medications. Per cardiology, elevated troponins are of unknown significance and they did continue to downtrend during  course of hospital stay. TTE with hyperdynamic LVEF and no regional wall motion abnormalities noted by cardiologist. PE unlikely given negative CTA. No further cardiac workup recommended at this time, but patient is advised to follow up with cardiology/pcp on discharge.     #Prolonged QT QTc of 501 ms on EKG, improved to 453 on morning of discharge. Patient is on Cymbalta, trazodone, and Lyrica, which can prolong QT. Psych recommended discontinuing trazodone for this patient and recommended to keep Cymbalta and lyrica. Patient also advised to discontinue phenergan, as this can prolong QT interval.   #HTN Pt briefly hypotensive on arrival to 96/56, BP now ranging 100s to 110s systolic status post half liter fluid bolus. Continued home losartan 50 mg p.o. daily   #HLD Continued atorvastatin 20 mg p.o. daily    #GERD Continued Protonix 40 mg p.o. daily   #Hypothyroidism On levothyroxine at home. Last TSH in December 2021 was WNL and TSH today was also within normal limits.

## 2021-04-24 NOTE — Evaluation (Signed)
Physical Therapy Evaluation Patient Details Name: Kenneth Brandt MRN: [id number] DOB: [birth year] Today's Date: 04/24/2021   History of Present Illness  Pt is an 86 y/o male admitted 8/25 after being found on the floor. Found to have elevated troponins and further workup pending. PMH includes dementia, anxiety, HTN, and PAD.  Clinical Impression  Pt admitted secondary to problem above with deficits below. Pt requiring min A for bed mobility and min guard A to stand and transfer to chair. Mild shakiness noted. Educated about using RW at home to increase safety. Recommending HHPT at d/c to address current deficits. Will continue to follow acutely.     Follow Up Recommendations Home health PT;Supervision/Assistance - 24 hour    Equipment Recommendations  None recommended by PT    Recommendations for Other Services OT consult     Precautions / Restrictions Precautions Precautions: Fall Restrictions Weight Bearing Restrictions: No      Mobility  Bed Mobility Overal bed mobility: Needs Assistance Bed Mobility: Supine to Sit     Supine to sit: Min assist     General bed mobility comments: Min A for trunk assist to come to sitting    Transfers Overall transfer level: Needs assistance Equipment used: None Transfers: Sit to/from [employer] Sit to Stand: Min guard Stand pivot transfers: Min guard       General transfer comment: Min guard for safety to stand and transfer to chair. Mild shakiness noted. Mobility limited to chair as pt wanting to eat breakfast. Educated about using RW at home to increase safety  Ambulation/Gait                Stairs            Wheelchair Mobility    Modified Rankin (Stroke Patients Only)       Balance Overall balance assessment: Needs assistance Sitting-balance support: No upper extremity supported;Feet supported Sitting balance-Leahy Scale: Good     Standing balance support: No upper extremity  supported;During functional activity Standing balance-Leahy Scale: Fair                               Pertinent Vitals/Pain Pain Assessment: No/denies pain    Home Living Family/patient expects to be discharged to:: Private residence Living Arrangements: Spouse/significant other Available Help at Discharge: Family;Available 24 hours/day Type of Home: Apartment Home Access: Level entry     Home Layout: One level Home Equipment: Walker - 4 wheels;Cane - single point;Shower seat;Grab bars - tub/shower;Bedside commode;Walker - 2 wheels      Prior Function Level of Independence: Needs assistance   Gait / Transfers Assistance Needed: Pt reports he does not use AD.  ADL's / Homemaking Assistance Needed: REports he has assist for housekeeping. Reports independence with ADLs        Hand Dominance        Extremity/Trunk Assessment   Upper Extremity Assessment Upper Extremity Assessment: Defer to OT evaluation    Lower Extremity Assessment Lower Extremity Assessment: Generalized weakness    Cervical / Trunk Assessment Cervical / Trunk Assessment: Kyphotic  Communication   Communication: No difficulties  Cognition Arousal/Alertness: Awake/alert Behavior During Therapy: WFL for tasks assessed/performed Overall Cognitive Status: History of cognitive impairments - at baseline                                 General Comments: per  chart, hx of dementia. Appropriate throughout session      General Comments      Exercises     Assessment/Plan    PT Assessment Patient needs continued PT services  PT Problem List Decreased strength;Decreased balance;Decreased mobility;Decreased knowledge of use of DME;Decreased knowledge of precautions       PT Treatment Interventions DME instruction;Gait training;Functional mobility training;Therapeutic activities;Balance training;Therapeutic exercise;Patient/family education    PT Goals (Current goals can be  found in the Care Plan section)  Acute Rehab PT Goals Patient Stated Goal: to go home PT Goal Formulation: With patient Time For Goal Achievement: 05/08/21 Potential to Achieve Goals: Good    Frequency Min 3X/week   Barriers to discharge        Co-evaluation               AM-PAC PT "6 Clicks" Mobility  Outcome Measure Help needed turning from your back to your side while in a flat bed without using bedrails?: A Little Help needed moving from lying on your back to sitting on the side of a flat bed without using bedrails?: A Little Help needed moving to and from a bed to a chair (including a wheelchair)?: A Little Help needed standing up from a chair using your arms (e.g., wheelchair or bedside chair)?: A Little Help needed to walk in hospital room?: A Little Help needed climbing 3-5 steps with a railing? : A Lot 6 Click Score: 17    End of Session Equipment Utilized During Treatment: Gait belt Activity Tolerance: Patient tolerated treatment well Patient left: in chair;with call Kenneth/phone within reach;with chair alarm set Nurse Communication: Mobility status PT Visit Diagnosis: Unsteadiness on feet (R26.81);Muscle weakness (generalized) (M62.81)    Time: 5366-4403 PT Time Calculation (min) (ACUTE ONLY): 16 min   Charges:   PT Evaluation $PT Eval Low Complexity: 1 Low          Cindee Salt, DPT  Acute Rehabilitation Services  Pager: [phone] Office: [phone]   Le[facility] Prom 04/24/2021, 11:59 AM

## 2021-04-24 NOTE — Discharge Summary (Signed)
Name: Kenneth Brandt MRN: [id number] DOB: 1935-08-12 86 y.o. PCP: Pcp, No  Date of Admission: 04/23/2021  5:09 PM Date of Discharge:  04/24/2021 Attending Physician: Dr.  Ninetta LightsHatcher  DISCHARGE DIAGNOSIS:  Primary Problem: Unwitnessed fall   Hospital Problems: Principal Problem:   Unwitnessed fall Active Problems:   Hypothyroidism   Elevated troponin    DISCHARGE MEDICATIONS:   Allergies as of 04/24/2021       Reactions   Lactose Intolerance (gi) Other (See Comments)   Gi upset        Medication List     STOP taking these medications    promethazine 25 MG suppository Commonly known as: Phenergan   traZODone 100 MG tablet Commonly known as: DESYREL       TAKE these medications    aspirin EC 81 MG tablet Take 81 mg by mouth daily. Swallow whole.   atorvastatin 20 MG tablet Commonly known as: LIPITOR Take 20 mg by mouth daily.   calcium-vitamin D 500-200 MG-UNIT Tabs tablet Commonly known as: OSCAL WITH D Take 1 tablet by mouth in the morning and at bedtime.   DULoxetine 60 MG capsule Commonly known as: CYMBALTA Take 60 mg by mouth daily.   fexofenadine 180 MG tablet Commonly known as: ALLEGRA Take 180 mg by mouth daily.   guaiFENesin-dextromethorphan 100-10 MG/5ML syrup Commonly known as: ROBITUSSIN DM Take 5 mLs by mouth every 4 (four) hours as needed for cough.   ibandronate 150 MG tablet Commonly known as: BONIVA Take 150 mg by mouth every 30 (thirty) days. Take in the morning with a full glass of water, on an empty stomach, and do not take anything else by mouth or lie down for the next 30 min.   levothyroxine 75 MCG tablet Commonly known as: SYNTHROID Take 75 mcg by mouth daily before breakfast.   LORazepam 0.5 MG tablet Commonly known as: ATIVAN Take 0.5 mg by mouth at bedtime. sleep   losartan 50 MG tablet Commonly known as: COZAAR Take 1 tablet (50 mg total) by mouth every evening.   morphine 30 MG 12 hr tablet Commonly known  as: MS CONTIN Take 30 mg by mouth 2 (two) times daily as needed for pain.   morphine 15 MG tablet Commonly known as: MSIR Take 15 mg by mouth every 4 (four) hours as needed for severe pain.   pantoprazole 40 MG tablet Commonly known as: PROTONIX Take 40 mg by mouth daily.   polyethylene glycol 17 g packet Commonly known as: MIRALAX / GLYCOLAX Take 17 g by mouth daily as needed for mild constipation.   pregabalin 75 MG capsule Commonly known as: LYRICA Take 75 mg by mouth 2 (two) times daily.   torsemide 20 MG tablet Commonly known as: DEMADEX Take 2 tablets (40 mg total) by mouth daily.        DISPOSITION AND FOLLOW-UP:  Mr.Kenneth Brandt [facility] in Stable condition. At the hospital follow up visit please address:  Follow-up Recommendations: Consults: Cardiology/PCP Studies: ekg (qtc interval follow up) Medications: STOP Trazodone, zofran and phenergan given prolonged Qtc interval on ekg  Follow-up Appointments:  Follow-up Information     Hilty, Lisette AbuKenneth C, MD Follow up in 1 week(s).   Specialty: Cardiology Why: medication management and cardio follow up Contact information: [address] [place] [address] [phone]                 HOSPITAL COURSE:  Patient Summary:  Kenneth Brandt is a 86 y.o. male with a history of dementia, anxiety, HTN, dyslipidemia, PAD, and hypothyroidism who presents to the ED today after sustaining an unwitnessed fall earlier this morning and admitted for elevated troponins.  #Fall, unwitnessed Pt has mild dementia and a history of multiple falls in the past. He sustained an unwitnessed fall on 08/26 that he does not remember and was on the bedroom floor for a few hours before EMS was called. Initial O2 sat of 90%, patient placed on 4 L of oxygen by EMS, but he has not required oxygen since. Has been afebrile and has not tachypneic or tachycardic since arrival.  He  was hypotensive at 96/56, blood pressure improved to 110s/60s after given half liter fluid bolus. On exam today, the patient had 1+ bilateral pitting edema and distant bibasilar crackles were noted. Hgb 12.1, WBC 8.5, D-dimer 2.27, electrolytes WNL. Troponins 226 -> 198 -->111.  EKG was unremarkable with the exception of prolonged QT. Last echo in 2019 shows EF 60-65% and G1DD. Chest x-ray today is unremarkable for any acute cardiopulmonary processes. CTA did not show evidence of pulmonary embolus. It is unclear how the patient fell or whether he had a true syncopal event. Could be orthostatic syncope, given that patient was mildly hypotensive on arrival with improvement of blood pressure to normal range after fluid bolus.  Unsure if this was true syncope, but patient has returned to his baseline and endorses no further lightheadedness. Recommended for home health PT. Patient did not require any supplemental O2 on discharge and his ambulatory pulse ox was within normal limits.   #Elevated troponins #HFpEF Pt did not have CP but did have a new oxygen requirement on arrival. EKG did not show any ischemic changes but did show prolonged QT likely secondary to psych medications. Per cardiology, elevated troponins are of unknown significance and they did continue to downtrend during course of hospital stay. TTE with hyperdynamic LVEF and no regional wall motion abnormalities noted by cardiologist. PE unlikely given negative CTA. No further cardiac workup recommended at this time, but patient is advised to follow up with cardiology/pcp on discharge.     #Prolonged QT QTc of 501 ms on EKG, improved to 453 on morning of discharge. Patient is on Cymbalta, trazodone, and Lyrica, which can prolong QT. Psych recommended discontinuing trazodone for this patient and recommended to keep Cymbalta and lyrica. Patient also advised to discontinue phenergan, as this can prolong QT interval.   #HTN Pt briefly hypotensive on  arrival to 96/56, BP now ranging 100s to 110s systolic status post half liter fluid bolus. Continued home losartan 50 mg p.o. daily   #HLD Continued atorvastatin 20 mg p.o. daily    #GERD Continued Protonix 40 mg p.o. daily   #Hypothyroidism On levothyroxine at home. Last TSH in December 2021 was WNL and TSH today was also within normal limits.    DISCHARGE INSTRUCTIONS:   Discharge Instructions     (HEART FAILURE PATIENTS) Call MD:  Anytime you have any of the following symptoms: 1) 3 pound weight gain in 24 hours or 5 pounds in 1 week 2) shortness of breath, with or without a dry hacking cough 3) swelling in the hands, feet or stomach 4) if you have to sleep on extra pillows at night in order to breathe.   Complete by: As directed    Call MD for:  difficulty breathing, headache or visual disturbances   Complete by: As directed    Call  MD for:  persistant dizziness or light-headedness   Complete by: As directed    Call MD for:  persistant dizziness or light-headedness   Complete by: As directed    Call MD for:  persistant nausea and vomiting   Complete by: As directed    Call MD for:  severe uncontrolled pain   Complete by: As directed    Call MD for:  temperature >100.4   Complete by: As directed    Diet - low sodium heart healthy   Complete by: As directed    Increase activity slowly   Complete by: As directed    Increase activity slowly   Complete by: As directed        SUBJECTIVE:  Mr. Hallenbeck was seen and examined on the day of discharge. He reports that he is feeling better and continues to deny any chest pain or shortness of breath. Worked with PT this morning and is recommended to have home health PT on discharge. Patient also is no longer requiring supplemental O2 and ambulatory pulse ox was stable.   Discharge Vitals:   BP (!) 155/75   Pulse 74   Temp 98 F (36.7 Brandt) (Oral)   Resp (!) 28   Ht 5\' 7"  (1.702 m)   Wt 104.7 kg   SpO2 95%   BMI 36.15 kg/m    OBJECTIVE:  General: Pleasant, well-appearing elderly male laying in bed. No acute distress. CV: RRR. No murmurs, rubs, or gallops. No LE edema Pulmonary: Lungs CTAB. Normal effort. No wheezing or rales. Abdominal: Soft, nontender, nondistended. Normal bowel sounds. Extremities: Palpable radial and DP pulses. Normal ROM. Skin: Warm and dry. No obvious rash or lesions. Neuro: A&Ox3. Moves all extremities. Normal sensation. No focal deficit. Psych: Normal mood and affect   Pertinent Labs, Studies, and Procedures:  CBC Latest Ref Rng & Units 04/24/2021 04/23/2021 08/03/2020  WBC 4.0 - 10.5 K/uL 9.1 8.5 7.8  Hemoglobin 13.0 - 17.0 g/dL 11.4(L) 12.1(L) 11.3(L)  Hematocrit 39.0 - 52.0 % 36.3(L) 37.1(L) 34.5(L)  Platelets 150 - 400 K/uL 169 184 146(L)    CMP Latest Ref Rng & Units 04/24/2021 04/23/2021 08/03/2020  Glucose 70 - 99 mg/dL 14/12/2019) 938(B) 017(P)  BUN 8 - 23 mg/dL 9 12 13   Creatinine 0.61 - 1.24 mg/dL 102(H 8.52  Sodium 135 - 145 mmol/L 138 137 138  Potassium 3.5 - 5.1 mmol/L 3.7 4.7 3.6  Chloride 98 - 111 mmol/L 104 102 106  CO2 22 - 32 mmol/L 27 29 24   Calcium 8.9 - 10.3 mg/dL 7.78) 2.42) 8.1(L)  Total Protein 6.5 - 8.1 g/dL - 5.5(L) -  Total Bilirubin 0.3 - 1.2 mg/dL - 1.0 -  Alkaline Phos 38 - 126 U/L - 75 -  AST 15 - 41 U/L - 33 -  ALT 0 - 44 U/L - 12 -    FOLLOW-UP INSTRUCTIONS:  Thank you for allowing to be part of your care. You were hospitalized for Unwitnessed fall.  Please follow up with the following providers: A. PCP, Dr. 3.5(T in 1 week for hospital follow up   Please note these changes made to your medications:   A. Medications to continue: Current Meds  Medication Sig   aspirin EC 81 MG tablet Take 81 mg by mouth daily. Swallow whole.   atorvastatin (LIPITOR) 20 MG tablet Take 20 mg by mouth daily.   calcium-vitamin D (OSCAL WITH D) 500-200 MG-UNIT TABS tablet Take 1 tablet by mouth in the morning  and at bedtime.   DULoxetine (CYMBALTA) 60 MG  capsule Take 60 mg by mouth daily.    fexofenadine (ALLEGRA) 180 MG tablet Take 180 mg by mouth daily.   guaiFENesin-dextromethorphan (ROBITUSSIN DM) 100-10 MG/5ML syrup Take 5 mLs by mouth every 4 (four) hours as needed for cough.   ibandronate (BONIVA) 150 MG tablet Take 150 mg by mouth every 30 (thirty) days. Take in the morning with a full glass of water, on an empty stomach, and do not take anything else by mouth or lie down for the next 30 min.   levothyroxine (SYNTHROID, LEVOTHROID) 75 MCG tablet Take 75 mcg by mouth daily before breakfast.   LORazepam (ATIVAN) 0.5 MG tablet Take 0.5 mg by mouth at bedtime. sleep   losartan (COZAAR) 50 MG tablet Take 1 tablet (50 mg total) by mouth every evening.   morphine (MS CONTIN) 30 MG 12 hr tablet Take 30 mg by mouth 2 (two) times daily as needed for pain.   morphine (MSIR) 15 MG tablet Take 15 mg by mouth every 4 (four) hours as needed for severe pain.   pantoprazole (PROTONIX) 40 MG tablet Take 40 mg by mouth daily.   polyethylene glycol (MIRALAX / GLYCOLAX) 17 g packet Take 17 g by mouth daily as needed for mild constipation.   pregabalin (LYRICA) 75 MG capsule Take 75 mg by mouth 2 (two) times daily.    torsemide (DEMADEX) 20 MG tablet Take 2 tablets (40 mg total) by mouth daily.          B. Medications to discontinue:  Phenergan Zofran Trazodone   Please make sure to follow up with your primary care physician/cardiologist, Dr. Loleta Chance in 1 week for hospital follow up.  Please call our clinic if you have any questions or concerns, we may be able to help and keep you from a long and expensive emergency room wait. Our clinic and after hours phone number is [phone], the best time to call is Monday through Friday 9 am to 4 pm but there is always someone available 24/7 if you have an emergency. If you need medication refills please notify your pharmacy one week in advance and they will send [place] a request.       Signed: Elza Rafter, D.O.   Internal Medicine Resident, PGY-1 [facility]  Pager: [phone] 1:28 PM, 04/24/2021

## 2021-04-24 NOTE — ED Notes (Signed)
Patient transported to CT 

## 2021-04-24 NOTE — Care Management (Signed)
1319 04-24-21 Case Manager was able to speak with wife and daughter regarding the patient returning home. Family is agreeable to the patient returning home. Case Manager contacted Marylu Lund CSW with [facility] and she is aware that the patient will need HH PT Services. Per Marylu Lund, the patient will be asked to do PT at the [facility]. Daughter is agreeable to this plan of care. Family to discuss with [facility] regarding additional aid assistance in the home for ADL's. Staff RN to call daughter when the patient is ready for discharge. No further needs from Case Manager at this time.

## 2021-04-24 NOTE — Progress Notes (Addendum)
Progress Note  Patient Name: Kenneth Brandt Date of Encounter: 04/24/2021  [facility] Cardiologist: None new Dr. Rennis Golden  Subjective   Has some mild chest pain to rt of sternum, no SOB-just completed Echo  Inpatient Medications    Scheduled Meds:  aspirin EC  81 mg Oral Daily   atorvastatin  20 mg Oral Daily   heparin  5,000 Units Subcutaneous Q8H   levothyroxine  75 mcg Oral QAC breakfast   losartan  50 mg Oral QPM   pantoprazole  40 mg Oral Daily   Continuous Infusions:  PRN Meds: acetaminophen **OR** acetaminophen, senna-docusate   Vital Signs    Vitals:   04/24/21 0330 04/24/21 0400 04/24/21 0502 04/24/21 0731  BP: (!) 109/47 (!) 99/49 119/62 (!) 104/59  Pulse: 61 61 67 62  Resp:  15 20 14   Temp:  98.2 F (36.8 C) 98.5 F (36.9 C) 98.3 F (36.8 C)  TempSrc:  Oral Oral Oral  SpO2: 98% 98% 98% 97%  Weight:      Height:       No intake or output data in the 24 hours ending 04/24/21 0749 Last 3 Weights 04/23/2021 09/11/2020 08/03/2020  Weight (lbs) 230 lb 13.2 oz 230 lb 12.8 oz 236 lb 6.4 oz  Weight (kg) 104.7 kg 104.69 kg 107.23 kg      Telemetry    SR   - Personally Reviewed  ECG    Repeat EKG ordered see in A/P- Personally Reviewed  Physical Exam   GEN: No acute distress.   Neck: No JVD Cardiac: RRR, no murmurs, rubs, or gallops. No chest tenderness with palpation Respiratory: Clear to auscultation bilaterally. GI: Soft, nontender, non-distended  MS: No edema; No deformity. Neuro:  Nonfocal  Psych: Normal affect   Labs    High Sensitivity Troponin:   Recent Labs  Lab 04/23/21 1727 04/23/21 1928 04/24/21 0616  TROPONINIHS 226* 198* 111*      Chemistry Recent Labs  Lab 04/23/21 1727 04/24/21 0247  NA 137 138  K 4.7 3.7  CL 102 104  CO2 29 27  GLUCOSE 109* 104*  BUN 12 9  CREATININE 1.16 1.02  CALCIUM 8.4* 8.5*  PROT 5.5*  --   ALBUMIN 3.0*  --   AST 33  --   ALT 12  --   ALKPHOS 75  --   BILITOT 1.0  --   GFRNONAA  >60 >60  ANIONGAP 6 7     Hematology Recent Labs  Lab 04/23/21 1727 04/24/21 0247  WBC 8.5 9.1  RBC 3.67* 3.51*  HGB 12.1* 11.4*  HCT 37.1* 36.3*  MCV 101.1* 103.4*  MCH 33.0 32.5  MCHC 32.6 31.4  RDW 13.3 13.5  PLT 184 169    BNP Recent Labs  Lab 04/23/21 1727  BNP 40.8     DDimer  Recent Labs  Lab 04/23/21 1727  DDIMER 2.27*     Radiology    CT HEAD WO CONTRAST (04/25/21)  Result Date: 04/24/2021 CLINICAL DATA:  Syncope EXAM: CT HEAD WITHOUT CONTRAST TECHNIQUE: Contiguous axial images were obtained from the base of the skull through the vertex without intravenous contrast. COMPARISON:  09/15/2017 FINDINGS: Brain: Normal anatomic configuration. Parenchymal volume loss is commensurate with the patient's age. Moderate periventricular white matter changes are present, slightly progressive since prior examination, likely reflecting the sequela of small vessel ischemia. Remote lacunar infarct noted within the a insular cortices bilaterally, new since prior examination no abnormal intra or extra-axial mass lesion  or fluid collection. No abnormal mass effect or midline shift. No evidence of acute intracranial hemorrhage or infarct. Ventricular size is normal. Cerebellum unremarkable. Vascular: No asymmetric hyperdense vasculature at the skull base. Skull: Intact Sinuses/Orbits: Paranasal sinuses are clear. Right scleral buckle noted. Orbits are otherwise unremarkable. Other: Mastoid air cells and middle ear cavities are clear. IMPRESSION: No acute intracranial abnormality.  No calvarial fracture. Moderate senescent change, progressive since prior examination, likely the sequela of small vessel ischemia. Bilateral insular cortex lacunar infarcts, new since prior examination, but remote in nature. Electronically Signed   By: Helyn Numbers M.D.   On: 04/24/2021 04:02   CT Angio Chest PE W/Cm &/Or Wo Cm  Result Date: 04/23/2021 CLINICAL DATA:  Pulmonary embolus suspected with low to  intermediate probability. Positive D-dimer. Recent travel from [place]. EXAM: CT ANGIOGRAPHY CHEST WITH CONTRAST TECHNIQUE: Multidetector CT imaging of the chest was performed using the standard protocol during bolus administration of intravenous contrast. Multiplanar CT image reconstructions and MIPs were obtained to evaluate the vascular anatomy. CONTRAST:  64mL OMNIPAQUE IOHEXOL 350 MG/ML SOLN COMPARISON:  08/02/2020 FINDINGS: Cardiovascular: Good opacification of the central and segmental pulmonary arteries. No focal filling defects. No evidence of significant pulmonary embolus. Normal heart size. No pericardial effusions. Coronary artery and aortic calcifications. No aortic aneurysm. Focal prominence of the inside the aortic arch probably representing residual infundibulum. No change. Mediastinum/Nodes: Residual material demonstrated in the esophagus without significant dilatation, likely dysmotility or reflux. Thyroid gland is unremarkable. No significant lymphadenopathy. Lungs/Pleura: Linear atelectasis in the lung bases. No focal consolidation. No pleural effusions. No pneumothorax. Upper Abdomen: No acute abnormalities demonstrated in the visualized upper abdomen. Musculoskeletal: Old right rib fractures. Degenerative changes in the spine. Review of the MIP images confirms the above findings. IMPRESSION: 1. No evidence of significant pulmonary embolus. 2. Atelectasis in the lung bases. 3. Residual material in the esophagus likely indicating dysmotility or reflux. 4. Aortic atherosclerosis. Electronically Signed   By: Burman Nieves M.D.   On: 04/23/2021 22:49   DG Chest Port 1 View  Result Date: 04/23/2021 CLINICAL DATA:  New oxygen requirement, syncope EXAM: PORTABLE CHEST 1 VIEW COMPARISON:  08/28/2018 FINDINGS: Single frontal view of the chest demonstrates an unremarkable cardiac silhouette. Minimal scarring or subsegmental atelectasis within the right upper lobe abutting the minor fissure. No  airspace disease, effusion, or pneumothorax. No acute bony abnormalities. IMPRESSION: 1. No acute intrathoracic process. Electronically Signed   By: Sharlet Salina M.D.   On: 04/23/2021 18:21    Cardiac Studies   TTE 12-15-17 Left ventricle: The cavity size was normal. Wall thickness was    increased in a pattern of mild LVH. Systolic function was normal.    The estimated ejection fraction was in the range of 60% to 65%.    Wall motion was normal; there were no regional wall motion    abnormalities. Doppler parameters are consistent with abnormal    left ventricular relaxation (grade 1 diastolic dysfunction).  - Aortic valve: There was no stenosis.  - Mitral valve: There was no significant regurgitation.  - Right ventricle: The cavity size was normal. Systolic function    was normal.  - Tricuspid valve: Peak RV-RA gradient (S): 31 mm Hg.  - Pulmonary arteries: PA peak pressure: 34 mm Hg (S).  - Inferior vena cava: The vessel was normal in size. The    respirophasic diameter changes were in the normal range (>= 50%),    consistent with normal central venous pressure.  Impressions:   - Normal LV size with mild LV hypertrophy. EF 60-65%. Normal RV    size and systolic function. No significant valvular    abnormalities.   Patient Profile     86 y.o. male with a hx of dementia, anxiety, HTN, dyslipidemia, PAD, hypothyroidism but no prior h/o CAD/HF or documented arrhythmia now admitted after found on floor asleep.  Possible fall.  Troponin elevated in ER 226 to 198.   Assessment & Plan    Elevated troponin- in pt found asleep on floor, unsure fall or syncope.  No angina no hx of CAD.  Plan for TTE, QTc prolonged on EKG. No atrial fib -This AM hs troponin down to 111 from 198 - neg CTA of chest for PE, did have residual food in esophagus.(Ddimer was elevated) -mild chest discomfort just rt of sternum  Prolonged Qt monitor -ordered EKG for this AM and Qtc 453 ms improved and inf st  cahnges improved does have 1st degree AV block with PR 260 ms  HTN/dyslipidemia continue home meds.  LDL 75 in pt with no known CAD       For questions or updates, please contact [facility] Please consult [email or web address] for contact info under        Signed, Nada Boozer, NP  04/24/2021, 7:49 AM

## 2021-04-24 NOTE — Plan of Care (Signed)

## 2021-04-24 NOTE — Discharge Instructions (Signed)
FOLLOW-UP INSTRUCTIONS:  Thank you for allowing [place] to be part of your care. You were hospitalized for Unwitnessed fall.  Please follow up with the following providers: A. PCP, Dr. Loleta Chance in 1 week for hospital follow up   Please note these changes made to your medications:   A. Medications to continue: Current Meds  Medication Sig   aspirin EC 81 MG tablet Take 81 mg by mouth daily. Swallow whole.   atorvastatin (LIPITOR) 20 MG tablet Take 20 mg by mouth daily.   calcium-vitamin D (OSCAL WITH D) 500-200 MG-UNIT TABS tablet Take 1 tablet by mouth in the morning and at bedtime.   DULoxetine (CYMBALTA) 60 MG capsule Take 60 mg by mouth daily.    fexofenadine (ALLEGRA) 180 MG tablet Take 180 mg by mouth daily.   guaiFENesin-dextromethorphan (ROBITUSSIN DM) 100-10 MG/5ML syrup Take 5 mLs by mouth every 4 (four) hours as needed for cough.   ibandronate (BONIVA) 150 MG tablet Take 150 mg by mouth every 30 (thirty) days. Take in the morning with a full glass of water, on an empty stomach, and do not take anything else by mouth or lie down for the next 30 min.   levothyroxine (SYNTHROID, LEVOTHROID) 75 MCG tablet Take 75 mcg by mouth daily before breakfast.   LORazepam (ATIVAN) 0.5 MG tablet Take 0.5 mg by mouth at bedtime. sleep   losartan (COZAAR) 50 MG tablet Take 1 tablet (50 mg total) by mouth every evening.   morphine (MS CONTIN) 30 MG 12 hr tablet Take 30 mg by mouth 2 (two) times daily as needed for pain.   morphine (MSIR) 15 MG tablet Take 15 mg by mouth every 4 (four) hours as needed for severe pain.   pantoprazole (PROTONIX) 40 MG tablet Take 40 mg by mouth daily.   polyethylene glycol (MIRALAX / GLYCOLAX) 17 g packet Take 17 g by mouth daily as needed for mild constipation.   pregabalin (LYRICA) 75 MG capsule Take 75 mg by mouth 2 (two) times daily.    torsemide (DEMADEX) 20 MG tablet Take 2 tablets (40 mg total) by mouth daily.            B. Medications to discontinue:   Phenergan Trazodone    Please make sure to follow up with your primary care physician/cardiologist, Dr. Loleta Chance in 1 week for hospital follow up.  Please call our clinic if you have any questions or concerns, we may be able to help and keep you from a long and expensive emergency room wait. Our clinic and after hours phone number is [phone], the best time to call is Monday through Friday 9 am to 4 pm but there is always someone available 24/7 if you have an emergency. If you need medication refills please notify your pharmacy one week in advance and they will send [place] a request.

## 2021-04-24 NOTE — Evaluation (Signed)
Occupational Therapy Evaluation Patient Details Name: Kenneth Brandt MRN: [id number] DOB: August 31, 1934 Today's Date: 04/24/2021    History of Present Illness Pt is an 86 y/o male admitted 8/25 after being found on the floor. Found to have elevated troponins and further workup pending. PMH includes dementia, anxiety, HTN, and PAD.   Clinical Impression   Pt admitted with concerns listed above. PTA pt reported that he was independent with all ADL's, using no DME. At this time, pt requiring supervision for OOB mobility and was mod I-supervision for all ADL's. Overall pt' balance appears intact, however he would benefit from using a RW for safety, pt aware and agreeable. Pt does not need OT services at this time and acute OT will sign off.     Follow Up Recommendations  No OT follow up;Supervision - Intermittent    Equipment Recommendations  None recommended by OT    Recommendations for Other Services       Precautions / Restrictions Precautions Precautions: Fall Restrictions Weight Bearing Restrictions: No      Mobility Bed Mobility Overal bed mobility: Needs Assistance Bed Mobility: Supine to Sit     Supine to sit: Min assist     General bed mobility comments: Up in recliner    Transfers Overall transfer level: Needs assistance Equipment used: None Transfers: Sit to/from [employer] Sit to Stand: Supervision Stand pivot transfers: Supervision       General transfer comment: Sup for safety to stand. Mild shakiness noted.    Balance Overall balance assessment: Needs assistance Sitting-balance support: No upper extremity supported;Feet supported Sitting balance-Leahy Scale: Good     Standing balance support: No upper extremity supported;During functional activity Standing balance-Leahy Scale: Fair                             ADL either performed or assessed with clinical judgement   ADL Overall ADL's : At baseline;Modified  independent                                       General ADL Comments: Pt overall at baseline with all ADL's, would be safer with RW , which was discussed, pt reported that he would think about it, as he has a rollator and RW at home. Pt completed room distance mobility and dynamix standing at the sink and EOB with no difficulty. Pt reporting endurance feels a little low at this time.     Vision Baseline Vision/History: 1 Wears glasses Ability to See in Adequate Light: 0 Adequate Patient Visual Report: No change from baseline Vision Assessment?: No apparent visual deficits     Perception Perception Perception Tested?: No   Praxis Praxis Praxis tested?: Not tested    Pertinent Vitals/Pain Pain Assessment: No/denies pain     Hand Dominance Right   Extremity/Trunk Assessment Upper Extremity Assessment Upper Extremity Assessment: Overall WFL for tasks assessed   Lower Extremity Assessment Lower Extremity Assessment: Defer to PT evaluation   Cervical / Trunk Assessment Cervical / Trunk Assessment: Kyphotic   Communication Communication Communication: No difficulties   Cognition Arousal/Alertness: Awake/alert Behavior During Therapy: WFL for tasks assessed/performed Overall Cognitive Status: History of cognitive impairments - at baseline  General Comments: per chart, hx of dementia. Appropriate throughout session   General Comments  VSS on RA    Exercises     Shoulder Instructions      Home Living Family/patient expects to be discharged to:: Private residence Living Arrangements: Spouse/significant other Available Help at Discharge: Family;Available 24 hours/day Type of Home: Apartment Home Access: Level entry     Home Layout: One level     Bathroom Shower/Tub: Chief Strategy Officer: Standard     Home Equipment: Environmental consultant - 4 wheels;Cane - single point;Shower seat;Grab bars -  tub/shower;Bedside commode;Walker - 2 wheels          Prior Functioning/Environment Level of Independence: Needs assistance  Gait / Transfers Assistance Needed: Pt reports he does not use AD. ADL's / Homemaking Assistance Needed: REports he has assist for housekeeping. Reports independence with ADLs            OT Problem List: Decreased strength;Decreased activity tolerance;Impaired balance (sitting and/or standing);Decreased safety awareness;Decreased knowledge of use of DME or AE      OT Treatment/Interventions:      OT Goals(Current goals can be found in the care plan section) Acute Rehab OT Goals Patient Stated Goal: to go home OT Goal Formulation: All assessment and education complete, DC therapy Time For Goal Achievement: 04/24/21 Potential to Achieve Goals: Good  OT Frequency:     Barriers to D/C:            Co-evaluation              AM-PAC OT "6 Clicks" Daily Activity     Outcome Measure Help from another person eating meals?: None Help from another person taking care of personal grooming?: None Help from another person toileting, which includes using toliet, bedpan, or urinal?: None Help from another person bathing (including washing, rinsing, drying)?: None Help from another person to put on and taking off regular upper body clothing?: None Help from another person to put on and taking off regular lower body clothing?: None 6 Click Score: 24   End of Session Equipment Utilized During Treatment: Gait belt Nurse Communication: Mobility status  Activity Tolerance: Patient tolerated treatment well Patient left: in chair;with call bell/phone within reach  OT Visit Diagnosis: Unsteadiness on feet (R26.81);Other abnormalities of gait and mobility (R26.89);Muscle weakness (generalized) (M62.81)                Time: 0300-9233 OT Time Calculation (min): 13 min Charges:  OT General Charges $OT Visit: 1 Visit OT Evaluation $OT Eval Low Complexity: 1  Low  Meira Wahba H., OTR/L Acute Rehabilitation  Deaundra Kutzer Elane Bing Plume 04/24/2021, 1:58 PM

## 2021-04-24 NOTE — Progress Notes (Signed)
Nursing Discharge note  Patient alert and orientedx4.Dc instructions reviewed with patient and he verbalized understanding. Copy provided to patient. VSS. Daughter Waldon Merl notified of dc order and she is cominng in about 40 minutes to take patient home. Primary RN made aware.

## 2021-04-29 ENCOUNTER — Ambulatory Visit: Payer: Medicare (Managed Care) | Admitting: Medical

## 2021-07-01 ENCOUNTER — Ambulatory Visit (INDEPENDENT_AMBULATORY_CARE_PROVIDER_SITE_OTHER): Payer: Medicare (Managed Care) | Admitting: Medical

## 2021-07-01 ENCOUNTER — Other Ambulatory Visit: Payer: Self-pay

## 2021-07-01 ENCOUNTER — Encounter: Payer: Self-pay | Admitting: Medical

## 2021-07-01 VITALS — BP 104/54 | HR 70 | Ht 67.0 in | Wt 210.8 lb

## 2021-07-01 DIAGNOSIS — E785 Hyperlipidemia, unspecified: Secondary | ICD-10-CM

## 2021-07-01 DIAGNOSIS — I2510 Atherosclerotic heart disease of native coronary artery without angina pectoris: Secondary | ICD-10-CM | POA: Diagnosis not present

## 2021-07-01 DIAGNOSIS — I10 Essential (primary) hypertension: Secondary | ICD-10-CM | POA: Diagnosis not present

## 2021-07-01 NOTE — Progress Notes (Signed)
Cardiology Office Note   Date:  07/01/2021   ID:  Kenneth Brandt, DOB April 27, 1935, MRN [id number]  PCP:  Oneita Hurt, No  Cardiologist:  Chrystie Nose, MD EP: None  Chief Complaint  Patient presents with   Hospitalization Follow-up    Mildly elevated troponins      History of Present Illness: Kenneth Brandt is a 86 y.o. male with a PMH of HTN, HLD, PUD/GERD, hypothyroidism, anxiety, mild dementia who presents for hospital follow-up.  He was admitted to the hospital 04/23/2021 - 04/24/2021 after presenting with an unwitnessed fall.  Patient cannot recall the events surrounding his fall.  He was mildly hypoxic on EMS arrival with soft blood pressures which improved with IV fluids.  He had some lower extremity edema and mild crackles on lung exam.  EKG was nonischemic with mildly prolonged QT for which his trazodone was discontinued and QTC prolongation resolved.  HsTrops or mildly elevated to peak 226 and trended down.  CTA of the chest was negative for PE echocardiogram that admission showed EF 65 to 70%, indeterminate LV wall motion, G1DD, mild LVH, and no significant valvular abnormalities..  Cardiology was consulted and no further cardiac testing was recommended.  He does not appear to have had prior ischemic testing though does have evidence of coronary artery and aortic calcifications on CTA chest 03/2021.  He presents today for hospital follow-up.  He reports doing well over the past couple months.  He has not had any recurrent falls.  He reports occasional dizziness when standing up quickly but thankfully no syncope.  We reviewed orthostatic precautions and discussed use of compression stockings to minimize symptoms if persists.  He states these episodes are rare.  He notes occasional right-sided chest discomfort consistent with previous trouble with shingles, but no exertional chest pain.  He has not had any shortness of breath, DOE, palpitations, orthopnea, PND.  He has stable lower  extremity edema.    Past Medical History:  Diagnosis Date   Anxiety    Arthritis    Chronic back pain    Dementia (HCC)    Depression    Dyspnea    occasionally   Essential tremor    GERD (gastroesophageal reflux disease)    Hepatitis    A -treated   HSV infection    Hypercholesteremia    Hypertension    Hypothyroid    Neuromuscular disorder (HCC)    PAD (peripheral artery disease) (HCC)    Seasonal allergies    Wears glasses    Wears partial dentures    upper and lower    Past Surgical History:  Procedure Laterality Date   ANKLE FRACTURE SURGERY Right    APPENDECTOMY     remote   BACK SURGERY  2009, 2011   laminectomies   BIOPSY  11/21/2019   Procedure: BIOPSY;  Surgeon: Graylin Shiver, MD;  Location: [facility];  Service: Endoscopy;;   CARDIOVASCULAR STRESS TEST     04/25/15 Nuclear stress test [facility]): No definite evidence for inducible ischemia or infarct, EF 74%   CATARACT EXTRACTION Bilateral    CHOLECYSTECTOMY N/A 02/14/2020   Procedure: LAPAROSCOPIC CHOLECYSTECTOMY;  Surgeon: Harriette Bouillon, MD;  Location: [facility] OR;  Service: General;  Laterality: N/A;   ESOPHAGOGASTRODUODENOSCOPY (EGD) WITH PROPOFOL N/A 11/21/2019   Procedure: ESOPHAGOGASTRODUODENOSCOPY (EGD) WITH PROPOFOL;  Surgeon: Graylin Shiver, MD;  Location: [facility];  Service: Endoscopy;  Laterality: N/A;   IR KYPHO LUMBAR INC FX REDUCE BONE BX UNI/BIL CANNULATION  INC/IMAGING  03/14/2018   IR SACROPLASTY BILATERAL  08/18/2018   REPLACEMENT TOTAL KNEE Right 2012   RETINAL DETACHMENT SURGERY Right    TONSILLECTOMY     TOTAL KNEE ARTHROPLASTY Left 02/22/2017   TOTAL KNEE ARTHROPLASTY Left 02/22/2017   Procedure: LEFT TOTAL KNEE ARTHROPLASTY;  Surgeon: Sheral Apley, MD;  Location: [facility] OR;  Service: Orthopedics;  Laterality: Left;     Current Outpatient Medications  Medication Sig Dispense Refill   aspirin EC 81 MG tablet Take 81 mg by mouth daily. Swallow whole.     atorvastatin  (LIPITOR) 20 MG tablet Take 20 mg by mouth daily.     calcium-vitamin D (OSCAL WITH D) 500-200 MG-UNIT TABS tablet Take 1 tablet by mouth in the morning and at bedtime.     DULoxetine (CYMBALTA) 60 MG capsule Take 60 mg by mouth daily.      fexofenadine (ALLEGRA) 180 MG tablet Take 180 mg by mouth daily.     guaiFENesin-dextromethorphan (ROBITUSSIN DM) 100-10 MG/5ML syrup Take 5 mLs by mouth every 4 (four) hours as needed for cough. 118 mL 1   ibandronate (BONIVA) 150 MG tablet Take 150 mg by mouth every 30 (thirty) days. Take in the morning with a full glass of water, on an empty stomach, and do not take anything else by mouth or lie down for the next 30 min.     levothyroxine (SYNTHROID, LEVOTHROID) 75 MCG tablet Take 75 mcg by mouth daily before breakfast.     LORazepam (ATIVAN) 0.5 MG tablet Take 0.5 mg by mouth at bedtime. sleep     losartan (COZAAR) 50 MG tablet Take 1 tablet (50 mg total) by mouth every evening.     morphine (MS CONTIN) 30 MG 12 hr tablet Take 30 mg by mouth 2 (two) times daily as needed for pain.     morphine (MSIR) 15 MG tablet Take 15 mg by mouth every 4 (four) hours as needed for severe pain.     pantoprazole (PROTONIX) 40 MG tablet Take 40 mg by mouth daily.     polyethylene glycol (MIRALAX / GLYCOLAX) 17 g packet Take 17 g by mouth daily as needed for mild constipation.     pregabalin (LYRICA) 75 MG capsule Take 75 mg by mouth 2 (two) times daily.      torsemide (DEMADEX) 20 MG tablet Take 2 tablets (40 mg total) by mouth daily.     No current facility-administered medications for this visit.    Allergies:   Lactose intolerance (gi)    Social History:  The patient  reports that he has never smoked. He has never used smokeless tobacco. He reports that he does not drink alcohol and does not use drugs.   Family History:  The patient's family history includes CAD in his brother and father; Cancer in his mother and another family member; Pneumonia in his mother.     ROS:  Please see the history of present illness.   Otherwise, review of systems are positive for none.   All other systems are reviewed and negative.    PHYSICAL EXAM: VS:  BP (!) 104/54   Pulse 70   Ht 5\' 7"  (1.702 m)   Wt 210 lb 12.8 oz (95.6 kg)   SpO2 97%   BMI 33.02 kg/m  , BMI Body mass index is 33.02 kg/m. GEN: Well nourished, well developed, in no acute distress HEENT: Sclera anicteric Neck: no JVD, carotid bruits, or masses Cardiac: RRR; no murmurs, rubs, or  gallops, trace LE edema  Respiratory:  clear to auscultation bilaterally, normal work of breathing GI: soft, obese, nontender, nondistended, + BS MS: no deformity or atrophy Skin: warm and dry, no rash Neuro:  Strength and sensation are intact Psych: euthymic mood, full affect   EKG:  EKG is ordered today. The ekg ordered today demonstrates sinus rhythm with first-degree AV block 6 bpm, no TWI, no ST E/D, PVC/PAC, QTC 421   Recent Labs: 04/23/2021: ALT 12; B Natriuretic Peptide 40.8 04/24/2021: BUN 9; Creatinine, Ser 1.02; Hemoglobin 11.4; Magnesium 1.9; Platelets 169; Potassium 3.7; Sodium 138; TSH 1.377    Lipid Panel    Component Value Date/Time   CHOL 119 04/24/2021 0616   TRIG 79 04/24/2021 0616   HDL 28 (L) 04/24/2021 0616   CHOLHDL 4.3 04/24/2021 0616   VLDL 16 04/24/2021 0616   LDLCALC 75 04/24/2021 0616      Wt Readings from Last 3 Encounters:  07/01/21 210 lb 12.8 oz (95.6 kg)  04/23/21 230 lb 13.2 oz (104.7 kg)  09/11/20 230 lb 12.8 oz (104.7 kg)      Other studies Reviewed: Additional studies/ records that were reviewed today include:   Echocardiogram 03/2021: 1. Left ventricular ejection fraction, by estimation, is 65 to 70%. The  left ventricle has normal function. Left ventricular endocardial border  not optimally defined to evaluate regional wall motion. There is mild left  ventricular hypertrophy. Left  ventricular diastolic parameters are consistent with Grade I diastolic   dysfunction (impaired relaxation).   2. Right ventricular systolic function is normal. The right ventricular  size is normal. There is normal pulmonary artery systolic pressure. The  estimated right ventricular systolic pressure is 32.2 mmHg.   3. The mitral valve is grossly normal. Trivial mitral valve  regurgitation. No evidence of mitral stenosis.   4. The aortic valve is grossly normal. Aortic valve regurgitation is not  visualized. No aortic stenosis is present.   5. The inferior vena cava is normal in size with <50% respiratory  variability, suggesting right atrial pressure of 8 mmHg.     ASSESSMENT AND PLAN:  1.  Coronary artery calcifications: Noted on CTA of the chest 03/2021 during a hospitalization after presenting with an unwitnessed fall. HsTrop peaked at 226 and trended down.  He had no ischemic changes on EKG.  Echocardiogram that admission was reassuring with EF 65-70%, G1 DD, though unable to assess LV wall motion.  He has not had any anginal complaints.  EKG today is nonischemic.  He reports having a negative stress test at the [place] 5 to 10 years ago.  Given lack of symptoms do not feel strongly about pursuing an ischemic evaluation at this time -Recommend ongoing aggressive risk factor modifications for goal BP <130/80, LDL <70, A1c <7   2. HTN: BP 104/54 today.  He reports it is usually higher than this at home.  Rare orthostatic symptoms for which we reviewed orthostatic precautions and discussed use of compression stockings - Will losartan and torsemide  3. HLD: LDL 75 03/2021, near goal of <70 - Continue atorvastatin 20 mg daily   Current medicines are reviewed at length with the patient today.  The patient does not have concerns regarding medicines.  The following changes have been made: As above  Labs/ tests ordered today include:   Orders Placed This Encounter  Procedures   EKG 12-Lead     Disposition:   FU with Dr. Rennis Golden in 6 months  Signed, Ovidio Kin.  Sheneika Walstad, PA-C  07/01/2021 2:20 PM

## 2021-07-01 NOTE — Patient Instructions (Signed)
Medication Instructions:  Your physician recommends that you continue on your current medications as directed. Please refer to the Current Medication list given to you today.  *If you need a refill on your cardiac medications before your next appointment, please call your pharmacy*  Lab Work: NONE ordered at this time of appointment   If you have labs (blood work) drawn today and your tests are completely normal, you will receive your results only by: . MyChart Message (if you have MyChart) OR . A paper copy in the mail If you have any lab test that is abnormal or we need to change your treatment, we will call you to review the results.  Testing/Procedures: NONE ordered at this time of appointment   Follow-Up: At [facility], you and your health needs are our priority.  As part of our continuing mission to provide you with exceptional heart care, we have created designated Provider Care Teams.  These Care Teams include your primary Cardiologist (physician) and Advanced Practice Providers (APPs -  Physician Assistants and Nurse Practitioners) who all work together to provide you with the care you need, when you need it.  We recommend signing up for the patient portal called "MyChart".  Sign up information is provided on this After Visit Summary.  MyChart is used to connect with patients for Virtual Visits (Telemedicine).  Patients are able to view lab/test results, encounter notes, upcoming appointments, etc.  Non-urgent messages can be sent to your provider as well.   To learn more about what you can do with MyChart, go to [email or web address].    Your next appointment:   6 month(s)  The format for your next appointment:   In Person  Provider:   K. Chad Hilty, MD  Other Instructions   

## 2021-12-23 ENCOUNTER — Other Ambulatory Visit: Payer: Self-pay | Admitting: Family Medicine

## 2021-12-23 ENCOUNTER — Ambulatory Visit
Admission: RE | Admit: 2021-12-23 | Discharge: 2021-12-23 | Disposition: A | Discharge status: Home / Self Care | Payer: Medicare (Managed Care) | Source: Ambulatory Visit | Attending: Family Medicine | Admitting: Family Medicine

## 2021-12-23 DIAGNOSIS — R0602 Shortness of breath: Secondary | ICD-10-CM

## 2021-12-25 ENCOUNTER — Ambulatory Visit (INDEPENDENT_AMBULATORY_CARE_PROVIDER_SITE_OTHER): Payer: Medicare (Managed Care) | Admitting: Physician Assistant

## 2021-12-25 VITALS — BP 132/68 | HR 74 | Ht 67.0 in | Wt 217.0 lb

## 2021-12-25 DIAGNOSIS — E785 Hyperlipidemia, unspecified: Secondary | ICD-10-CM | POA: Diagnosis not present

## 2021-12-25 DIAGNOSIS — R0609 Other forms of dyspnea: Secondary | ICD-10-CM

## 2021-12-25 DIAGNOSIS — I10 Essential (primary) hypertension: Secondary | ICD-10-CM

## 2021-12-25 NOTE — Patient Instructions (Addendum)
Medication Instructions:  ?Your physician recommends that you continue on your current medications as directed. Please refer to the Current Medication list given to you today. ? ?*If you need a refill on your cardiac medications before your next appointment, please call your pharmacy* ? ?Lab Work: ?Your physician recommends that you return for lab work TODAY:  ?BMET ?CBC ?TSH ? ?If you have labs (blood work) drawn today and your tests are completely normal, you will receive your results only by: ?MyChart Message (if you have MyChart) OR ?A paper copy in the mail ?If you have any lab test that is abnormal or we need to change your treatment, we will call you to review the results. ? ?Testing/Procedures: ?CARDIAC PET- Your physician has requested that you have a Cardiac Pet Stress Test. This testing is completed at [facility] ([address] [place], [place] [address]). The schedulers will call you to get this scheduled. Please follow instructions below and call the office with any questions/concerns [phone]). ? ?Follow-Up: ?At [facility], you and your health needs are our priority.  As part of our continuing mission to provide you with exceptional heart care, we have created designated Provider Care Teams.  These Care Teams include your primary Cardiologist (physician) and Advanced Practice Providers (APPs -  Physician Assistants and Nurse Practitioners) who all work together to provide you with the care you need, when you need it. ? ?We recommend signing up for the patient portal called "MyChart".  Sign up information is provided on this After Visit Summary.  MyChart is used to connect with patients for Virtual Visits (Telemedicine).  Patients are able to view lab/test results, encounter notes, upcoming appointments, etc.  Non-urgent messages can be sent to your provider as well.   ?To learn more about what you can do with MyChart, go to [email or web address].   ? ?Your next  appointment:   ?1 year(s) ? ?The format for your next appointment:   ?In Person ? ?Provider:   ?Chrystie Nose, MD   ? ?Other Instructions ?How to Prepare for Your Cardiac PET/CT Stress Test: ? ?1. Please do not take these medications before your test:  ? ?Medications that may interfere with the cardiac pharmacological stress agent (ex. nitrates or beta-blockers) the day of the exam. ?Theophylline containing medications for 12 hours. ?Dipyridamole 48 hours prior to the test. ?Your remaining medications may be taken with water. ? ?2. Nothing to eat or drink, except water, 3 hours prior to arrival time.   ?NO caffeine/decaffeinated products, or chocolate 12 hours prior to arrival. ? ?3. NO perfume, cologne or lotion ? ?4. Total time is 1 to 2 hours; you may want to bring reading material for the waiting time. ? ?5. Please report to Admitting at the [facility] Main Entrance 60 minutes early for your test. ? [address] [place] ? [place], [place] [address] ? ?Diabetic Preparation:  ?Hold oral medications. ?You may take NPH and Lantus insulin. ?Do not take Humalog or Humulin R (Regular Insulin) the day of your test. ?Check blood sugars prior to leaving the house. ?If able to eat breakfast prior to 3 hour fasting, you may take all medications, including your insulin, ?Do not worry if you miss your breakfast dose of insulin - start at your next meal. ? ?IF YOU THINK YOU MAY BE PREGNANT, OR ARE NURSING PLEASE INFORM THE TECHNOLOGIST. ? ?In preparation for your appointment, medication and supplies will be purchased.  Appointment availability is limited, so  if you need to cancel or reschedule, please call the Radiology Department at [phone]  24 hours in advance to avoid a cancellation fee of $100.00 ? ?What to Expect After you Arrive: ? ?Once you arrive and check in for your appointment, you will be taken to a preparation room within the Radiology Department.  A technologist or Nurse will obtain your medical  history, verify that you are correctly prepped for the exam, and explain the procedure.  Afterwards,  an IV will be started in your arm and electrodes will be placed on your skin for EKG monitoring during the stress portion of the exam. Then you will be escorted to the PET/CT scanner.  There, staff will get you positioned on the scanner and obtain a blood pressure and EKG.  During the exam, you will continue to be connected to the EKG and blood pressure machines.  A small, safe amount of a radioactive tracer will be injected in your IV to obtain a series of pictures of your heart along with an injection of a stress agent.   ? ?After your Exam: ? ?It is recommended that you eat a meal and drink a caffeinated beverage to counter act any effects of the stress agent.  Drink plenty of fluids for the remainder of the day and urinate frequently for the first couple of hours after the exam.  Your doctor will inform you of your test results within 7-10 business days. ? ?For questions about your test or how to prepare for your test, please call: ?Rockwell Alexandria, Cardiac Imaging Nurse Navigator  ?Larey Brick, Cardiac Imaging Nurse Navigator ?Office: [phone] ? ? ?Important Information About Sugar ? ? ? ? ? ? ?

## 2021-12-25 NOTE — Progress Notes (Signed)
?Cardiology Office Note:   ? ?Date:  12/27/2021  ? ?ID:  Kenneth Brandt, DOB 05-15-1935, MRN [id number] ? ?PCP:  Pcp, No ?  ?[facility] Providers ?Cardiologist:  Chrystie Nose, MD    ? ?Referring MD: No ref. provider found  ? ?Chief Complaint  ?Patient presents with  ? Follow-up  ?  Seen for Dr. Rennis Golden  ? ? ?History of Present Illness:   ? ?Kenneth Brandt is a 87 y.o. male with a hx of HTN, HLD, PUD/GERD, hypothyroidism, anxiety and mild dementia.  Patient was previously admitted in August 2022 after presenting with unwitnessed fall.  He could not recount the events surrounding his fall.  He was mildly hypoxic on EMS arrival with a soft blood pressure that improved with IV fluid.  He had a some lower extremity edema and mild crackles on exam.  EKG was nonischemic with mildly prolonged QT interval.  His trazodone was discontinued due to prolonged QT.  Serial troponin elevated to 226 before trending down.  CTA of the chest negative for PE.  Echocardiogram obtained during the admission showed EF 65 to 70%, indeterminate LV wall motion, grade 1 DD, mild LVH, no significant valve issue.  Cardiology service was consulted, no further cardiac testing was recommended.  He did have coronary artery calcification seen on the CTA of chest.  Given lack of ischemic symptoms, no ischemic work-up was pursued.  Patient was seen by Blima Rich, PA-C of again on 07/01/2021 at which time he was doing well.  He reported occasional dizziness when standing up too quickly, orthostatic fall precaution was discussed with the patient. ? ?Patient presents today for follow-up along with his wife.  His daughter listened in on his cell phone.  He denies any recent chest pain, however family member has been noticing worsening dyspnea on exertion lately.  He has no significant lower extremity edema, orthopnea or PND.  He is chronically fatigued and that that has not changed much.  He is no longer on the losartan, blood pressure is slightly  higher than in November however still within normal limit.  I recommended standard blood work with CBC, basic metabolic panel and a TSH.  EKG today showed normal sinus rhythm with frequent PACs, first-degree AV block.  Given the worsening exertional shortness of breath, I also recommend a PET stress test.  She has multiple members of his family including sister, father and grandparents who all had early CAD.  He is quite concerned about his cardiovascular risk.  If the PET stress test is normal, he may delay his next follow-up until a year from now. ? ? ?Past Medical History:  ?Diagnosis Date  ? Anxiety   ? Arthritis   ? Chronic back pain   ? Dementia (HCC)   ? Depression   ? Dyspnea   ? occasionally  ? Essential tremor   ? GERD (gastroesophageal reflux disease)   ? Hepatitis   ? A -treated  ? HSV infection   ? Hypercholesteremia   ? Hypertension   ? Hypothyroid   ? Neuromuscular disorder (HCC)   ? PAD (peripheral artery disease) (HCC)   ? Seasonal allergies   ? Wears glasses   ? Wears partial dentures   ? upper and lower  ? ? ?Past Surgical History:  ?Procedure Laterality Date  ? ANKLE FRACTURE SURGERY Right   ? APPENDECTOMY    ? remote  ? BACK SURGERY  2009, 2011  ? laminectomies  ? BIOPSY  11/21/2019  ?  Procedure: BIOPSY;  Surgeon: Graylin ShiverGanem, Salem F, MD;  Location: [facility];  Service: Endoscopy;;  ? CARDIOVASCULAR STRESS TEST    ? 04/25/15 Nuclear stress test [facility] No definite evidence for inducible ischemia or infarct, EF 74%  ? CATARACT EXTRACTION Bilateral   ? CHOLECYSTECTOMY N/A 02/14/2020  ? Procedure: LAPAROSCOPIC CHOLECYSTECTOMY;  Surgeon: Harriette Bouillonornett, Thomas, MD;  Location: [facility];  Service: General;  Laterality: N/A;  ? ESOPHAGOGASTRODUODENOSCOPY (EGD) WITH PROPOFOL N/A 11/21/2019  ? Procedure: ESOPHAGOGASTRODUODENOSCOPY (EGD) WITH PROPOFOL;  Surgeon: Graylin ShiverGanem, Salem F, MD;  [facility];  Service: Endoscopy;  Laterality: N/A;  ? IR KYPHO LUMBAR INC FX REDUCE BONE BX UNI/BIL CANNULATION  INC/IMAGING  03/14/2018  ? IR SACROPLASTY BILATERAL  08/18/2018  ? REPLACEMENT TOTAL KNEE Right 2012  ? RETINAL DETACHMENT SURGERY Right   ? TONSILLECTOMY    ? TOTAL KNEE ARTHROPLASTY Left 02/22/2017  ? TOTAL KNEE ARTHROPLASTY Left 02/22/2017  ? Procedure: LEFT TOTAL KNEE ARTHROPLASTY;  Surgeon: Sheral ApleyMurphy, Timothy D, MD;  Location: [facility] OR;  Service: Orthopedics;  Laterality: Left;  ? ? ?Current Medications: ?Current Meds  ?Medication Sig  ? aspirin EC 81 MG tablet Take 81 mg by mouth daily. Swallow whole.  ? atorvastatin (LIPITOR) 20 MG tablet Take 20 mg by mouth daily.  ? calcium-vitamin D (OSCAL WITH D) 500-200 MG-UNIT TABS tablet Take 1 tablet by mouth in the morning and at bedtime.  ? DULoxetine (CYMBALTA) 60 MG capsule Take 60 mg by mouth daily.   ? fexofenadine (ALLEGRA) 180 MG tablet Take 180 mg by mouth daily.  ? guaiFENesin-dextromethorphan (ROBITUSSIN DM) 100-10 MG/5ML syrup Take 5 mLs by mouth every 4 (four) hours as needed for cough.  ? ibandronate (BONIVA) 150 MG tablet Take 150 mg by mouth every 30 (thirty) days. Take in the morning with a full glass of water, on an empty stomach, and do not take anything else by mouth or lie down for the next 30 min.  ? levothyroxine (SYNTHROID, LEVOTHROID) 75 MCG tablet Take 75 mcg by mouth daily before breakfast.  ? LORazepam (ATIVAN) 0.5 MG tablet Take 0.5 mg by mouth at bedtime. sleep  ? morphine (MS CONTIN) 30 MG 12 hr tablet Take 30 mg by mouth 2 (two) times daily as needed for pain.  ? morphine (MSIR) 15 MG tablet Take 15 mg by mouth every 4 (four) hours as needed for severe pain.  ? pantoprazole (PROTONIX) 40 MG tablet Take 40 mg by mouth daily.  ? polyethylene glycol (MIRALAX / GLYCOLAX) 17 g packet Take 17 g by mouth daily as needed for mild constipation.  ? pregabalin (LYRICA) 75 MG capsule Take 75 mg by mouth 2 (two) times daily.   ? torsemide (DEMADEX) 20 MG tablet Take 2 tablets (40 mg total) by mouth daily.  ?  ? ?Allergies:   Lactose intolerance (gi)   ? ?Social History  ? ?Socioeconomic History  ? Marital status: Married  ?  Spouse name: Beecher McardleJacque  ? Number of children: 3  ? Years of education: college  ? Highest education level: Not on file  ?Occupational History  ? Occupation: retired  ?  Comment: former Education officer, environmentalpastor, missionary  ?Tobacco Use  ? Smoking status: Never  ? Smokeless tobacco: Never  ?Vaping Use  ? Vaping Use: Never used  ?Substance and Sexual Activity  ? Alcohol use: No  ? Drug use: No  ? Sexual activity: Not Currently  ?Other Topics Concern  ? Not on file  ?Social History Narrative  ? Lives at  home with wife, caregiver  ? Caffeine- coffee, 1 cup daily, coke occasionally  ? ?Social Determinants of Health  ? ?Financial Resource Strain: Not on file  ?Food Insecurity: Not on file  ?Transportation Needs: Not on file  ?Physical Activity: Not on file  ?Stress: Not on file  ?Social Connections: Not on file  ?  ? ?Family History: ?The patient's family history includes CAD in his brother and father; Cancer in his mother and another family member; Pneumonia in his mother. There is no history of Stroke. ? ?ROS:   ?Please see the history of present illness.    ? All other systems reviewed and are negative. ? ?EKGs/Labs/Other Studies Reviewed:   ? ?The following studies were reviewed today: ? ?Echo 04/24/2021 ? 1. Left ventricular ejection fraction, by estimation, is 65 to 70%. The  ?left ventricle has normal function. Left ventricular endocardial border  ?not optimally defined to evaluate regional wall motion. There is mild left  ?ventricular hypertrophy. Left  ?ventricular diastolic parameters are consistent with Grade I diastolic  ?dysfunction (impaired relaxation).  ? 2. Right ventricular systolic function is normal. The right ventricular  ?size is normal. There is normal pulmonary artery systolic pressure. The  ?estimated right ventricular systolic pressure is 32.2 mmHg.  ? 3. The mitral valve is grossly normal. Trivial mitral valve  ?regurgitation. No evidence of  mitral stenosis.  ? 4. The aortic valve is grossly normal. Aortic valve regurgitation is not  ?visualized. No aortic stenosis is present.  ? 5. The inferior vena cava is normal in size with <50% respiratory  ?var

## 2021-12-26 LAB — CBC
Hematocrit: 38.9 % (ref 37.5–51.0)
Hemoglobin: 13.6 g/dL (ref 13.0–17.7)
MCH: 33.1 pg — ABNORMAL HIGH (ref 26.6–33.0)
MCHC: 35 g/dL (ref 31.5–35.7)
MCV: 95 fL (ref 79–97)
Platelets: 242 10*3/uL (ref 150–450)
RBC: 4.11 x10E6/uL — ABNORMAL LOW (ref 4.14–5.80)
RDW: 13 % (ref 11.6–15.4)
WBC: 10.5 10*3/uL (ref 3.4–10.8)

## 2021-12-26 LAB — BASIC METABOLIC PANEL
BUN/Creatinine Ratio: 16 (ref 10–24)
BUN: 18 mg/dL (ref 8–27)
CO2: 23 mmol/L (ref 20–29)
Calcium: 8.8 mg/dL (ref 8.6–10.2)
Chloride: 105 mmol/L (ref 96–106)
Creatinine, Ser: 1.15 mg/dL (ref 0.76–1.27)
Glucose: 111 mg/dL — ABNORMAL HIGH (ref 70–99)
Potassium: 3.4 mmol/L — ABNORMAL LOW (ref 3.5–5.2)
Sodium: 144 mmol/L (ref 134–144)
eGFR: 63 mL/min/{1.73_m2} (ref >59)

## 2021-12-26 LAB — TSH: TSH: 0.622 u[IU]/mL (ref 0.450–4.500)

## 2021-12-27 ENCOUNTER — Encounter: Payer: Self-pay | Admitting: Physician Assistant

## 2021-12-30 ENCOUNTER — Other Ambulatory Visit: Payer: Self-pay | Admitting: Nurse Practitioner

## 2021-12-30 NOTE — Addendum Note (Signed)
Addended byEulas Post, Acy Orsak on: 12/30/2021 02:18 PM ? ? Modules accepted: Orders ? ?

## 2021-12-30 NOTE — Progress Notes (Signed)
Stable renal function. Normal hemoglobin. Normal thyroid test. Potassium borderline low. Increase intake of potassium rich food such as broccoli, spinach or banana.

## 2022-01-04 ENCOUNTER — Telehealth: Payer: Self-pay

## 2022-01-04 NOTE — Telephone Encounter (Addendum)
Left a voice message for patient to give me a call back at the office. ? ?----- Message from Azalee Course, [place] sent at 12/30/2021  5:57 PM EDT ----- ?Stable renal function. Normal hemoglobin. Normal thyroid test. Potassium borderline low. Increase intake of potassium rich food such as broccoli, spinach or banana.  ?

## 2022-01-05 NOTE — Telephone Encounter (Signed)
Pt's daughter called back for results.  ?

## 2022-01-05 NOTE — Telephone Encounter (Signed)
The patient's daughter [facility]) has been notified of the result and verbalized understanding.  All questions (if any) were answered. ?Michaelyn Barter, RN 01/05/2022 11:15 AM  ? ?

## 2022-01-08 IMAGING — CT CT HEAD W/O CM
4 series · 15 of 47 positions shown, 17 images · non-contrast
Comparison: 09/15/2017

CLINICAL DATA: Syncope

EXAM:
CT HEAD WITHOUT CONTRAST
TECHNIQUE: Contiguous axial images were obtained from the base of the skull
through the vertex without intravenous contrast.

[Series 3: head wo · axial · 0.46mm/px · z∈[-190,-70]mm · 7 of 32 slices shown, 9 images]
[im 4/32  brain]
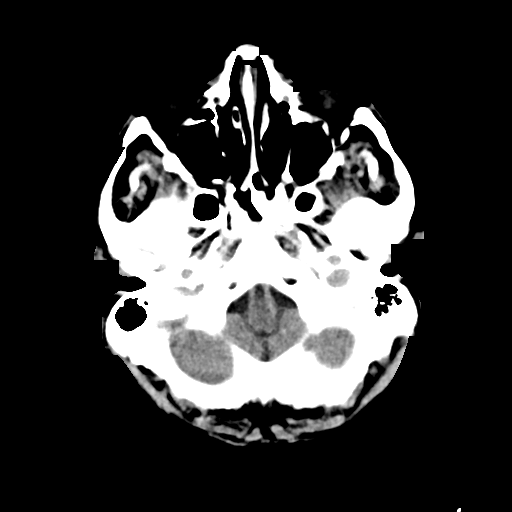
[im 4/32  bone]
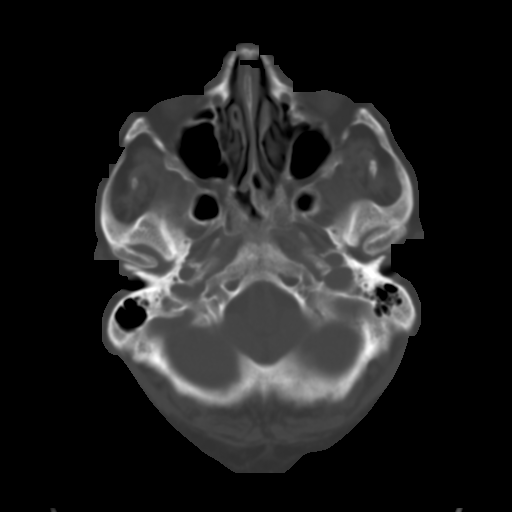
[im 8/32  brain]
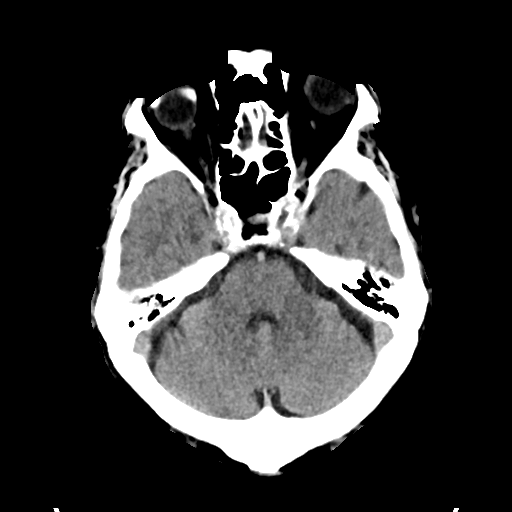
[im 12/32  brain]
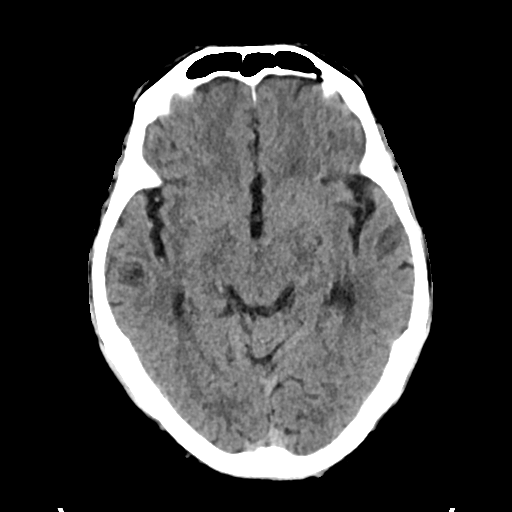
[im 16/32  brain]
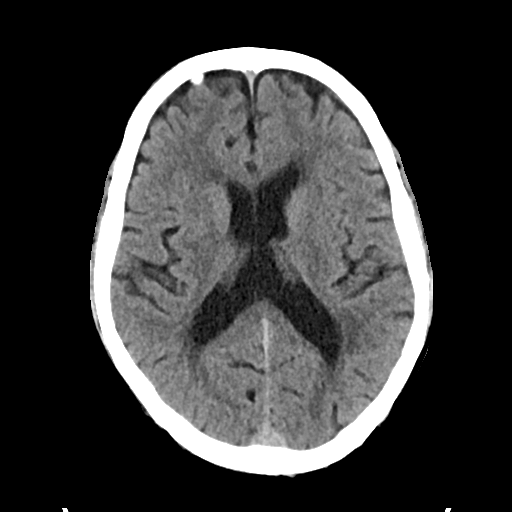
[im 20/32  brain]
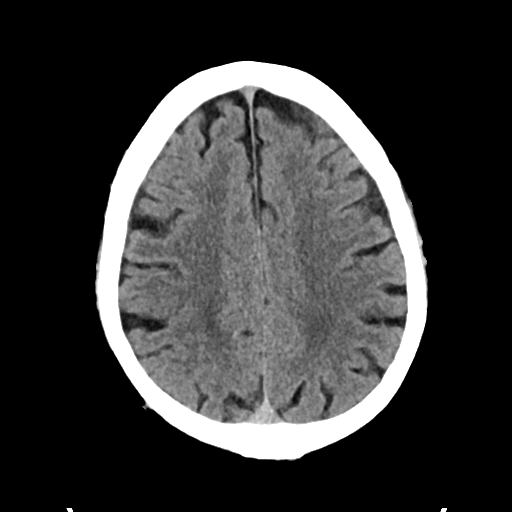
[im 20/32  bone]
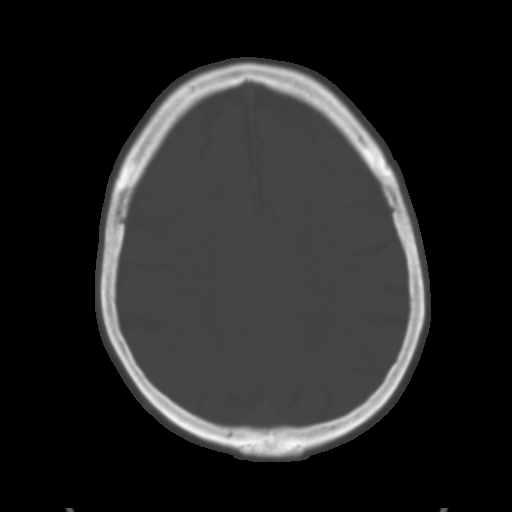
[im 24/32  brain]
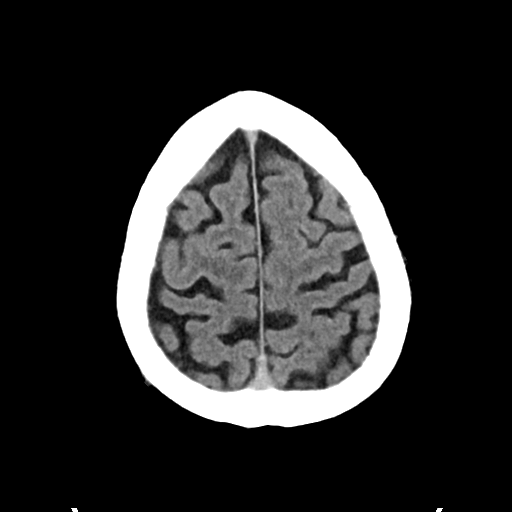
[im 28/32  brain]
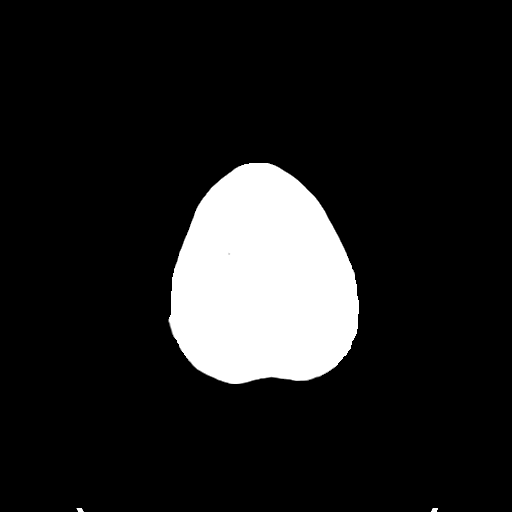

[Series 4: head bone · axial · 0.46mm/px · z∈[-191,-175]mm · 2 of 78 slices shown]
[im 8/78  bone]
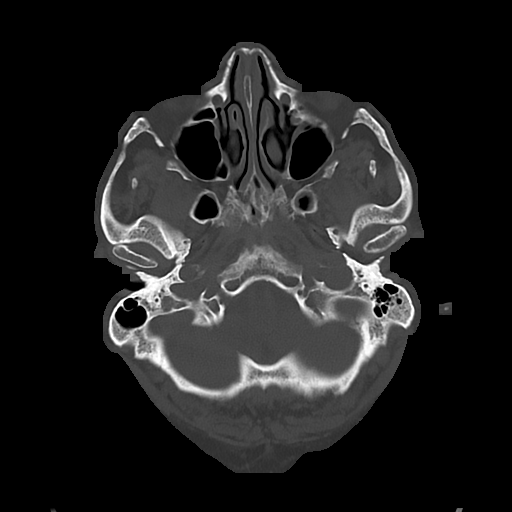
[im 16/78  bone]
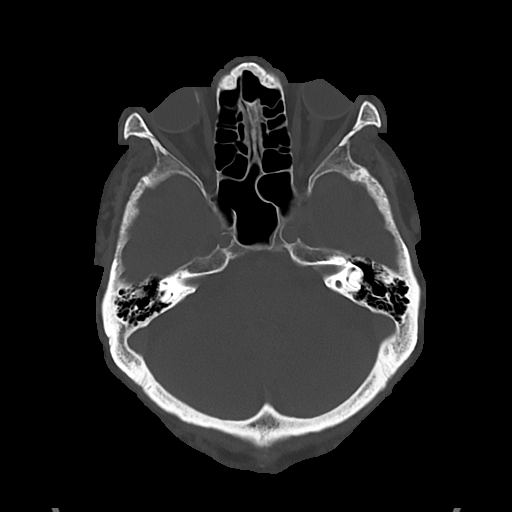

[Series 5: cor soft · coronal · 0.31mm/px · 3 of 64 slices shown]
[im 22/64  brain]
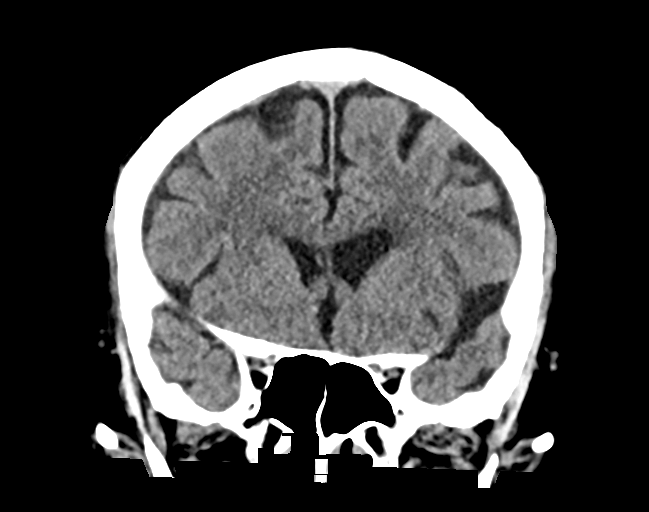
[im 29/64  brain]
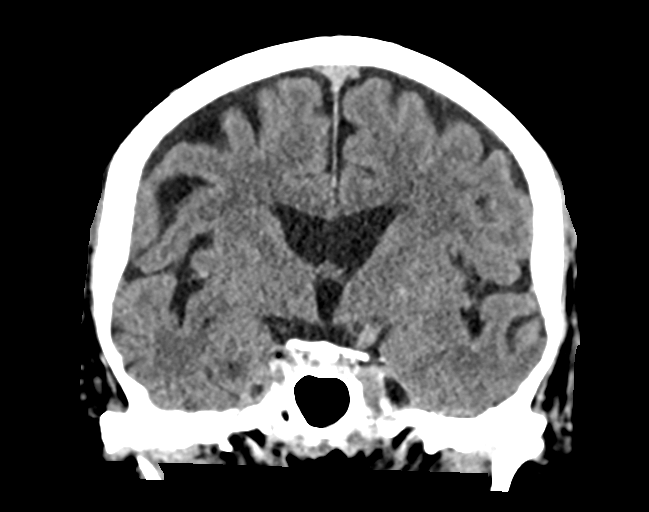
[im 36/64  brain]
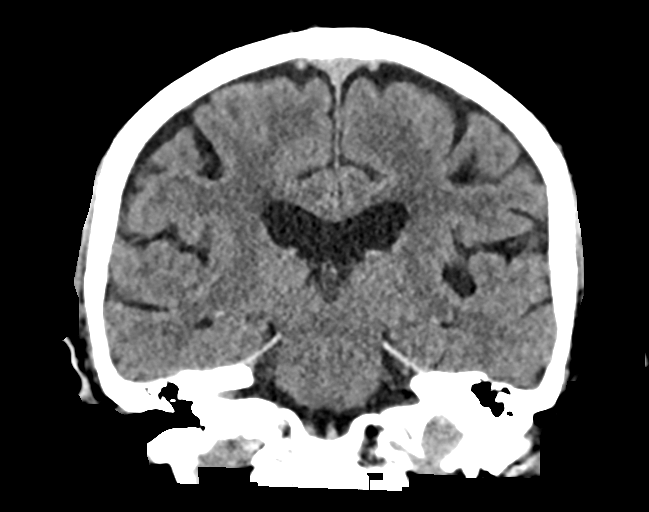

[Series 6: sag soft · sagittal · 0.38mm/px · 3 of 51 slices shown]
[im 17/51  brain]
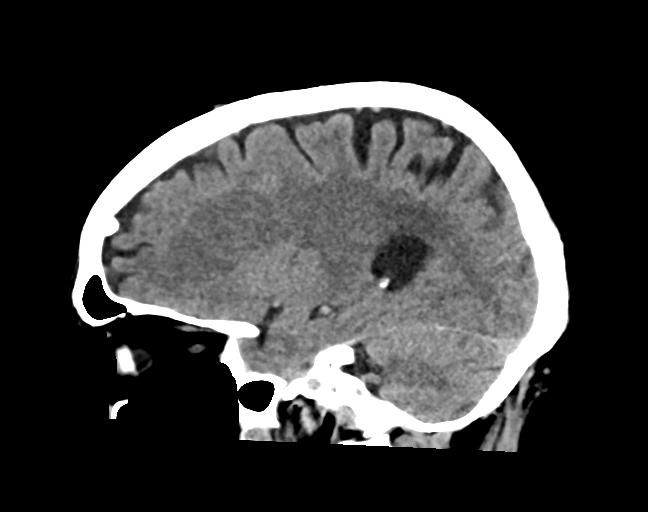
[im 26/51  brain]
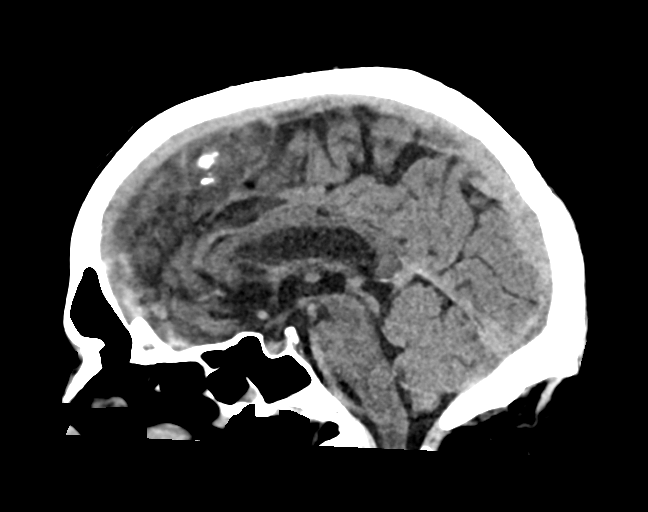
[im 34/51  brain]
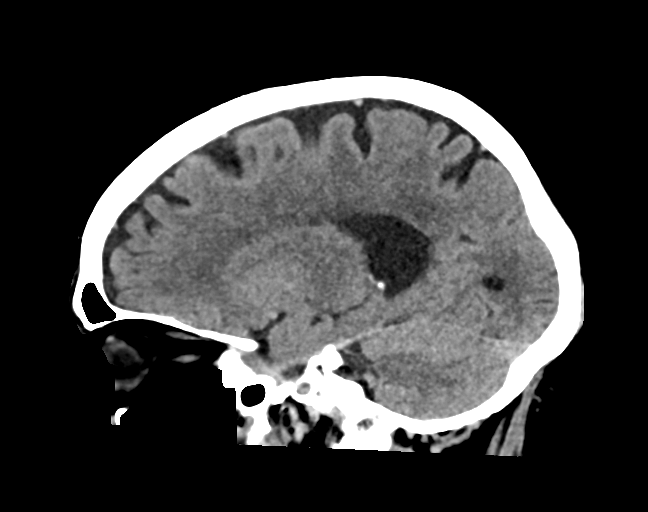

[15 of 47 positions shown; findings below may reference images not displayed]

FINDINGS: Brain: Normal anatomic configuration. Parenchymal volume loss is
commensurate with the patient's age. Moderate periventricular white
matter changes are present, slightly progressive since prior
examination, likely reflecting the sequela of small vessel ischemia.
Remote lacunar infarct noted within the a insular cortices
bilaterally, new since prior examination no abnormal intra or
extra-axial mass lesion or fluid collection. No abnormal mass effect
or midline shift. No evidence of acute intracranial hemorrhage or
infarct. Ventricular size is normal. Cerebellum unremarkable.

Vascular: No asymmetric hyperdense vasculature at the skull base.

Skull: Intact

Sinuses/Orbits: Paranasal sinuses are clear. Right scleral buckle
noted. Orbits are otherwise unremarkable.

Other: Mastoid air cells and middle ear cavities are clear.
IMPRESSION: No acute intracranial abnormality.  No calvarial fracture.

Moderate senescent change, progressive since prior examination,
likely the sequela of small vessel ischemia.

Bilateral insular cortex lacunar infarcts, new since prior
examination, but remote in nature.

## 2022-03-08 ENCOUNTER — Telehealth (HOSPITAL_COMMUNITY): Payer: Self-pay | Admitting: Emergency Medicine

## 2022-03-08 NOTE — Telephone Encounter (Signed)
Attempted to call patient regarding upcoming cardiac PET appointment. Left message on voicemail with name and callback number Gabryel Files RN Navigator Cardiac Imaging [facility] [phone] Office [phone] Cell  

## 2022-03-09 ENCOUNTER — Ambulatory Visit (HOSPITAL_COMMUNITY)
Admission: RE | Admit: 2022-03-09 | Discharge: 2022-03-09 | Disposition: A | Discharge status: Home / Self Care | Payer: Medicare (Managed Care) | Source: Ambulatory Visit | Attending: Physician Assistant | Admitting: Physician Assistant

## 2022-03-09 DIAGNOSIS — R0609 Other forms of dyspnea: Secondary | ICD-10-CM | POA: Diagnosis present

## 2022-03-09 LAB — NM PET CT CARDIAC PERFUSION MULTI W/ABSOLUTE BLOODFLOW
LV dias vol: 105 mL (ref 62–150)
LV sys vol: 34 mL
MBFR: 1.98
Nuc Rest EF: 59 %
Nuc Stress EF: 68 %
Rest MBF: 1 ml/g/min
ST Depression (mm): 0 mm
Stress MBF: 1.98 ml/g/min
TID: 1.04

## 2022-03-09 MED ORDER — RUBIDIUM RB82 GENERATOR (RUBYFILL)
25.4500 | PACK | Freq: Once | INTRAVENOUS | Status: AC
  Administered 2022-03-09: 25.45 via INTRAVENOUS

## 2022-03-09 MED ORDER — RUBIDIUM RB82 GENERATOR (RUBYFILL)
25.7300 | PACK | Freq: Once | INTRAVENOUS | Status: AC
  Administered 2022-03-09: 25.73 via INTRAVENOUS

## 2022-03-09 MED ORDER — REGADENOSON 0.4 MG/5ML IV SOLN
INTRAVENOUS | Status: AC
  Administered 2022-03-09: 0.4 mg via INTRAVENOUS
  Filled 2022-03-09: qty 5

## 2022-03-15 ENCOUNTER — Telehealth: Payer: Self-pay

## 2022-03-15 NOTE — Telephone Encounter (Addendum)
Left voice message for patient to call back for results of recent Cardiac PET stress test.   ----- Message from Azalee Course, [place] sent at 03/12/2022  1:48 PM EDT ----- Low risk stress test, although there are some moderate coronary calcification, there was no sign of significant reversible blockage. Normal pumping function noted. Reassuring study.

## 2022-03-18 ENCOUNTER — Ambulatory Visit (INDEPENDENT_AMBULATORY_CARE_PROVIDER_SITE_OTHER): Payer: Medicare (Managed Care) | Admitting: Internal Medicine

## 2022-03-18 ENCOUNTER — Encounter: Payer: Self-pay | Admitting: Internal Medicine

## 2022-03-18 VITALS — BP 135/71 | HR 64 | Ht 67.0 in | Wt 230.0 lb

## 2022-03-18 DIAGNOSIS — I2510 Atherosclerotic heart disease of native coronary artery without angina pectoris: Secondary | ICD-10-CM | POA: Diagnosis not present

## 2022-03-18 DIAGNOSIS — E785 Hyperlipidemia, unspecified: Secondary | ICD-10-CM | POA: Diagnosis not present

## 2022-03-18 DIAGNOSIS — I10 Essential (primary) hypertension: Secondary | ICD-10-CM | POA: Diagnosis not present

## 2022-03-18 DIAGNOSIS — R0609 Other forms of dyspnea: Secondary | ICD-10-CM

## 2022-03-18 NOTE — Progress Notes (Signed)
OFFICE NOTE  Chief Complaint:  Follow-up stress test  Primary Care Physician: Pcp, No  HPI:  Kenneth Brandt is a 87 y.o. male with a past medial history significant for hypertension, dyslipidemia, hypothyroidism and some mild dementia.  He was admitted in August 2022 after an unwitnessed fall.  He was noted to be mildly hypoxic and hypotensive.  He had a prolonged QTc.  Echo showed normal LVEF 65 to 70%.  He was noted to have coronary artery calcification on his chest CT.  Recently he saw Azalee Course, PA-C, who ordered a cardiac PET for work-up of fatigue in the setting of multivessel coronary calcium.  The PET scan performed on 03/09/2022 was normal and considered low risk.  LVEF 59% at rest and 68% with stress.  No TID.  Global myocardial blood flow reserve was normal at 1.98.  He says he is feeling well.  He had had some lower blood pressures but more recently the blood pressure has improved.  This was assessed to be normal today 135/71.  He is not on blood pressure medication.  His last LDL cholesterol last summer was well controlled at 75 on atorvastatin.  PMHx:  Past Medical History:  Diagnosis Date   Anxiety    Arthritis    Chronic back pain    Dementia (HCC)    Depression    Dyspnea    occasionally   Essential tremor    GERD (gastroesophageal reflux disease)    Hepatitis    A -treated   HSV infection    Hypercholesteremia    Hypertension    Hypothyroid    Neuromuscular disorder (HCC)    PAD (peripheral artery disease) (HCC)    Seasonal allergies    Wears glasses    Wears partial dentures    upper and lower    Past Surgical History:  Procedure Laterality Date   ANKLE FRACTURE SURGERY Right    APPENDECTOMY     remote   BACK SURGERY  2009, 2011   laminectomies   BIOPSY  11/21/2019   Procedure: BIOPSY;  Surgeon: Graylin Shiver, MD;  [facility]: [facility];  Service: Endoscopy;;   CARDIOVASCULAR STRESS TEST     04/25/15 Nuclear stress test [facility]): No  definite evidence for inducible ischemia or infarct, EF 74%   CATARACT EXTRACTION Bilateral    CHOLECYSTECTOMY N/A 02/14/2020   Procedure: LAPAROSCOPIC CHOLECYSTECTOMY;  Surgeon: Harriette Bouillon, MD;  Location: [facility];  Service: General;  Laterality: N/A;   ESOPHAGOGASTRODUODENOSCOPY (EGD) WITH PROPOFOL N/A 11/21/2019   Procedure: ESOPHAGOGASTRODUODENOSCOPY (EGD) WITH PROPOFOL;  Surgeon: Graylin Shiver, MD;  [facility];  Service: Endoscopy;  Laterality: N/A;   IR KYPHO LUMBAR INC FX REDUCE BONE BX UNI/BIL CANNULATION INC/IMAGING  03/14/2018   IR SACROPLASTY BILATERAL  08/18/2018   REPLACEMENT TOTAL KNEE Right 2012   RETINAL DETACHMENT SURGERY Right    TONSILLECTOMY     TOTAL KNEE ARTHROPLASTY Left 02/22/2017   TOTAL KNEE ARTHROPLASTY Left 02/22/2017   Procedure: LEFT TOTAL KNEE ARTHROPLASTY;  Surgeon: Sheral Apley, MD;  Location: [facility];  Service: Orthopedics;  Laterality: Left;    FAMHx:  Family History  Problem Relation Age of Onset   Cancer Mother    Pneumonia Mother    CAD Father    CAD Brother    Cancer Other    Stroke Neg Hx     SOCHx:   reports that he has never smoked. He has never used smokeless tobacco. He reports that he  does not drink alcohol and does not use drugs.  ALLERGIES:  Allergies  Allergen Reactions   Lactose Intolerance (Gi) Other (See Comments)    Gi upset    ROS: Pertinent items noted in HPI and remainder of comprehensive ROS otherwise negative.  HOME MEDS: Current Outpatient Medications on File Prior to Visit  Medication Sig Dispense Refill   aspirin EC 81 MG tablet Take 81 mg by mouth daily. Swallow whole.     atorvastatin (LIPITOR) 20 MG tablet Take 20 mg by mouth daily.     calcium-vitamin D (OSCAL WITH D) 500-200 MG-UNIT TABS tablet Take 1 tablet by mouth in the morning and at bedtime.     DULoxetine (CYMBALTA) 60 MG capsule Take 60 mg by mouth daily.      fexofenadine (ALLEGRA) 180 MG tablet Take 180 mg by mouth daily.      guaiFENesin-dextromethorphan (ROBITUSSIN DM) 100-10 MG/5ML syrup Take 5 mLs by mouth every 4 (four) hours as needed for cough. 118 mL 1   ibandronate (BONIVA) 150 MG tablet Take 150 mg by mouth every 30 (thirty) days. Take in the morning with a full glass of water, on an empty stomach, and do not take anything else by mouth or lie down for the next 30 min.     levothyroxine (SYNTHROID, LEVOTHROID) 75 MCG tablet Take 75 mcg by mouth daily before breakfast.     LORazepam (ATIVAN) 0.5 MG tablet Take 0.5 mg by mouth at bedtime. sleep     morphine (MSIR) 15 MG tablet Take 15 mg by mouth every 4 (four) hours as needed for severe pain.     pantoprazole (PROTONIX) 40 MG tablet Take 40 mg by mouth daily.     polyethylene glycol (MIRALAX / GLYCOLAX) 17 g packet Take 17 g by mouth daily as needed for mild constipation.     pregabalin (LYRICA) 75 MG capsule Take 75 mg by mouth 2 (two) times daily.      torsemide (DEMADEX) 20 MG tablet Take 2 tablets (40 mg total) by mouth daily.     traMADol (ULTRAM) 50 MG tablet Take by mouth every 6 (six) hours as needed.     No current facility-administered medications on file prior to visit.    LABS/IMAGING: No results found for this or any previous visit (from the past 48 hour(s)). No results found.  LIPID PANEL:    Component Value Date/Time   CHOL 119 04/24/2021 0616   TRIG 79 04/24/2021 0616   HDL 28 (L) 04/24/2021 0616   CHOLHDL 4.3 04/24/2021 0616   VLDL 16 04/24/2021 0616   LDLCALC 75 04/24/2021 0616     WEIGHTS: Wt Readings from Last 3 Encounters:  03/18/22 230 lb (104.3 kg)  12/25/21 217 lb (98.4 kg)  07/01/21 210 lb 12.8 oz (95.6 kg)    VITALS: BP 135/71   Pulse 64   Ht 5\' 7"  (1.702 m)   Wt 230 lb (104.3 kg)   SpO2 96%   BMI 36.02 kg/m   EXAM: General appearance: alert, appears stated age, and no distress Neck: no carotid bruit, no JVD, and thyroid not enlarged, symmetric, no tenderness/mass/nodules Lungs: clear to auscultation  bilaterally Heart: regular rate and rhythm Abdomen: soft, non-tender; bowel sounds normal; no masses,  no organomegaly Extremities: extremities normal, atraumatic, no cyanosis or edema Pulses: 2+ and symmetric Skin: Skin color, texture, turgor normal. No rashes or lesions Neurologic: Grossly normal Psych: Pleasant  EKG: Sinus rhythm with first-degree AV block at 64- personally reviewed  ASSESSMENT: Dyspnea on exertion-negative PET for ischemia (02/2022) Multivessel coronary calcium by CT Labile hypertension-off medication Dyslipidemia  PLAN: 1.   Overall Mr. Ozzie Hoyle is doing much better.  His blood pressures rebounded.  He is not currently on medication.  He had a PET scan this month which was negative for ischemia and showed no evidence of significant multivessel disease.  He does have coronary calcification and will need to continue to have aggressive medical therapy including targeting LDL less than 70.  His blood pressure is well controlled off medication but may be labile.  This should be monitored by his PCP.  Plan follow-up with [place] annually or sooner as necessary  Chrystie Nose, MD, [facility], FACP  [place]  [facility]  Medical Director of the [facility] Lipid [facility] &  Cardiovascular Risk Reduction Clinic Diplomate of the American Board of Clinical Lipidology Attending Cardiologist  Direct Dial: [phone]  Fax: [phone]  Website:  [email or web address]   Lisette Abu Malcome Ambrocio 03/18/2022, 9:22 AM

## 2022-03-18 NOTE — Patient Instructions (Signed)
Follow-Up: At [facility], you and your health needs are our priority.  As part of our continuing mission to provide you with exceptional heart care, we have created designated Provider Care Teams.  These Care Teams include your primary Cardiologist (physician) and Advanced Practice Providers (APPs -  Physician Assistants and Nurse Practitioners) who all work together to provide you with the care you need, when you need it.  We recommend signing up for the patient portal called "MyChart".  Sign up information is provided on this After Visit Summary.  MyChart is used to connect with patients for Virtual Visits (Telemedicine).  Patients are able to view lab/test results, encounter notes, upcoming appointments, etc.  Non-urgent messages can be sent to your provider as well.   To learn more about what you can do with MyChart, go to [email or web address].    Your next appointment:   12 month(s)  The format for your next appointment:   In Person  Provider:   Chrystie Nose, MD {  Important Information About Sugar

## 2022-09-08 IMAGING — DX DG CHEST 2V
2 series · 2 of 2 positions shown · non-contrast
Comparison: Chest radiograph 04/23/2021.

CLINICAL DATA: Shortness of breath.

EXAM:
CHEST - 2 VIEW

[dg chest 2 view (1 of 2)]
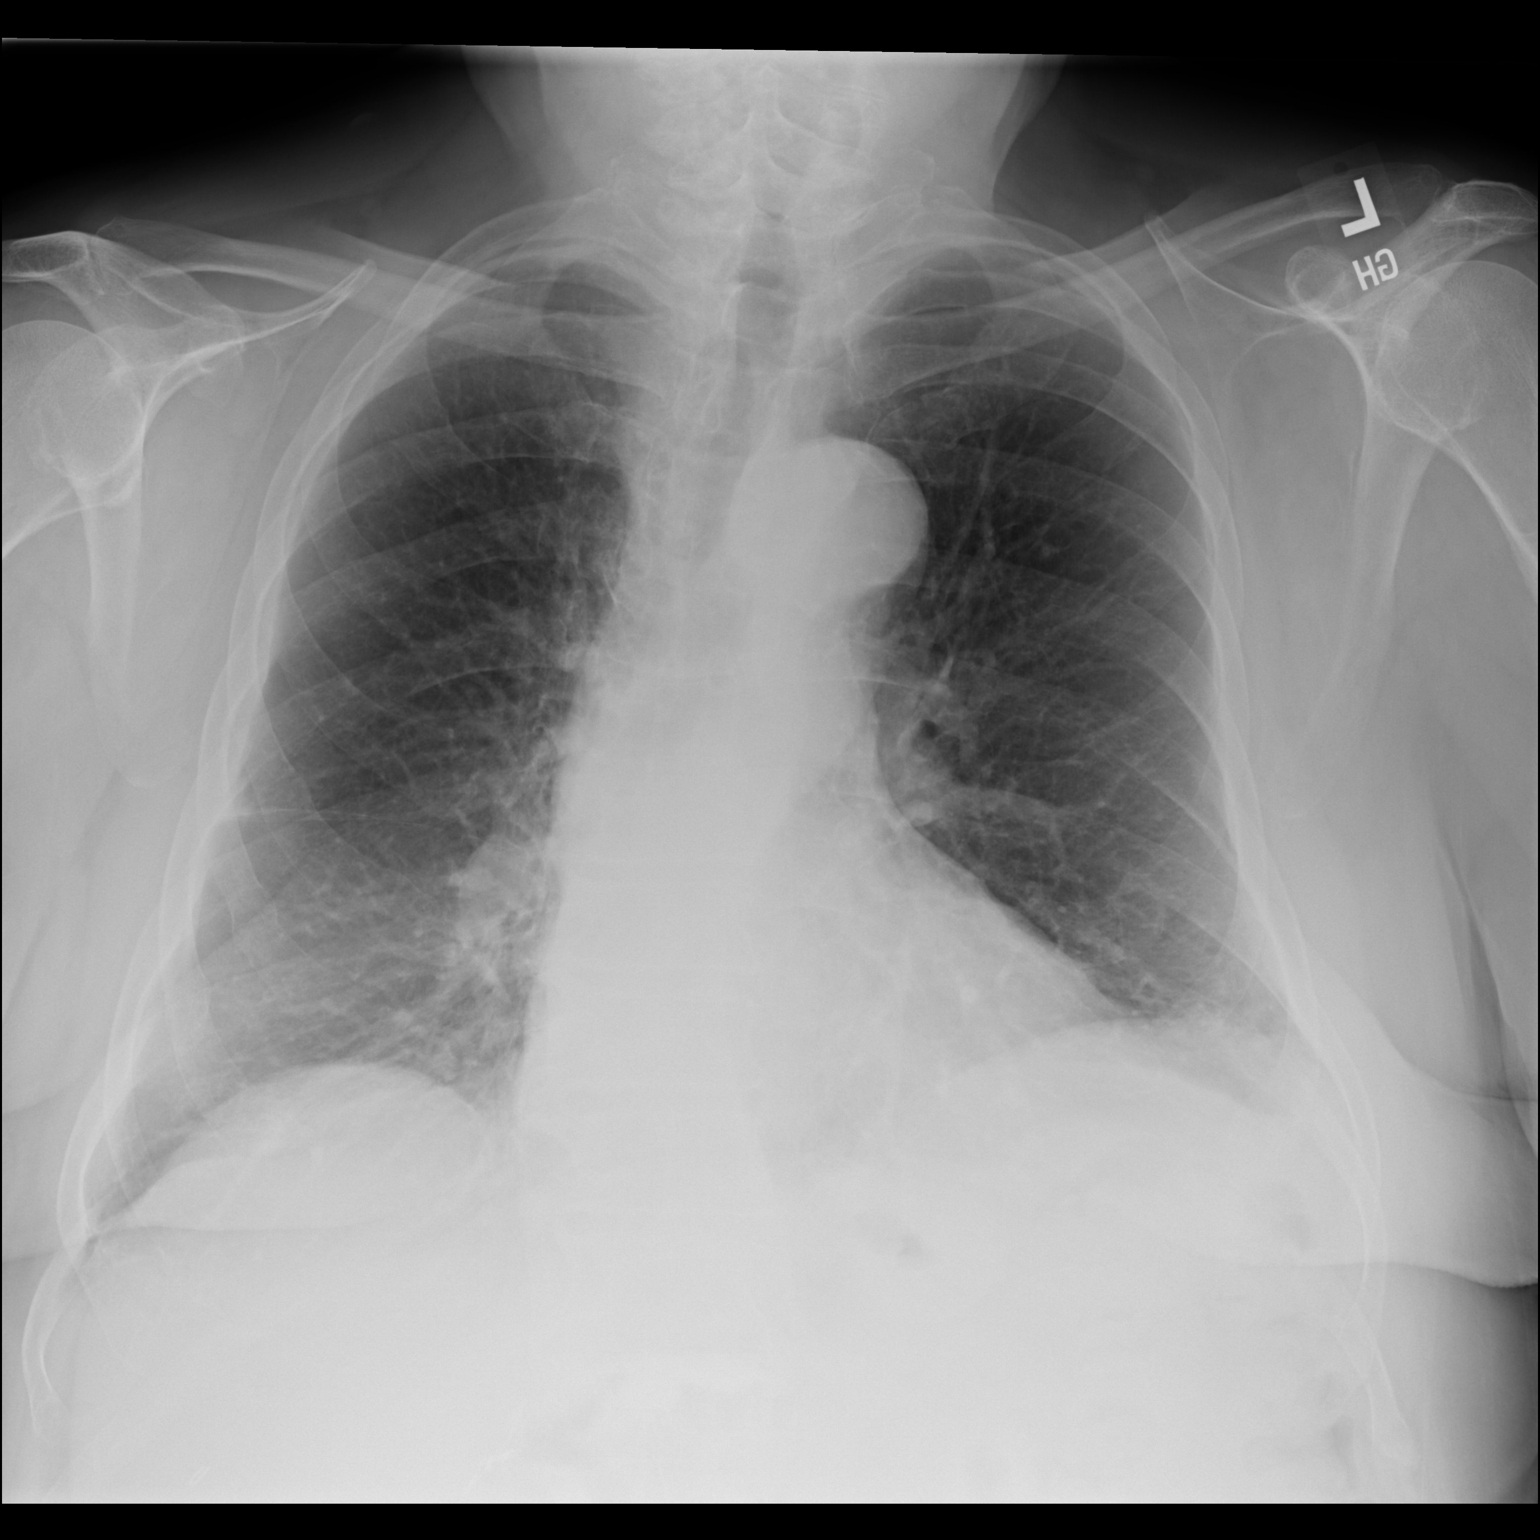

[dg chest 2 view (2 of 2)]
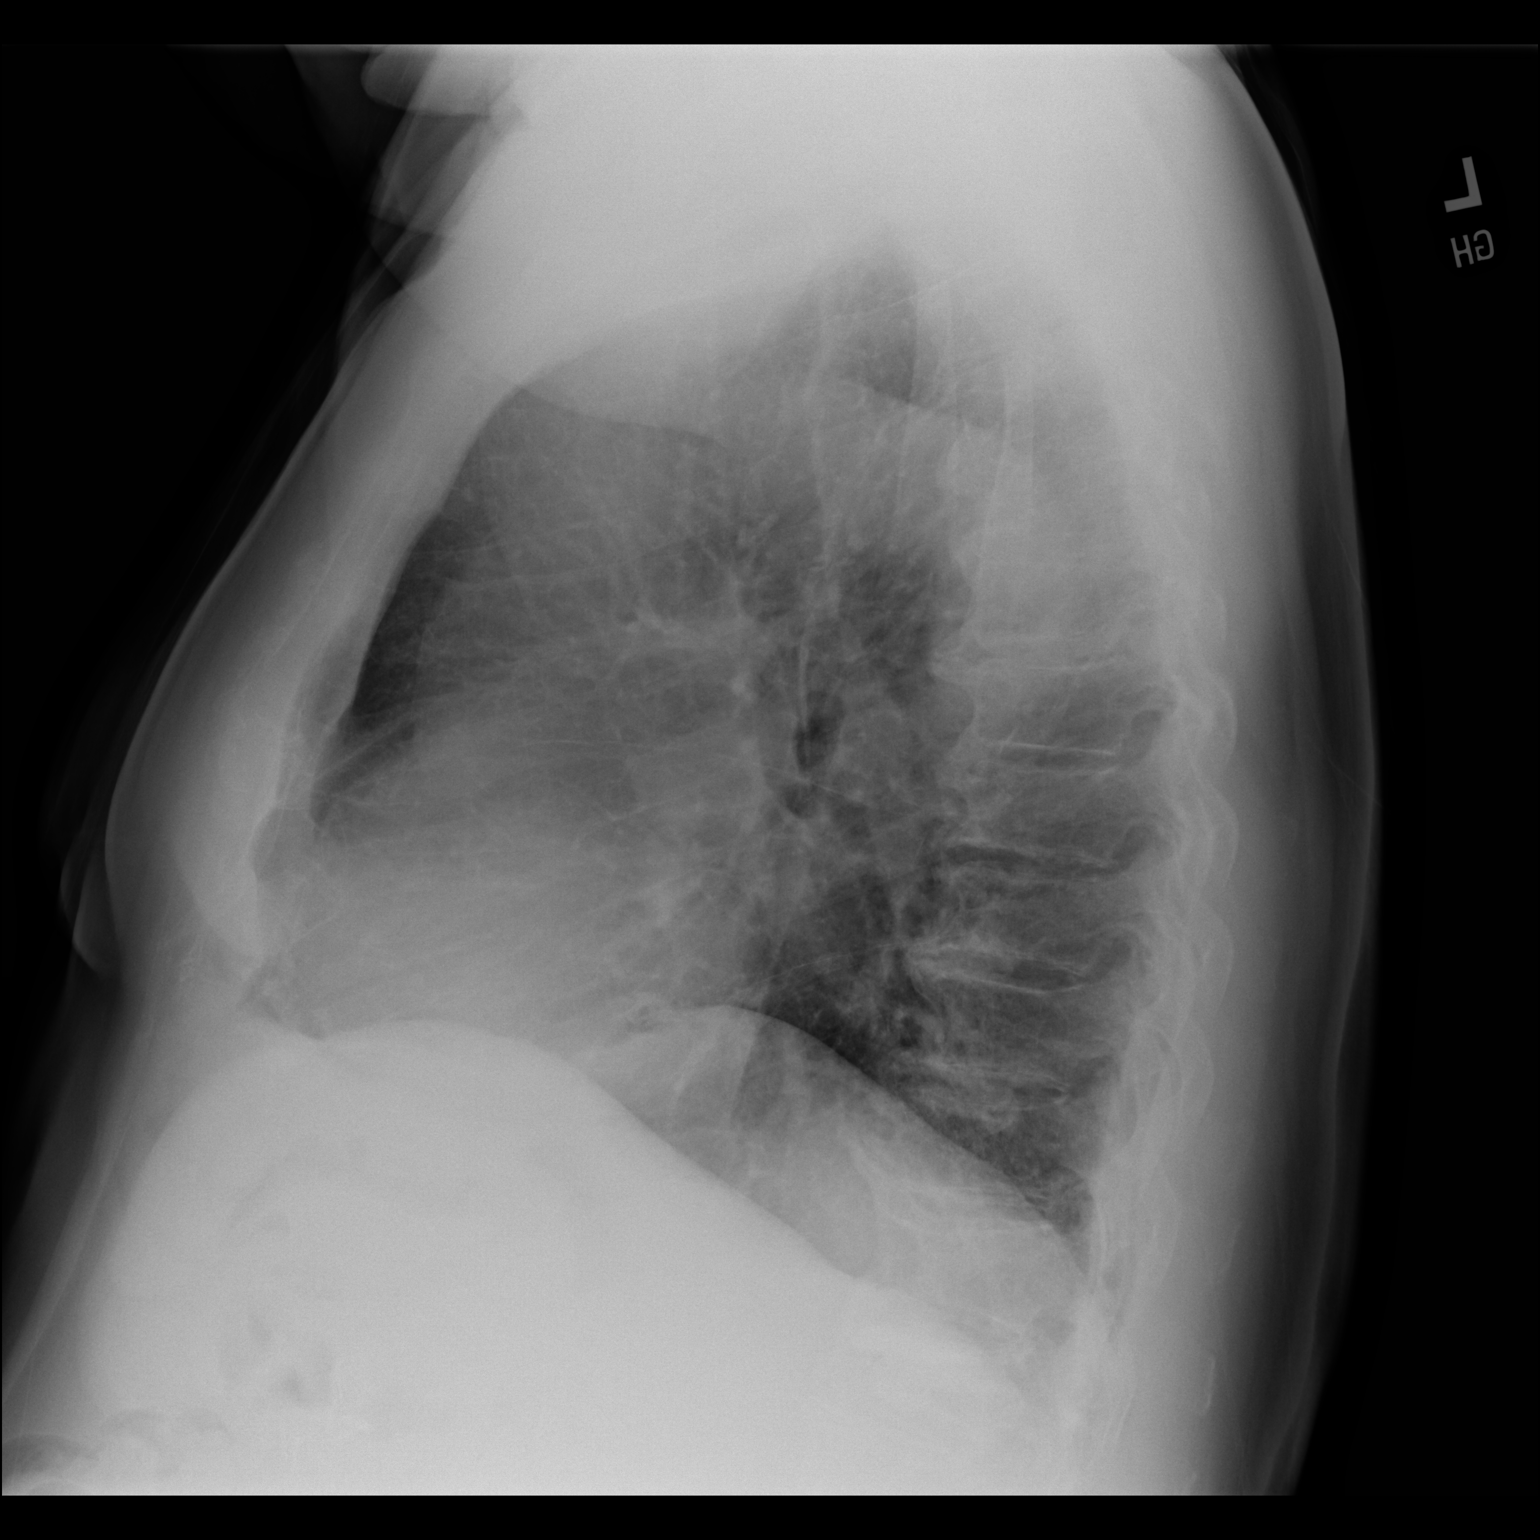

[2 of 2 positions shown; findings below may reference images not displayed]

FINDINGS: Stable cardiac and mediastinal contours. Bibasilar atelectasis. No
large area pulmonary consolidation. No pleural effusion or
pneumothorax. Thoracic spine degenerative changes.
IMPRESSION: No active cardiopulmonary disease.

## 2022-11-15 ENCOUNTER — Other Ambulatory Visit: Payer: Self-pay | Admitting: Internal Medicine

## 2022-11-15 DIAGNOSIS — M810 Age-related osteoporosis without current pathological fracture: Secondary | ICD-10-CM

## 2023-02-23 ENCOUNTER — Telehealth: Payer: Self-pay | Admitting: Internal Medicine

## 2023-02-23 NOTE — Telephone Encounter (Signed)
Incoming call from the patient and [facility] to make an appointment for the patient regarding recent referral. Soonest appt I had available was 9/9 with Gunnar Fusi but patient is wanting recommendations on what he can do to help him until he is able to be seen. Shanda Bumps from [facility] is requesting a fax of recommendations as well. [phone] Please advise

## 2023-02-23 NOTE — Telephone Encounter (Signed)
Was unable to contact patient, LVM to inform patient of appt and to let him know to call back if he is unable to make it.

## 2023-02-23 NOTE — Telephone Encounter (Signed)
Pt scheduled to see Dr. Marina Goodell 03/08/23 at 9am. Please contact pt with appt.

## 2023-03-08 ENCOUNTER — Encounter: Payer: Self-pay | Admitting: Internal Medicine

## 2023-03-08 ENCOUNTER — Ambulatory Visit (INDEPENDENT_AMBULATORY_CARE_PROVIDER_SITE_OTHER): Payer: Medicare (Managed Care) | Admitting: Internal Medicine

## 2023-03-08 VITALS — BP 138/78 | HR 63 | Ht 67.0 in | Wt 238.2 lb

## 2023-03-08 DIAGNOSIS — R1013 Epigastric pain: Secondary | ICD-10-CM | POA: Diagnosis not present

## 2023-03-08 DIAGNOSIS — R194 Change in bowel habit: Secondary | ICD-10-CM | POA: Diagnosis not present

## 2023-03-08 DIAGNOSIS — G8929 Other chronic pain: Secondary | ICD-10-CM | POA: Diagnosis not present

## 2023-03-08 NOTE — Progress Notes (Signed)
HISTORY OF PRESENT ILLNESS:  Kenneth Brandt is a 88 y.o. male , retired Education officer, environmental, who presents himself to this practice today regarding chronic abdominal pain and chronic alternating bowel habits.  The patient was initially seen new to this practice September 11, 2020, sent by his primary care provider at [facility] regarding chronic problems with intermittent dysphagia, constipation, and nausea with vomiting. He was accompanied by his daughter. He reports several year history of intermittent problems with dysphagia to liquids and solids. He did undergo evaluation in the form of barium esophagram which revealed dysmotility and decreased distensibility of the distal esophagus which did not allow passage of a barium tablet. Thus, he subsequently underwent upper endoscopy with Dr. Evette Cristal of [facility] November 21, 2019. Examination was entirely normal. He also reported episodic problems with nausea and vomiting. Often dry heaves. May occur once every 1 to 2 months. 6 or 8 episodes per year. Last a day or 2. Has been given antiemetics in the past with variable success. These have included Phenergan and Zofran. In between episodes, he seems to do well. He does have chronic constipation for which he takes MiraLAX. Of importance, at that time, he has been on chronic narcotics in the form of sustained-release morphine as well as as needed morphine for chronic back pain. Patient was diagnosed with pneumonia in December. This was noted on CT scan of the abdomen and chest.   He is status post cholecystectomy.  He tells me that his bowel habits can alternate between constipated and loose.  He continues with chronic vague abdominal pain which he states he has had on a daily basis.  He cannot identify any factors which either exacerbate or relieve the discomfort.  No bleeding.  No weight loss.  Tells me that his wife states that he has had problems with irregular bowel habits and chronic abdominal pain since they were  married decades ago.  No bleeding or weight loss.  She does report that he did stop morphine.  Since then no further issues with vomiting.  He does use tramadol for pain.  Previous gastric emptying scan February 2022 was normal.  Barium enema for similar complaints to today was performed March 2021.  This was negative  REVIEW OF SYSTEMS:  All non-GI ROS negative as otherwise stated in the HPI except for arthritis, back pain, muscle cramps, ankle swelling  Past Medical History:  Diagnosis Date   Anxiety    Arthritis    Chronic back pain    Dementia (HCC)    Depression    Dyspnea    occasionally   Essential tremor    GERD (gastroesophageal reflux disease)    Hepatitis    A -treated   HSV infection    Hypercholesteremia    Hypertension    Hypothyroid    Neuromuscular disorder (HCC)    PAD (peripheral artery disease) (HCC)    Seasonal allergies    Wears glasses    Wears partial dentures    upper and lower    Past Surgical History:  Procedure Laterality Date   ANKLE FRACTURE SURGERY Right    APPENDECTOMY     remote   BACK SURGERY  2009, 2011   laminectomies   BIOPSY  11/21/2019   Procedure: BIOPSY;  Surgeon: Graylin Shiver, MD;  Location: [facility];  Service: Endoscopy;;   CARDIOVASCULAR STRESS TEST     04/25/15 Nuclear stress test [facility]): No definite evidence for inducible ischemia or infarct, EF 74%  CATARACT EXTRACTION Bilateral    CHOLECYSTECTOMY N/A 02/14/2020   Procedure: LAPAROSCOPIC CHOLECYSTECTOMY;  Surgeon: Harriette Bouillon, MD;  Location: [facility];  Service: General;  Laterality: N/A;   ESOPHAGOGASTRODUODENOSCOPY (EGD) WITH PROPOFOL N/A 11/21/2019   Procedure: ESOPHAGOGASTRODUODENOSCOPY (EGD) WITH PROPOFOL;  Surgeon: Graylin Shiver, MD;  [facility]: [facility];  Service: Endoscopy;  Laterality: N/A;   IR KYPHO LUMBAR INC FX REDUCE BONE BX UNI/BIL CANNULATION INC/IMAGING  03/14/2018   IR SACROPLASTY BILATERAL  08/18/2018   REPLACEMENT TOTAL KNEE Right 2012    RETINAL DETACHMENT SURGERY Right    TONSILLECTOMY     TOTAL KNEE ARTHROPLASTY Left 02/22/2017   TOTAL KNEE ARTHROPLASTY Left 02/22/2017   Procedure: LEFT TOTAL KNEE ARTHROPLASTY;  Surgeon: Sheral Apley, MD;  Location: [facility];  Service: Orthopedics;  Laterality: Left;    Social History YAFET CLINE  reports that he has never smoked. He has never used smokeless tobacco. He reports that he does not drink alcohol and does not use drugs.  family history includes CAD in his brother and father; Cancer in his mother and another family member; Pneumonia in his mother.  Allergies  Allergen Reactions   Lactose Intolerance (Gi) Other (See Comments)    Gi upset       PHYSICAL EXAMINATION: Vital signs: BP 138/78 (BP Location: Left Arm, Patient Position: Sitting, Cuff Size: Normal)   Pulse 63   Ht 5\' 7"  (1.702 m)   Wt 238 lb 4 oz (108.1 kg)   SpO2 97%   BMI 37.32 kg/m   Constitutional: Pleasant, generally well-appearing, no acute distress Psychiatric: alert and oriented x3, cooperative Eyes: extraocular movements intact, anicteric, conjunctiva pink Mouth: oral pharynx moist, no lesions Neck: supple no lymphadenopathy Cardiovascular: heart regular rate and rhythm, no murmur Lungs: clear to auscultation bilaterally Abdomen: soft, obese, nontender, nondistended, no obvious ascites, no peritoneal signs, normal bowel sounds, no organomegaly Rectal: Omitted Extremities: no clubbing or cyanosis.  1+ lower extremity edema bilaterally Skin: no lesions on visible extremities Neuro: No focal deficits.  Cranial nerves intact  ASSESSMENT:  1.  Chronic irregular bowel habits without alarm features. 2.  Chronic functional abdominal pain.  Longstanding.  Negative extensive workup previously.  No alarm features 3.  General medical problems 4.  Status post cholecystectomy   PLAN:  1.  Recommend Citrucel 2 tablespoons daily in 14 ounces water or juice.  This is an effort to regulate his  bowel habits. 2.  Reassurance regarding abdominal pain 3.  Resume general medical care with PCP A total time of 30 minutes was spent prior to the patient, obtaining comprehensive interval history, performing medically appropriate physical exam, counseling and educating the patient regarding above listed issues, directing medical therapies, and documenting clinical information in the health record

## 2023-03-08 NOTE — Patient Instructions (Signed)
Take 2 tablespoons of Citrucel in 14 ounces of water or juice daily.  _______________________________________________________  If your blood pressure at your visit was 140/90 or greater, please contact your primary care physician to follow up on this.  _______________________________________________________  If you are age 86 or older, your body mass index should be between 23-30. Your Body mass index is 37.32 kg/m. If this is out of the aforementioned range listed, please consider follow up with your Primary Care Provider.  If you are age 55 or younger, your body mass index should be between 19-25. Your Body mass index is 37.32 kg/m. If this is out of the aformentioned range listed, please consider follow up with your Primary Care Provider.   ________________________________________________________  The [facility] providers would like to encourage you to use [facility] to communicate with providers for non-urgent requests or questions.  Due to long hold times on the telephone, sending your provider a message by [facility] may be a faster and more efficient way to get a response.  Please allow 48 business hours for a response.  Please remember that this is for non-urgent requests.  _______________________________________________________

## 2023-03-18 ENCOUNTER — Ambulatory Visit: Payer: Medicare (Managed Care) | Admitting: Internal Medicine

## 2023-05-09 ENCOUNTER — Ambulatory Visit: Payer: Medicare (Managed Care) | Admitting: Nurse Practitioner

## 2023-05-16 ENCOUNTER — Other Ambulatory Visit: Payer: Self-pay | Admitting: Internal Medicine

## 2023-05-16 DIAGNOSIS — M810 Age-related osteoporosis without current pathological fracture: Secondary | ICD-10-CM

## 2023-05-19 ENCOUNTER — Other Ambulatory Visit: Payer: Medicare (Managed Care)

## 2023-08-15 ENCOUNTER — Ambulatory Visit: Payer: Medicare (Managed Care) | Attending: Internal Medicine | Admitting: Internal Medicine

## 2023-08-15 VITALS — BP 120/64 | HR 64 | Ht 67.0 in | Wt 249.0 lb

## 2023-08-15 DIAGNOSIS — I10 Essential (primary) hypertension: Secondary | ICD-10-CM

## 2023-08-15 DIAGNOSIS — I2510 Atherosclerotic heart disease of native coronary artery without angina pectoris: Secondary | ICD-10-CM | POA: Diagnosis not present

## 2023-08-15 DIAGNOSIS — E785 Hyperlipidemia, unspecified: Secondary | ICD-10-CM

## 2023-08-15 MED ORDER — ATORVASTATIN CALCIUM 40 MG PO TABS: 40.0000 mg | ORAL_TABLET | Freq: Every day | ORAL | 3 refills | Status: AC

## 2023-08-15 NOTE — Progress Notes (Signed)
OFFICE NOTE  Chief Complaint:  Follow-up  Primary Care Physician: Pcp, No  HPI:  Kenneth Brandt is a 88 y.o. male with a past medial history significant for hypertension, dyslipidemia, hypothyroidism and some mild dementia.  He was admitted in August 2022 after an unwitnessed fall.  He was noted to be mildly hypoxic and hypotensive.  He had a prolonged QTc.  Echo showed normal LVEF 65 to 70%.  He was noted to have coronary artery calcification on his chest CT.  Recently he saw Azalee Course, PA-C, who ordered a cardiac PET for work-up of fatigue in the setting of multivessel coronary calcium.  The PET scan performed on 03/09/2022 was normal and considered low risk.  LVEF 59% at rest and 68% with stress.  No TID.  Global myocardial blood flow reserve was normal at 1.98.  He says he is feeling well.  He had had some lower blood pressures but more recently the blood pressure has improved.  This was assessed to be normal today 135/71.  He is not on blood pressure medication.  His last LDL cholesterol last summer was well controlled at 75 on atorvastatin.  08/15/2023  Kenneth Brandt returns today for follow-up.  He seems to be doing well.  Denies any chest pain or shortness of breath.  [facility]  He had labs fairly recently which showed total cholesterol 140, triglycerides 121, HDL 41 and LDL 77.  He is on 20 mg atorvastatin.  He was found to have extensive coronary calcium but no ischemia based on a cardiac PET back in July 2023.  His target LDL should be less than 70.  Looking back it seems like he has been persistently in the upper 70s but has not been quite at target.  Blood pressure is good today 120/64.  He denies any anginal symptoms.  PMHx:  Past Medical History:  Diagnosis Date   Anxiety    Arthritis    Chronic back pain    Dementia (HCC)    Depression    Dyspnea    occasionally   Essential tremor    GERD (gastroesophageal reflux disease)    Hepatitis    A  -treated   HSV infection    Hypercholesteremia    Hypertension    Hypothyroid    Neuromuscular disorder (HCC)    PAD (peripheral artery disease) (HCC)    Seasonal allergies    Wears glasses    Wears partial dentures    upper and lower    Past Surgical History:  Procedure Laterality Date   ANKLE FRACTURE SURGERY Right    APPENDECTOMY     remote   BACK SURGERY  2009, 2011   laminectomies   BIOPSY  11/21/2019   Procedure: BIOPSY;  Surgeon: Graylin Shiver, MD;  [facility];  Service: Endoscopy;;   CARDIOVASCULAR STRESS TEST     04/25/15 Nuclear stress test [facility]): No definite evidence for inducible ischemia or infarct, EF 74%   CATARACT EXTRACTION Bilateral    CHOLECYSTECTOMY N/A 02/14/2020   Procedure: LAPAROSCOPIC CHOLECYSTECTOMY;  Surgeon: Harriette Bouillon, MD;  [facility];  Service: General;  Laterality: N/A;   ESOPHAGOGASTRODUODENOSCOPY (EGD) WITH PROPOFOL N/A 11/21/2019   Procedure: ESOPHAGOGASTRODUODENOSCOPY (EGD) WITH PROPOFOL;  Surgeon: Graylin Shiver, MD;  [facility];  Service: Endoscopy;  Laterality: N/A;   IR KYPHO LUMBAR INC FX REDUCE BONE BX UNI/BIL CANNULATION INC/IMAGING  03/14/2018   IR SACROPLASTY BILATERAL  08/18/2018  REPLACEMENT TOTAL KNEE Right 2012   RETINAL DETACHMENT SURGERY Right    TONSILLECTOMY     TOTAL KNEE ARTHROPLASTY Left 02/22/2017   TOTAL KNEE ARTHROPLASTY Left 02/22/2017   Procedure: LEFT TOTAL KNEE ARTHROPLASTY;  Surgeon: Sheral Apley, MD;  [facility];  Service: Orthopedics;  Laterality: Left;    FAMHx:  Family History  Problem Relation Age of Onset   Cancer Mother    Pneumonia Mother    CAD Father    CAD Brother    Cancer Other    Stroke Neg Hx     SOCHx:   reports that he has never smoked. He has never used smokeless tobacco. He reports that he does not drink alcohol and does not use drugs.  ALLERGIES:  Allergies  Allergen Reactions   Lactose Intolerance (Gi) Other (See Comments)     Gi upset    ROS: Pertinent items noted in HPI and remainder of comprehensive ROS otherwise negative.  HOME MEDS: Current Outpatient Medications on File Prior to Visit  Medication Sig Dispense Refill   aspirin EC 81 MG tablet Take 81 mg by mouth daily. Swallow whole.     atorvastatin (LIPITOR) 20 MG tablet Take 20 mg by mouth daily.     calcium-vitamin D (OSCAL WITH D) 500-200 MG-UNIT TABS tablet Take 1 tablet by mouth in the morning and at bedtime.     DULoxetine (CYMBALTA) 60 MG capsule Take 60 mg by mouth daily.      ibandronate (BONIVA) 150 MG tablet Take 150 mg by mouth every 30 (thirty) days. Take in the morning with a full glass of water, on an empty stomach, and do not take anything else by mouth or lie down for the next 30 min.     levothyroxine (SYNTHROID, LEVOTHROID) 75 MCG tablet Take 75 mcg by mouth daily before breakfast.     losartan (COZAAR) 50 MG tablet Take by mouth.     pantoprazole (PROTONIX) 40 MG tablet Take 40 mg by mouth daily.     pregabalin (LYRICA) 75 MG capsule Take 75 mg by mouth 2 (two) times daily.      torsemide (DEMADEX) 20 MG tablet Take 2 tablets (40 mg total) by mouth daily.     LORazepam (ATIVAN) 0.5 MG tablet Take 0.5 mg by mouth at bedtime. sleep (Patient not taking: Reported on 08/15/2023)     morphine (MSIR) 15 MG tablet Take 15 mg by mouth every 4 (four) hours as needed for severe pain. (Patient not taking: Reported on 08/15/2023)     polyethylene glycol (MIRALAX / GLYCOLAX) 17 g packet Take 17 g by mouth daily as needed for mild constipation. (Patient not taking: Reported on 08/15/2023)     traMADol (ULTRAM) 50 MG tablet Take by mouth every 6 (six) hours as needed. (Patient not taking: Reported on 08/15/2023)     No current facility-administered medications on file prior to visit.    LABS/IMAGING: No results found for this or any previous visit (from the past 48 hours). No results found.  LIPID PANEL:    Component Value Date/Time   CHOL 119  04/24/2021 0616   TRIG 79 04/24/2021 0616   HDL 28 (L) 04/24/2021 0616   CHOLHDL 4.3 04/24/2021 0616   VLDL 16 04/24/2021 0616   LDLCALC 75 04/24/2021 0616     WEIGHTS: Wt Readings from Last 3 Encounters:  08/15/23 249 lb (112.9 kg)  03/08/23 238 lb 4 oz (108.1 kg)  03/18/22 230 lb (104.3 kg)  VITALS: BP 120/64 (BP Location: Right Arm, Patient Position: Sitting, Cuff Size: Large)   Pulse 64   Ht 5\' 7"  (1.702 m)   Wt 249 lb (112.9 kg)   SpO2 94%   BMI 39.00 kg/m   EXAM: General appearance: alert, appears stated age, and no distress Neck: no carotid bruit, no JVD, and thyroid not enlarged, symmetric, no tenderness/mass/nodules Lungs: clear to auscultation bilaterally Heart: regular rate and rhythm Abdomen: soft, non-tender; bowel sounds normal; no masses,  no organomegaly Extremities: extremities normal, atraumatic, no cyanosis or edema Pulses: 2+ and symmetric Skin: Skin color, texture, turgor normal. No rashes or lesions Neurologic: Grossly normal Psych: Pleasant  EKG: EKG Interpretation Date/Time:  Monday August 15 2023 10:39:50 EST Ventricular Rate:  64 PR Interval:  288 QRS Duration:  68 QT Interval:  382 QTC Calculation: 394 R Axis:   -1  Text Interpretation: Sinus rhythm with sinus arrhythmia with 1st degree A-V block When compared with ECG of 24-Apr-2021 09:36, QT has shortened Confirmed by Zoila Shutter [phone]) on 08/15/2023 11:12:03 AM    ASSESSMENT: Dyspnea on exertion-negative PET for ischemia (02/2022) Multivessel coronary calcium by CT Labile hypertension-off medication Dyslipidemia, goal LDL less than 70  PLAN: 1.   Kenneth Brandt seems to be doing well without anginal symptoms.  He does need better risk modification.  LDL continues to be in the upper 70s but would be ideally treated to less than 70.  To that end I would advise increasing his atorvastatin from 20 to 40 mg daily and will order repeat lipids in about 3 months.  Blood pressure seems  to be well-controlled today.  Shortness of breath is improved.  Plan follow-up with annually or sooner as necessary.  Chrystie Nose, MD, [facility], FACP  [place]  [facility]  Medical Director of the [facility] Lipid Disorders &  Cardiovascular Risk Reduction Clinic Diplomate of the American Board of Clinical Lipidology Attending Cardiologist  Direct Dial: [phone]  Fax: [phone]  Website:  [email or web address] [email or web address] 08/15/2023, 11:12 AM

## 2023-08-15 NOTE — Patient Instructions (Signed)
Medication Instructions:  INCREASE atorvastatin to 40mg  once daily  *If you need a refill on your cardiac medications before your next appointment, please call your pharmacy*   Lab Work: FASTING lab work in 3 months to check cholesterol   If you have labs (blood work) drawn today and your tests are completely normal, you will receive your results only by: MyChart Message (if you have MyChart) OR A paper copy in the mail If you have any lab test that is abnormal or we need to change your treatment, we will call you to review the results.    Follow-Up: At [facility], you and your health needs are our priority.  As part of our continuing mission to provide you with exceptional heart care, we have created designated Provider Care Teams.  These Care Teams include your primary Cardiologist (physician) and Advanced Practice Providers (APPs -  Physician Assistants and Nurse Practitioners) who all work together to provide you with the care you need, when you need it.  We recommend signing up for the patient portal called "MyChart".  Sign up information is provided on this After Visit Summary.  MyChart is used to connect with patients for Virtual Visits (Telemedicine).  Patients are able to view lab/test results, encounter notes, upcoming appointments, etc.  Non-urgent messages can be sent to your provider as well.   To learn more about what you can do with MyChart, go to [email or web address].    Your next appointment:   12 months with Dr. Rennis Golden

## 2023-11-17 ENCOUNTER — Other Ambulatory Visit: Payer: Self-pay | Admitting: Internal Medicine

## 2023-11-17 DIAGNOSIS — M810 Age-related osteoporosis without current pathological fracture: Secondary | ICD-10-CM

## 2023-11-25 ENCOUNTER — Other Ambulatory Visit: Payer: Medicare (Managed Care)

## 2023-11-30 ENCOUNTER — Other Ambulatory Visit: Payer: Self-pay | Admitting: Internal Medicine

## 2023-11-30 DIAGNOSIS — M810 Age-related osteoporosis without current pathological fracture: Secondary | ICD-10-CM

## 2023-12-01 ENCOUNTER — Other Ambulatory Visit: Payer: Medicare (Managed Care)

## 2023-12-05 ENCOUNTER — Ambulatory Visit
Admission: RE | Admit: 2023-12-05 | Discharge: 2023-12-05 | Disposition: A | Discharge status: Home / Self Care | Payer: Medicare (Managed Care) | Source: Ambulatory Visit | Attending: Internal Medicine | Admitting: Internal Medicine

## 2023-12-05 DIAGNOSIS — M810 Age-related osteoporosis without current pathological fracture: Secondary | ICD-10-CM

## 2024-08-15 ENCOUNTER — Ambulatory Visit: Payer: Medicare (Managed Care) | Admitting: Internal Medicine

## 2024-08-15 VITALS — BP 130/80 | HR 70 | Ht 67.0 in | Wt 243.0 lb

## 2024-08-15 DIAGNOSIS — I2510 Atherosclerotic heart disease of native coronary artery without angina pectoris: Secondary | ICD-10-CM

## 2024-08-15 DIAGNOSIS — E785 Hyperlipidemia, unspecified: Secondary | ICD-10-CM

## 2024-08-15 DIAGNOSIS — I10 Essential (primary) hypertension: Secondary | ICD-10-CM

## 2024-08-15 NOTE — Patient Instructions (Signed)
 Medication Instructions:  Your physician recommends that you continue on your current medications as directed. Please refer to the Current Medication list given to you today.  *If you need a refill on your cardiac medications before your next appointment, please call your pharmacy*  Lab Work: NONE  If you have labs (blood work) drawn today and your tests are completely normal, you will receive your results only by: MyChart Message (if you have MyChart) OR A paper copy in the mail If you have any lab test that is abnormal or we need to change your treatment, we will call you to review the results.  Testing/Procedures: NONE  Follow-Up: At [facility], you and your health needs are our priority.  As part of our continuing mission to provide you with exceptional heart care, our providers are all part of one team.  This team includes your primary Cardiologist (physician) and Advanced Practice Providers or APPs (Physician Assistants and Nurse Practitioners) who all work together to provide you with the care you need, when you need it.  Your next appointment:   1 year(s)  Provider:   Vinie JAYSON Maxcy, MD or Miriam Shams, NP, Josefa Beauvais, NP, Lum Louis, NP, or Thom Sluder, PA-C        We recommend signing up for the patient portal called MyChart.  Sign up information is provided on this After Visit Summary.  MyChart is used to connect with patients for Virtual Visits (Telemedicine).  Patients are able to view lab/test results, encounter notes, upcoming appointments, etc.  Non-urgent messages can be sent to your provider as well.   To learn more about what you can do with MyChart, go to [email or web address].

## 2024-08-15 NOTE — Progress Notes (Signed)
 OFFICE NOTE  Chief Complaint:  Follow-up  Primary Care Physician: Cloria Annabella CROME, DO  HPI:  Kenneth Brandt is a 89 y.o. male with a past medial history significant for hypertension, dyslipidemia, hypothyroidism and some mild dementia.  He was admitted in August 2022 after an unwitnessed fall.  He was noted to be mildly hypoxic and hypotensive.  He had a prolonged QTc.  Echo showed normal LVEF 65 to 70%.  He was noted to have coronary artery calcification on his chest CT.  Recently he saw Scot Ford, PA-C, who ordered a cardiac PET for work-up of fatigue in the setting of multivessel coronary calcium .  The PET scan performed on 03/09/2022 was normal and considered low risk.  LVEF 59% at rest and 68% with stress.  No TID.  Global myocardial blood flow reserve was normal at 1.98.  He says he is feeling well.  He had had some lower blood pressures but more recently the blood pressure has improved.  This was assessed to be normal today 135/71.  He is not on blood pressure medication.  His last LDL cholesterol last summer was well controlled at 75 on atorvastatin .  08/15/2023  Kenneth Brandt returns today for follow-up.  He seems to be doing well.  Denies any chest pain or shortness of breath.  He is followed at [facility].  He had labs fairly recently which showed total cholesterol 140, triglycerides 121, HDL 41 and LDL 77.  He is on 20 mg atorvastatin .  He was found to have extensive coronary calcium  but no ischemia based on a cardiac PET back in July 2023.  His target LDL should be less than 70.  Looking back it seems like he has been persistently in the upper 70s but has not been quite at target.  Blood pressure is good today 120/64.  He denies any anginal symptoms.  08/15/2024  Kenneth Brandt is seen today in follow-up.  He seems to be doing well.  Denies any chest pain or shortness of breath.  He is followed closely at [facility].  Blood pressure is well-controlled today.  He still  struggling to lose weight.  He has chronic back pain issues.  His cholesterol has been well-controlled and is followed by his primary care provider.  His last stress test was in 2023 which was negative for ischemia although he did have coronary calcification.  PMHx:  Past Medical History:  Diagnosis Date   Anxiety    Arthritis    Chronic back pain    Dementia (HCC)    Depression    Dyspnea    occasionally   Essential tremor    GERD (gastroesophageal reflux disease)    Hepatitis    A -treated   HSV infection    Hypercholesteremia    Hypertension    Hypothyroid    Neuromuscular disorder (HCC)    PAD (peripheral artery disease)    Seasonal allergies    Wears glasses    Wears partial dentures    upper and lower    Past Surgical History:  Procedure Laterality Date   ANKLE FRACTURE SURGERY Right    APPENDECTOMY     remote   BACK SURGERY  2009, 2011   laminectomies   BIOPSY  11/21/2019   Procedure: BIOPSY;  Surgeon: Lennard Lesta FALCON, MD;  Location: [facility];  Service: Endoscopy;;   CARDIOVASCULAR STRESS TEST     04/25/15 Nuclear stress test [facility]): No definite evidence for  inducible ischemia or infarct, EF 74%   CATARACT EXTRACTION Bilateral    CHOLECYSTECTOMY N/A 02/14/2020   Procedure: LAPAROSCOPIC CHOLECYSTECTOMY;  Surgeon: Vanderbilt Ned, MD;  Location: [facility];  Service: General;  Laterality: N/A;   ESOPHAGOGASTRODUODENOSCOPY (EGD) WITH PROPOFOL  N/A 11/21/2019   Procedure: ESOPHAGOGASTRODUODENOSCOPY (EGD) WITH PROPOFOL ;  Surgeon: Lennard Lesta FALCON, MD;  Location: [facility];  Service: Endoscopy;  Laterality: N/A;   IR KYPHO LUMBAR INC FX REDUCE BONE BX UNI/BIL CANNULATION INC/IMAGING  03/14/2018   IR SACROPLASTY BILATERAL  08/18/2018   REPLACEMENT TOTAL KNEE Right 2012   RETINAL DETACHMENT SURGERY Right    TONSILLECTOMY     TOTAL KNEE ARTHROPLASTY Left 02/22/2017   TOTAL KNEE ARTHROPLASTY Left 02/22/2017   Procedure: LEFT TOTAL KNEE ARTHROPLASTY;  Surgeon: Beverley Evalene BIRCH, MD;  Location: [facility];  Service: Orthopedics;  Laterality: Left;    FAMHx:  Family History  Problem Relation Age of Onset   Cancer Mother    Pneumonia Mother    CAD Father    CAD Brother    Cancer Other    Stroke Neg Hx     SOCHx:   reports that he has never smoked. He has never used smokeless tobacco. He reports that he does not drink alcohol and does not use drugs.  ALLERGIES:  Allergies  Allergen Reactions   Lactose Intolerance (Gi) Other (See Comments)    Gi upset    ROS: Pertinent items noted in HPI and remainder of comprehensive ROS otherwise negative.  HOME MEDS: Current Outpatient Medications on File Prior to Visit  Medication Sig Dispense Refill   aspirin  EC 81 MG tablet Take 81 mg by mouth daily. Swallow whole.     atorvastatin  (LIPITOR) 40 MG tablet Take 1 tablet (40 mg total) by mouth daily. 90 tablet 3   calcium -vitamin D (OSCAL WITH D) 500-200 MG-UNIT TABS tablet Take 1 tablet by mouth in the morning and at bedtime.     DULoxetine  (CYMBALTA ) 60 MG capsule Take 60 mg by mouth daily.      ibandronate (BONIVA) 150 MG tablet Take 150 mg by mouth every 30 (thirty) days. Take in the morning with a full glass of water , on an empty stomach, and do not take anything else by mouth or lie down for the next 30 min.     levothyroxine  (SYNTHROID , LEVOTHROID) 75 MCG tablet Take 75 mcg by mouth daily before breakfast.     losartan  (COZAAR ) 50 MG tablet Take by mouth.     pantoprazole  (PROTONIX ) 40 MG tablet Take 40 mg by mouth daily.     pregabalin  (LYRICA ) 75 MG capsule Take 75 mg by mouth 2 (two) times daily.      torsemide  (DEMADEX ) 20 MG tablet Take 2 tablets (40 mg total) by mouth daily.     traMADol (ULTRAM) 50 MG tablet Take by mouth every 6 (six) hours as needed. (Patient not taking: Reported on 08/15/2024)     No current facility-administered medications on file prior to visit.    LABS/IMAGING: No results found for this or any previous visit (from the  past 48 hours). No results found.  LIPID PANEL:    Component Value Date/Time   CHOL 119 04/24/2021 0616   TRIG 79 04/24/2021 0616   HDL 28 (L) 04/24/2021 0616   CHOLHDL 4.3 04/24/2021 0616   VLDL 16 04/24/2021 0616   LDLCALC 75 04/24/2021 0616     WEIGHTS: Wt Readings from Last 3 Encounters:  08/15/24 243 lb (110.2 kg)  08/15/23 249 lb (112.9 kg)  03/08/23 238 lb 4 oz (108.1 kg)    VITALS: BP 130/80 (BP Location: Left Arm, Patient Position: Sitting, Cuff Size: Normal)   Pulse 70   Ht 5' 7 (1.702 m)   Wt 243 lb (110.2 kg)   BMI 38.06 kg/m   EXAM: General appearance: alert, appears stated age, and no distress Neck: no carotid bruit, no JVD, and thyroid  not enlarged, symmetric, no tenderness/mass/nodules Lungs: clear to auscultation bilaterally Heart: regular rate and rhythm Abdomen: soft, non-tender; bowel sounds normal; no masses,  no organomegaly Extremities: extremities normal, atraumatic, no cyanosis or edema Pulses: 2+ and symmetric Skin: Skin color, texture, turgor normal. No rashes or lesions Neurologic: Grossly normal Psych: Pleasant  EKG: EKG Interpretation Date/Time:  Wednesday August 15 2024 09:55:08 EST Ventricular Rate:  65 PR Interval:  286 QRS Duration:  60 QT Interval:  388 QTC Calculation: 403 R Axis:   -29  Text Interpretation: Sinus rhythm with sinus arrhythmia with 1st degree A-V block When compared with ECG of 15-Aug-2023 10:39, Nonspecific T wave abnormality now evident in Inferior leads Confirmed by Mona Kent [phone]) on 08/15/2024 10:00:24 AM    ASSESSMENT: Dyspnea on exertion-negative PET for ischemia (02/2022) Multivessel coronary calcium  by CT Labile hypertension-off medication Dyslipidemia, goal LDL less than 70  PLAN: 1.   Kenneth Brandt continues to do well without any chest pain or shortness of breath with exertion.  Blood pressure is very well-controlled today.  His lipids have been at goal in the past and followed by his  primary care provider.  He did have multivessel coronary calcium  but a negative PET scan in 2023.  He is still somewhat active although limited by low back pain.  I would continue to encourage him to work on remaining active and losing weight if possible.  Plan follow-up with me or APP in 1 year or sooner as necessary  Kent KYM Mona, MD, [facility], FNLA, FACP  [facility]  Medical Director of the [facility] Diplomate of the American Board of Clinical Lipidology Attending Cardiologist  Direct Dial: [phone]  Fax: [phone]  Website:  [email or web address]   Kent BROCKS Nils Thor 08/15/2024, 10:00 AM
